# Patient Record
Sex: Female | Born: 1960
Health system: Southern US, Community
[De-identification: ages and names within clinical notes are randomized; demographics above are authoritative.]

## PROBLEM LIST (undated history)

## (undated) DIAGNOSIS — R0600 Dyspnea, unspecified: Secondary | ICD-10-CM

## (undated) DIAGNOSIS — E7800 Pure hypercholesterolemia, unspecified: Secondary | ICD-10-CM

## (undated) DIAGNOSIS — H269 Unspecified cataract: Secondary | ICD-10-CM

## (undated) DIAGNOSIS — I10 Essential (primary) hypertension: Secondary | ICD-10-CM

## (undated) DIAGNOSIS — E039 Hypothyroidism, unspecified: Secondary | ICD-10-CM

## (undated) DIAGNOSIS — N289 Disorder of kidney and ureter, unspecified: Secondary | ICD-10-CM

## (undated) DIAGNOSIS — E079 Disorder of thyroid, unspecified: Secondary | ICD-10-CM

## (undated) DIAGNOSIS — M1990 Unspecified osteoarthritis, unspecified site: Secondary | ICD-10-CM

## (undated) DIAGNOSIS — L4050 Arthropathic psoriasis, unspecified: Secondary | ICD-10-CM

## (undated) DIAGNOSIS — C801 Malignant (primary) neoplasm, unspecified: Secondary | ICD-10-CM

## (undated) DIAGNOSIS — E119 Type 2 diabetes mellitus without complications: Secondary | ICD-10-CM

## (undated) DIAGNOSIS — D649 Anemia, unspecified: Secondary | ICD-10-CM

## (undated) DIAGNOSIS — F319 Bipolar disorder, unspecified: Secondary | ICD-10-CM

## (undated) DIAGNOSIS — L409 Psoriasis, unspecified: Secondary | ICD-10-CM

## (undated) HISTORY — PX: OTHER SURGICAL HISTORY: SHX169

## (undated) HISTORY — PX: TONSILLECTOMY: SUR1361

---

## 2010-05-05 ENCOUNTER — Inpatient Hospital Stay: Payer: Self-pay | Admitting: Podiatry

## 2010-05-08 LAB — PATHOLOGY REPORT

## 2011-03-04 ENCOUNTER — Encounter: Payer: Self-pay | Admitting: Nurse Practitioner

## 2011-03-19 ENCOUNTER — Encounter: Payer: Self-pay | Admitting: Nurse Practitioner

## 2015-04-27 ENCOUNTER — Emergency Department
Admission: EM | Admit: 2015-04-27 | Discharge: 2015-04-27 | Disposition: A | Discharge status: Home / Self Care | Payer: Medicare Other | Attending: Emergency Medicine | Admitting: Emergency Medicine

## 2015-04-27 ENCOUNTER — Emergency Department: Payer: Medicare Other

## 2015-04-27 ENCOUNTER — Encounter: Payer: Self-pay | Admitting: *Deleted

## 2015-04-27 DIAGNOSIS — Z880 Allergy status to penicillin: Secondary | ICD-10-CM | POA: Diagnosis not present

## 2015-04-27 DIAGNOSIS — Z79899 Other long term (current) drug therapy: Secondary | ICD-10-CM | POA: Diagnosis not present

## 2015-04-27 DIAGNOSIS — M25562 Pain in left knee: Secondary | ICD-10-CM | POA: Diagnosis present

## 2015-04-27 DIAGNOSIS — M25462 Effusion, left knee: Secondary | ICD-10-CM | POA: Insufficient documentation

## 2015-04-27 DIAGNOSIS — I10 Essential (primary) hypertension: Secondary | ICD-10-CM | POA: Diagnosis not present

## 2015-04-27 DIAGNOSIS — E119 Type 2 diabetes mellitus without complications: Secondary | ICD-10-CM | POA: Diagnosis not present

## 2015-04-27 DIAGNOSIS — Z791 Long term (current) use of non-steroidal anti-inflammatories (NSAID): Secondary | ICD-10-CM | POA: Diagnosis not present

## 2015-04-27 HISTORY — DX: Psoriasis, unspecified: L40.9

## 2015-04-27 HISTORY — DX: Disorder of thyroid, unspecified: E07.9

## 2015-04-27 HISTORY — DX: Unspecified osteoarthritis, unspecified site: M19.90

## 2015-04-27 HISTORY — DX: Essential (primary) hypertension: I10

## 2015-04-27 HISTORY — DX: Type 2 diabetes mellitus without complications: E11.9

## 2015-04-27 HISTORY — DX: Bipolar disorder, unspecified: F31.9

## 2015-04-27 HISTORY — DX: Pure hypercholesterolemia, unspecified: E78.00

## 2015-04-27 MED ORDER — KETOROLAC TROMETHAMINE 60 MG/2ML IM SOLN
60.0000 mg | Freq: Once | INTRAMUSCULAR | Status: AC
  Administered 2015-04-27: 60 mg via INTRAMUSCULAR

## 2015-04-27 MED ORDER — KETOROLAC TROMETHAMINE 60 MG/2ML IM SOLN
INTRAMUSCULAR | Status: AC
  Administered 2015-04-27: 60 mg via INTRAMUSCULAR
  Filled 2015-04-27: qty 2

## 2015-04-27 MED ORDER — OXYCODONE-ACETAMINOPHEN 5-325 MG PO TABS
1.0000 | ORAL_TABLET | Freq: Once | ORAL | Status: AC
  Administered 2015-04-27: 1 via ORAL

## 2015-04-27 MED ORDER — OXYCODONE-ACETAMINOPHEN 5-325 MG PO TABS
ORAL_TABLET | ORAL | Status: AC
  Filled 2015-04-27: qty 1

## 2015-04-27 MED ORDER — ETODOLAC 200 MG PO CAPS: 200.0000 mg | ORAL_CAPSULE | Freq: Three times a day (TID) | ORAL | Status: DC

## 2015-04-27 NOTE — ED Notes (Signed)
Pt c/o exacerbation of chronic knee pain.

## 2015-04-27 NOTE — Discharge Instructions (Signed)
Knee Effusion The medical term for having fluid in your knee is effusion. This is often due to an internal derangement of the knee. This means something is wrong inside the knee. Some of the causes of fluid in the knee may be torn cartilage, a torn ligament, or bleeding into the joint from an injury. Your knee is likely more difficult to bend and move. This is often because there is increased pain and pressure in the joint. The time it takes for recovery from a knee effusion depends on different factors, including:   Type of injury.  Your age.  Physical and medical conditions.  Rehabilitation Strategies. How long you will be away from your normal activities will depend on what kind of knee problem you have and how much damage is present. Your knee has two types of cartilage. Articular cartilage covers the bone ends and lets your knee bend and move smoothly. Two menisci, thick pads of cartilage that form a rim inside the joint, help absorb shock and stabilize your knee. Ligaments bind the bones together and support your knee joint. Muscles move the joint, help support your knee, and take stress off the joint itself. CAUSES  Often an effusion in the knee is caused by an injury to one of the menisci. This is often a tear in the cartilage. Recovery after a meniscus injury depends on how much meniscus is damaged and whether you have damaged other knee tissue. Small tears may heal on their own with conservative treatment. Conservative means rest, limited weight bearing activity and muscle strengthening exercises. Your recovery may take up to 6 weeks.  TREATMENT  Larger tears may require surgery. Meniscus injuries may be treated during arthroscopy. Arthroscopy is a procedure in which your surgeon uses a small telescope like instrument to look in your knee. Your caregiver can make a more accurate diagnosis (learning what is wrong) by performing an arthroscopic procedure. If your injury is on the inner margin  of the meniscus, your surgeon may trim the meniscus back to a smooth rim. In other cases your surgeon will try to repair a damaged meniscus with stitches (sutures). This may make rehabilitation take longer, but may provide better long term result by helping your knee keep its shock absorption capabilities. Ligaments which are completely torn usually require surgery for repair. HOME CARE INSTRUCTIONS  Use crutches as instructed.  If a brace is applied, use as directed.  Once you are home, an ice pack applied to your swollen knee may help with discomfort and help decrease swelling.  Keep your knee raised (elevated) when you are not up and around or on crutches.  Only take over-the-counter or prescription medicines for pain, discomfort, or fever as directed by your caregiver.  Your caregivers will help with instructions for rehabilitation of your knee. This often includes strengthening exercises.  You may resume a normal diet and activities as directed. SEEK MEDICAL CARE IF:   There is increased swelling in your knee.  You notice redness, swelling, or increasing pain in your knee.  An unexplained oral temperature above 102 F (38.9 C) develops. SEEK IMMEDIATE MEDICAL CARE IF:   You develop a rash.  You have difficulty breathing.  You have any allergic reactions from medications you may have been given.  There is severe pain with any motion of the knee. MAKE SURE YOU:   Understand these instructions.  Will watch your condition.  Will get help right away if you are not doing well or get worse.  Document Released: 12/25/2003 Document Revised: 12/27/2011 Document Reviewed: 02/28/2008 [facility] Patient Information 2015 [place], [place]. This information is not intended to replace advice given to you by your health care provider. Make sure you discuss any questions you have with your health care provider.  Knee Pain The knee is the complex joint between your thigh and your lower leg.  It is made up of bones, tendons, ligaments, and cartilage. The bones that make up the knee are:  The femur in the thigh.  The tibia and fibula in the lower leg.  The patella or kneecap riding in the groove on the lower femur. CAUSES  Knee pain is a common complaint with many causes. A few of these causes are:  Injury, such as:  A ruptured ligament or tendon injury.  Torn cartilage.  Medical conditions, such as:  Gout  Arthritis  Infections  Overuse, over training, or overdoing a physical activity. Knee pain can be minor or severe. Knee pain can accompany debilitating injury. Minor knee problems often respond well to self-care measures or get well on their own. More serious injuries may need medical intervention or even surgery. SYMPTOMS The knee is complex. Symptoms of knee problems can vary widely. Some of the problems are:  Pain with movement and weight bearing.  Swelling and tenderness.  Buckling of the knee.  Inability to straighten or extend your knee.  Your knee locks and you cannot straighten it.  Warmth and redness with pain and fever.  Deformity or dislocation of the kneecap. DIAGNOSIS  Determining what is wrong may be very straight forward such as when there is an injury. It can also be challenging because of the complexity of the knee. Tests to make a diagnosis may include:  Your caregiver taking a history and doing a physical exam.  Routine X-rays can be used to rule out other problems. X-rays will not reveal a cartilage tear. Some injuries of the knee can be diagnosed by:  Arthroscopy a surgical technique by which a small video camera is inserted through tiny incisions on the sides of the knee. This procedure is used to examine and repair internal knee joint problems. Tiny instruments can be used during arthroscopy to repair the torn knee cartilage (meniscus).  Arthrography is a radiology technique. A contrast liquid is directly injected into the knee  joint. Internal structures of the knee joint then become visible on X-ray film.  An MRI scan is a non X-ray radiology procedure in which magnetic fields and a computer produce two- or three-dimensional images of the inside of the knee. Cartilage tears are often visible using an MRI scanner. MRI scans have largely replaced arthrography in diagnosing cartilage tears of the knee.  Blood work.  Examination of the fluid that helps to lubricate the knee joint (synovial fluid). This is done by taking a sample out using a needle and a syringe. TREATMENT The treatment of knee problems depends on the cause. Some of these treatments are:  Depending on the injury, proper casting, splinting, surgery, or physical therapy care will be needed.  Give yourself adequate recovery time. Do not overuse your joints. If you begin to get sore during workout routines, back off. Slow down or do fewer repetitions.  For repetitive activities such as cycling or running, maintain your strength and nutrition.  Alternate muscle groups. For example, if you are a weight lifter, work the upper body on one day and the lower body the next.  Either tight or weak muscles do not  give the proper support for your knee. Tight or weak muscles do not absorb the stress placed on the knee joint. Keep the muscles surrounding the knee strong.  Take care of mechanical problems.  If you have flat feet, orthotics or special shoes may help. See your caregiver if you need help.  Arch supports, sometimes with wedges on the inner or outer aspect of the heel, can help. These can shift pressure away from the side of the knee most bothered by osteoarthritis.  A brace called an "unloader" brace also may be used to help ease the pressure on the most arthritic side of the knee.  If your caregiver has prescribed crutches, braces, wraps or ice, use as directed. The acronym for this is PRICE. This means protection, rest, ice, compression, and  elevation.  Nonsteroidal anti-inflammatory drugs (NSAIDs), can help relieve pain. But if taken immediately after an injury, they may actually increase swelling. Take NSAIDs with food in your stomach. Stop them if you develop stomach problems. Do not take these if you have a history of ulcers, stomach pain, or bleeding from the bowel. Do not take without your caregiver's approval if you have problems with fluid retention, heart failure, or kidney problems.  For ongoing knee problems, physical therapy may be helpful.  Glucosamine and chondroitin are over-the-counter dietary supplements. Both may help relieve the pain of osteoarthritis in the knee. These medicines are different from the usual anti-inflammatory drugs. Glucosamine may decrease the rate of cartilage destruction.  Injections of a corticosteroid drug into your knee joint may help reduce the symptoms of an arthritis flare-up. They may provide pain relief that lasts a few months. You may have to wait a few months between injections. The injections do have a small increased risk of infection, water retention, and elevated blood sugar levels.  Hyaluronic acid injected into damaged joints may ease pain and provide lubrication. These injections may work by reducing inflammation. A series of shots may give relief for as long as 6 months.  Topical painkillers. Applying certain ointments to your skin may help relieve the pain and stiffness of osteoarthritis. Ask your pharmacist for suggestions. Many over the-counter products are approved for temporary relief of arthritis pain.  In some countries, doctors often prescribe topical NSAIDs for relief of chronic conditions such as arthritis and tendinitis. A review of treatment with NSAID creams found that they worked as well as oral medications but without the serious side effects. PREVENTION  Maintain a healthy weight. Extra pounds put more strain on your joints.  Get strong, stay limber. Weak muscles  are a common cause of knee injuries. Stretching is important. Include flexibility exercises in your workouts.  Be smart about exercise. If you have osteoarthritis, chronic knee pain or recurring injuries, you may need to change the way you exercise. This does not mean you have to stop being active. If your knees ache after jogging or playing basketball, consider switching to swimming, water aerobics, or other low-impact activities, at least for a few days a week. Sometimes limiting high-impact activities will provide relief.  Make sure your shoes fit well. Choose footwear that is right for your sport.  Protect your knees. Use the proper gear for knee-sensitive activities. Use kneepads when playing volleyball or laying carpet. Buckle your seat belt every time you drive. Most shattered kneecaps occur in car accidents.  Rest when you are tired. SEEK MEDICAL CARE IF:  You have knee pain that is continual and does not seem to be  getting better.  SEEK IMMEDIATE MEDICAL CARE IF:  Your knee joint feels hot to the touch and you have a high fever. MAKE SURE YOU:   Understand these instructions.  Will watch your condition.  Will get help right away if you are not doing well or get worse. Document Released: 08/01/2007 Document Revised: 12/27/2011 Document Reviewed: 08/01/2007 [facility] Patient Information 2015 [place], [place]. This information is not intended to replace advice given to you by your health care provider. Make sure you discuss any questions you have with your health care provider.

## 2015-04-27 NOTE — ED Notes (Signed)
No acute distress at discharge.  Pt advised about pain management and prescription for pain medication.  PT verbalized understanding.

## 2015-04-27 NOTE — ED Provider Notes (Signed)
[facility] Emergency Department Provider Note  ____________________________________________  Time seen: Approximately 6:07 AM  I have reviewed the triage vital signs and the nursing notes.   HISTORY  Chief Complaint Knee Pain    HPI Stacey Ball is a 54 y.o. female who comes in with left knee pain. The patient reports that her knee Dislocates often. She reports it goes out of place often for several months. She reports that yesterday when out of place and she was unable to put it back into place. The patient reports that the pain in her knee is approximately a 5 out of 10 in intensity. She reports that she has psoriatic arthritis and some problems with her cartilage. She reports that she went to the orthopedic doctor approximately one month ago and was seen by the PA who told her that her knee was okay. She reports that she is unable to bear weight on that left knee and is having increasing pain. The patient came in for further evaluation of her knee pain.   Past Medical History  Diagnosis Date  . Arthritis   . Bipolar 1 disorder   . Thyroid disease   . Psoriasis   . Hypertension   . Diabetes mellitus without complication   . High cholesterol     There are no active problems to display for this patient.   History reviewed. No pertinent past surgical history.  Current Outpatient Rx  Name  Route  Sig  Dispense  Refill  . divalproex (DEPAKOTE) 500 MG DR tablet   Oral   Take 500 mg by mouth 2 (two) times daily.         Marland Kitchen levothyroxine (SYNTHROID, LEVOTHROID) 25 MCG tablet   Oral   Take 25 mcg by mouth daily before breakfast.         . metFORMIN (GLUCOPHAGE) 500 MG tablet   Oral   Take by mouth 3 (three) times daily.         . QUEtiapine (SEROQUEL) 300 MG tablet   Oral   Take 300 mg by mouth at bedtime.         Marland Kitchen etodolac (LODINE) 200 MG capsule   Oral   Take 1 capsule (200 mg total) by mouth every 8 (eight) hours.   30 capsule  0     Allergies Penicillins and Shellfish allergy  History reviewed. No pertinent family history.  Social History History  Substance Use Topics  . Smoking status: Passive Smoke Exposure - Never Smoker  . Smokeless tobacco: Never Used  . Alcohol Use: No    Review of Systems Constitutional: No fever/chills Eyes: No visual changes. ENT: No sore throat. Cardiovascular: Denies chest pain. Respiratory: Denies shortness of breath. Gastrointestinal: No abdominal pain.  No nausea, no vomiting.  No diarrhea.  No constipation. Genitourinary: Negative for dysuria. Musculoskeletal: Left knee pain and swelling Skin: Negative for rash. Neurological: Negative for headaches, focal weakness or numbness.  10-point ROS otherwise negative.  ____________________________________________   PHYSICAL EXAM:  VITAL SIGNS: ED Triage Vitals  Enc Vitals Group     BP 04/27/15 0130 144/82 mmHg     Pulse Rate 04/27/15 0130 85     Resp 04/27/15 0130 18     Temp 04/27/15 0130 98 F (36.7 C)     Temp Source 04/27/15 0130 Oral     SpO2 04/27/15 0130 100 %     Weight 04/27/15 0130 180 lb (81.647 kg)     Height 04/27/15 0130 5'  7" (1.702 m)     Head Cir --      Peak Flow --      Pain Score 04/27/15 0134 5     Pain Loc --      Pain Edu? --      Excl. in [place]? --     Constitutional: Alert and oriented. Well appearing and in moderate distress. Eyes: Conjunctivae are normal. PERRL. EOMI. Head: Atraumatic. Nose: No congestion/rhinnorhea. Mouth/Throat: Mucous membranes are moist.  Oropharynx non-erythematous. Cardiovascular: Normal rate, regular rhythm. Grossly normal heart sounds.  Good peripheral circulation. Respiratory: Normal respiratory effort.  No retractions. Lungs CTAB. Gastrointestinal: Soft and nontender. No distention. Positive bowel sounds Genitourinary: Deferred Musculoskeletal: Left knee pain with a moderate joint effusion. The patient has pain with valgus and varus stressing with no  instability. Neurologic:  Normal speech and language. No gross focal neurologic deficits are appreciated.  Skin:  Skin is warm, dry and intact. No rash noted. Psychiatric: Mood and affect are normal.   ____________________________________________   LABS (all labs ordered are listed, but only abnormal results are displayed)  Labs Reviewed - No data to display ____________________________________________  EKG  None ____________________________________________  RADIOLOGY  Left knee x-ray: Moderate suprapatellar joint effusion without fracture deformity or dislocation, mild medial compartment osteoarthritis ____________________________________________   PROCEDURES  Procedure(s) performed: None  Critical Care performed: No  ____________________________________________   INITIAL IMPRESSION / ASSESSMENT AND PLAN / ED COURSE  Pertinent labs & imaging results that were available during my care of the patient were reviewed by me and considered in my medical decision making (see chart for details).  This is a 54 year old female who has a history of chronic knee pain and psoriatic arthritis who comes in with some knee pain and swelling today. I did do an x-ray looking for a possible patellar dislocation which was not found on x-ray. The patient does have some effusion and osteoarthrosis which may be causing her pain. The patient received a dose of Percocet and a shot of Toradol. I will give her some etodolac for home and have her follow back up with orthopedic surgery. The patient agrees with this plan as stated. ____________________________________________   FINAL CLINICAL IMPRESSION(S) / ED DIAGNOSES  Final diagnoses:  Knee pain, acute, left  Knee effusion, left      Loney Hering, MD 04/27/15 [id number]

## 2015-04-27 NOTE — ED Notes (Signed)
Pt reports pain to left knee, r/t psoriatic arthritis.  Pt reports hx subluxation of the patella to the left knee.  Pt reports yesterday patella came out of place about 6 hours ago and won't go back in place like before.  Pt states she is unable to walk and not able to bear much weight.  Pt NAD at this time.

## 2016-03-03 ENCOUNTER — Encounter: Payer: Self-pay | Admitting: *Deleted

## 2016-03-04 ENCOUNTER — Encounter
Admission: RE | Disposition: A | Discharge status: Home / Self Care | Payer: Self-pay | Source: Ambulatory Visit | Attending: Unknown Physician Specialty

## 2016-03-04 ENCOUNTER — Ambulatory Visit: Payer: Medicare Other | Admitting: Anesthesiology

## 2016-03-04 ENCOUNTER — Ambulatory Visit
Admission: RE | Admit: 2016-03-04 | Discharge: 2016-03-04 | Disposition: A | Discharge status: Home / Self Care | Payer: Medicare Other | Source: Ambulatory Visit | Attending: Unknown Physician Specialty | Admitting: Unknown Physician Specialty

## 2016-03-04 DIAGNOSIS — I10 Essential (primary) hypertension: Secondary | ICD-10-CM | POA: Diagnosis not present

## 2016-03-04 DIAGNOSIS — M1990 Unspecified osteoarthritis, unspecified site: Secondary | ICD-10-CM | POA: Diagnosis not present

## 2016-03-04 DIAGNOSIS — L409 Psoriasis, unspecified: Secondary | ICD-10-CM | POA: Diagnosis not present

## 2016-03-04 DIAGNOSIS — E7800 Pure hypercholesterolemia, unspecified: Secondary | ICD-10-CM | POA: Diagnosis not present

## 2016-03-04 DIAGNOSIS — K640 First degree hemorrhoids: Secondary | ICD-10-CM | POA: Insufficient documentation

## 2016-03-04 DIAGNOSIS — F319 Bipolar disorder, unspecified: Secondary | ICD-10-CM | POA: Diagnosis not present

## 2016-03-04 DIAGNOSIS — Z79899 Other long term (current) drug therapy: Secondary | ICD-10-CM | POA: Diagnosis not present

## 2016-03-04 DIAGNOSIS — K635 Polyp of colon: Secondary | ICD-10-CM | POA: Insufficient documentation

## 2016-03-04 DIAGNOSIS — Z1211 Encounter for screening for malignant neoplasm of colon: Secondary | ICD-10-CM | POA: Diagnosis not present

## 2016-03-04 DIAGNOSIS — Z7984 Long term (current) use of oral hypoglycemic drugs: Secondary | ICD-10-CM | POA: Diagnosis not present

## 2016-03-04 DIAGNOSIS — E119 Type 2 diabetes mellitus without complications: Secondary | ICD-10-CM | POA: Insufficient documentation

## 2016-03-04 DIAGNOSIS — E039 Hypothyroidism, unspecified: Secondary | ICD-10-CM | POA: Diagnosis not present

## 2016-03-04 HISTORY — DX: Unspecified cataract: H26.9

## 2016-03-04 HISTORY — DX: Hypothyroidism, unspecified: E03.9

## 2016-03-04 HISTORY — PX: COLONOSCOPY WITH PROPOFOL: SHX5780

## 2016-03-04 LAB — GLUCOSE, CAPILLARY: GLUCOSE-CAPILLARY: 120 mg/dL — AB (ref 65–99)

## 2016-03-04 SURGERY — COLONOSCOPY WITH PROPOFOL
Anesthesia: General

## 2016-03-04 MED ORDER — SODIUM CHLORIDE 0.9 % IV SOLN: INTRAVENOUS | Status: DC

## 2016-03-04 MED ORDER — PROPOFOL 10 MG/ML IV BOLUS
INTRAVENOUS | Status: DC | PRN
  Administered 2016-03-04: 50 mg via INTRAVENOUS
  Administered 2016-03-04: 10 mg via INTRAVENOUS

## 2016-03-04 MED ORDER — MIDAZOLAM HCL 5 MG/5ML IJ SOLN
INTRAMUSCULAR | Status: DC | PRN
  Administered 2016-03-04: 1 mg via INTRAVENOUS

## 2016-03-04 MED ORDER — SODIUM CHLORIDE 0.9 % IV SOLN
INTRAVENOUS | Status: DC
  Administered 2016-03-04: 1000 mL via INTRAVENOUS

## 2016-03-04 MED ORDER — LIDOCAINE HCL (PF) 2 % IJ SOLN
INTRAMUSCULAR | Status: DC | PRN
  Administered 2016-03-04: 60 mg

## 2016-03-04 MED ORDER — PROPOFOL 500 MG/50ML IV EMUL
INTRAVENOUS | Status: DC | PRN
  Administered 2016-03-04: 10 ug/kg/min via INTRAVENOUS

## 2016-03-04 MED ORDER — FENTANYL CITRATE (PF) 100 MCG/2ML IJ SOLN
INTRAMUSCULAR | Status: DC | PRN
  Administered 2016-03-04: 50 ug via INTRAVENOUS

## 2016-03-04 NOTE — Op Note (Signed)
[facility] Gastroenterology Patient Name: Stacey Ball Procedure Date: 03/04/2016 2:43 PM MRN: [id number] Account #: [id number] Date of Birth: [birth year] Admit Type: Outpatient Age: 55 Room: [facility] ENDO ROOM 1 Gender: Female Note Status: Finalized Procedure:            Colonoscopy Indications:          Screening for colorectal malignant neoplasm Providers:            Manya Silvas, MD Referring MD:         Dion Body (Referring MD) Medicines:            Propofol per Anesthesia Complications:        No immediate complications. Procedure:            Pre-Anesthesia Assessment:                       - After reviewing the risks and benefits, the patient                        was deemed in satisfactory condition to undergo the                        procedure.                       After obtaining informed consent, the colonoscope was                        passed under direct vision. Throughout the procedure,                        the patient's blood pressure, pulse, and oxygen                        saturations were monitored continuously. The                        Colonoscope was introduced through the anus and                        advanced to the the cecum, identified by appendiceal                        orifice and ileocecal valve. The colonoscopy was                        performed without difficulty. The patient tolerated the                        procedure well. The quality of the bowel preparation                        was good. Findings:      A diminutive polyp was found in the sigmoid colon. The polyp was       sessile. The polyp was removed with a cold snare. Resection and       retrieval were complete.      Internal hemorrhoids were found during endoscopy. The hemorrhoids were       small and Grade I (internal hemorrhoids that do not prolapse).      The exam was otherwise without  abnormality. Impression:           - One diminutive  polyp in the sigmoid colon, removed                        with a cold snare. Resected and retrieved.                       - Internal hemorrhoids.                       - The examination was otherwise normal. Recommendation:       - Await pathology results. Manya Silvas, MD 03/04/2016 3:29:26 PM This report has been signed electronically. Number of Addenda: 0 Note Initiated On: 03/04/2016 2:43 PM Scope Withdrawal Time: 0 hours 11 minutes 12 seconds  Total Procedure Duration: 0 hours 26 minutes 37 seconds       [facility]

## 2016-03-04 NOTE — H&P (Signed)
Primary Care Physician:  Dion Body, MD Primary Gastroenterologist:  Dr. Vira Agar  Pre-Procedure History & Physical: HPI:  Stacey Ball is a 55 y.o. female is here for an colonoscopy.   Past Medical History  Diagnosis Date  . Arthritis   . Bipolar 1 disorder ([place])   . Thyroid disease   . Psoriasis   . Hypertension   . Diabetes mellitus without complication ([place])   . High cholesterol   . Hypothyroidism   . Cataracts, bilateral     Past Surgical History  Procedure Laterality Date  . Tonsillectomy    . Matrixectomy    . Great toe amputation      Prior to Admission medications   Medication Sig Start Date End Date Taking? Authorizing Provider  clonazePAM (KLONOPIN) 2 MG tablet Take 2 mg by mouth 2 (two) times daily.   Yes Historical Provider, MD  diclofenac sodium (VOLTAREN) 1 % GEL Apply topically 4 (four) times daily.   Yes Historical Provider, MD  divalproex (DEPAKOTE) 500 MG DR tablet Take 500 mg by mouth 2 (two) times daily.   Yes Historical Provider, MD  etodolac (LODINE) 200 MG capsule Take 1 capsule (200 mg total) by mouth every 8 (eight) hours. 04/27/15  Yes Loney Hering, MD  fluocinonide ointment (LIDEX) [id number] % Apply 1 application topically 2 (two) times daily.   Yes Historical Provider, MD  folic acid (FOLVITE) 1 MG tablet Take 1 mg by mouth daily.   Yes Historical Provider, MD  levothyroxine (SYNTHROID, LEVOTHROID) 25 MCG tablet Take 25 mcg by mouth daily before breakfast.   Yes Historical Provider, MD  losartan (COZAAR) 25 MG tablet Take 25 mg by mouth daily.   Yes Historical Provider, MD  traMADol (ULTRAM) 50 MG tablet Take by mouth every 6 (six) hours as needed.   Yes Historical Provider, MD  metFORMIN (GLUCOPHAGE) 500 MG tablet Take by mouth 3 (three) times daily.    Historical Provider, MD  QUEtiapine (SEROQUEL) 300 MG tablet Take 300 mg by mouth at bedtime.    Historical Provider, MD    Allergies as of 02/26/2016 - Review Complete 04/27/2015   Allergen Reaction Noted  . Penicillins  04/27/2015  . Shellfish allergy  04/27/2015    No family history on file.  Social History   Social History  . Marital Status: Married    Spouse Name: N/A  . Number of Children: N/A  . Years of Education: N/A   Occupational History  . Not on file.   Social History Main Topics  . Smoking status: Passive Smoke Exposure - Never Smoker  . Smokeless tobacco: Never Used  . Alcohol Use: No  . Drug Use: No  . Sexual Activity: No   Other Topics Concern  . Not on file   Social History Narrative    Review of Systems: See HPI, otherwise negative ROS  Physical Exam: BP 141/86 mmHg  Pulse 91  Temp(Src) 98.5 F (36.9 C) (Tympanic)  Ht 5\' 7"  (1.702 m)  Wt 81.647 kg (180 lb)  BMI 28.19 kg/m2  SpO2 100% General:   Alert,  pleasant and cooperative in NAD Head:  Normocephalic and atraumatic. Neck:  Supple; no masses or thyromegaly. Lungs:  Clear throughout to auscultation.    Heart:  Regular rate and rhythm. Abdomen:  Soft, nontender and nondistended. Normal bowel sounds, without guarding, and without rebound.   Neurologic:  Alert and  oriented x4;  grossly normal neurologically.  Impression/Plan: Stacey Ball is here for  an colonoscopy to be performed for screening colonoscopy  Risks, benefits, limitations, and alternatives regarding  colonoscopy have been reviewed with the patient.  Questions have been answered.  All parties agreeable.   Gaylyn Cheers, MD  03/04/2016, 2:53 PM

## 2016-03-04 NOTE — Transfer of Care (Signed)
Immediate Anesthesia Transfer of Care Note  Patient: Stacey Ball  Procedure(s) Performed: Procedure(s): COLONOSCOPY WITH PROPOFOL (N/A)  Patient Location: PACU  Anesthesia Type:General  Level of Consciousness: awake and patient cooperative  Airway & Oxygen Therapy: Patient Spontanous Breathing and Patient connected to nasal cannula oxygen  Post-op Assessment: Report given to RN  Post vital signs: Reviewed and stable  Last Vitals:  Filed Vitals:   03/04/16 1410 03/04/16 1533  BP: 141/86 143/87  Pulse: 91 86  Temp: 36.9 C 97.22F  Resp:  22    Last Pain: There were no vitals filed for this visit.       Complications: No apparent anesthesia complications

## 2016-03-04 NOTE — Anesthesia Preprocedure Evaluation (Addendum)
Anesthesia Evaluation  Patient identified by MRN, date of birth, ID band Patient awake    Reviewed: Allergy & Precautions, H&P , NPO status , Patient's Chart, lab work & pertinent test results, reviewed documented beta blocker date and time   Airway Mallampati: II   Neck ROM: full    Dental  (+) Poor Dentition   Pulmonary neg pulmonary ROS,    Pulmonary exam normal        Cardiovascular hypertension, negative cardio ROS Normal cardiovascular exam     Neuro/Psych negative neurological ROS  negative psych ROS   GI/Hepatic negative GI ROS, Neg liver ROS,   Endo/Other  negative endocrine ROSdiabetes  Renal/GU negative Renal ROS  negative genitourinary   Musculoskeletal   Abdominal   Peds  Hematology negative hematology ROS (+)   Anesthesia Other Findings Past Medical History:   Arthritis                                                    Bipolar 1 disorder ([place])                                     Thyroid disease                                              Psoriasis                                                    Hypertension                                                 Diabetes mellitus without complication (HCC)                 High cholesterol                                             Hypothyroidism                                               Cataracts, bilateral                                       Past Surgical History:   TONSILLECTOMY                                                 matrixectomy  great toe amputation                                          COLONOSCOPY WITH PROPOFOL                       N/A 03/04/2016      Comment:Procedure: COLONOSCOPY WITH PROPOFOL;  Surgeon:              Manya Silvas, MD;  Location: [facility]               ENDOSCOPY;  Service: Endoscopy;  Laterality:               N/A; BMI    Body Mass Index   28.18 kg/m 2     Reproductive/Obstetrics                             Anesthesia Physical Anesthesia Plan  ASA: III  Anesthesia Plan: General   Post-op Pain Management:    Induction:   Airway Management Planned:   Additional Equipment:   Intra-op Plan:   Post-operative Plan:   Informed Consent: I have reviewed the patients History and Physical, chart, labs and discussed the procedure including the risks, benefits and alternatives for the proposed anesthesia with the patient or authorized representative who has indicated his/her understanding and acceptance.   Dental Advisory Given  Plan Discussed with: CRNA  Anesthesia Plan Comments:         Anesthesia Quick Evaluation

## 2016-03-08 LAB — SURGICAL PATHOLOGY

## 2016-03-09 ENCOUNTER — Encounter: Payer: Self-pay | Admitting: Unknown Physician Specialty

## 2016-03-10 NOTE — Anesthesia Postprocedure Evaluation (Signed)
Anesthesia Post Note  Patient: Stacey Ball  Procedure(s) Performed: Procedure(s) (LRB): COLONOSCOPY WITH PROPOFOL (N/A)  Patient location during evaluation: PACU Anesthesia Type: General Level of consciousness: awake Pain management: pain level controlled Vital Signs Assessment: post-procedure vital signs reviewed and stable Respiratory status: spontaneous breathing Cardiovascular status: blood pressure returned to baseline Anesthetic complications: no    Last Vitals:  Filed Vitals:   03/04/16 1550 03/04/16 1600  BP: 174/99 156/90  Pulse: 95 94  Temp:    Resp: 16 19    Last Pain:  Filed Vitals:   03/05/16 0756  PainSc: 0-No pain                 VAN STAVEREN,Amily Depp

## 2016-03-17 DIAGNOSIS — I10 Essential (primary) hypertension: Secondary | ICD-10-CM | POA: Diagnosis present

## 2016-03-22 DIAGNOSIS — E781 Pure hyperglyceridemia: Secondary | ICD-10-CM | POA: Insufficient documentation

## 2016-10-04 DIAGNOSIS — N183 Chronic kidney disease, stage 3 unspecified: Secondary | ICD-10-CM | POA: Insufficient documentation

## 2016-10-20 DIAGNOSIS — N183 Chronic kidney disease, stage 3 (moderate): Secondary | ICD-10-CM | POA: Diagnosis not present

## 2016-11-15 DIAGNOSIS — E1122 Type 2 diabetes mellitus with diabetic chronic kidney disease: Secondary | ICD-10-CM | POA: Diagnosis not present

## 2016-11-15 DIAGNOSIS — E785 Hyperlipidemia, unspecified: Secondary | ICD-10-CM | POA: Diagnosis not present

## 2016-11-15 DIAGNOSIS — N183 Chronic kidney disease, stage 3 (moderate): Secondary | ICD-10-CM | POA: Diagnosis not present

## 2016-11-15 DIAGNOSIS — I129 Hypertensive chronic kidney disease with stage 1 through stage 4 chronic kidney disease, or unspecified chronic kidney disease: Secondary | ICD-10-CM | POA: Diagnosis not present

## 2017-01-04 ENCOUNTER — Other Ambulatory Visit: Payer: Self-pay | Admitting: Nephrology

## 2017-01-04 DIAGNOSIS — N183 Chronic kidney disease, stage 3 unspecified: Secondary | ICD-10-CM

## 2017-01-05 DIAGNOSIS — E1142 Type 2 diabetes mellitus with diabetic polyneuropathy: Secondary | ICD-10-CM | POA: Diagnosis not present

## 2017-01-10 ENCOUNTER — Ambulatory Visit
Admission: RE | Admit: 2017-01-10 | Discharge: 2017-01-10 | Disposition: A | Discharge status: Home / Self Care | Payer: PPO | Source: Ambulatory Visit | Attending: Nephrology | Admitting: Nephrology

## 2017-01-10 DIAGNOSIS — N281 Cyst of kidney, acquired: Secondary | ICD-10-CM | POA: Insufficient documentation

## 2017-01-10 DIAGNOSIS — N183 Chronic kidney disease, stage 3 unspecified: Secondary | ICD-10-CM

## 2017-01-11 DIAGNOSIS — E1122 Type 2 diabetes mellitus with diabetic chronic kidney disease: Secondary | ICD-10-CM | POA: Diagnosis not present

## 2017-01-11 DIAGNOSIS — N183 Chronic kidney disease, stage 3 (moderate): Secondary | ICD-10-CM | POA: Diagnosis not present

## 2017-01-11 DIAGNOSIS — I10 Essential (primary) hypertension: Secondary | ICD-10-CM | POA: Diagnosis not present

## 2017-01-11 DIAGNOSIS — E039 Hypothyroidism, unspecified: Secondary | ICD-10-CM | POA: Diagnosis not present

## 2017-01-11 DIAGNOSIS — E1142 Type 2 diabetes mellitus with diabetic polyneuropathy: Secondary | ICD-10-CM | POA: Diagnosis not present

## 2017-01-12 DIAGNOSIS — N179 Acute kidney failure, unspecified: Secondary | ICD-10-CM | POA: Diagnosis not present

## 2017-01-12 DIAGNOSIS — I10 Essential (primary) hypertension: Secondary | ICD-10-CM | POA: Diagnosis not present

## 2017-01-12 DIAGNOSIS — E1129 Type 2 diabetes mellitus with other diabetic kidney complication: Secondary | ICD-10-CM | POA: Diagnosis not present

## 2017-01-12 DIAGNOSIS — E559 Vitamin D deficiency, unspecified: Secondary | ICD-10-CM | POA: Diagnosis not present

## 2017-02-09 DIAGNOSIS — N183 Chronic kidney disease, stage 3 (moderate): Secondary | ICD-10-CM | POA: Diagnosis not present

## 2017-02-09 DIAGNOSIS — I10 Essential (primary) hypertension: Secondary | ICD-10-CM | POA: Diagnosis not present

## 2017-02-09 DIAGNOSIS — E781 Pure hyperglyceridemia: Secondary | ICD-10-CM | POA: Diagnosis not present

## 2017-02-09 DIAGNOSIS — Z79899 Other long term (current) drug therapy: Secondary | ICD-10-CM | POA: Diagnosis not present

## 2017-02-10 DIAGNOSIS — Z79899 Other long term (current) drug therapy: Secondary | ICD-10-CM | POA: Diagnosis not present

## 2017-02-10 DIAGNOSIS — E781 Pure hyperglyceridemia: Secondary | ICD-10-CM | POA: Diagnosis not present

## 2017-02-10 DIAGNOSIS — I10 Essential (primary) hypertension: Secondary | ICD-10-CM | POA: Diagnosis not present

## 2017-02-10 DIAGNOSIS — N183 Chronic kidney disease, stage 3 (moderate): Secondary | ICD-10-CM | POA: Diagnosis not present

## 2017-04-14 DIAGNOSIS — H2513 Age-related nuclear cataract, bilateral: Secondary | ICD-10-CM | POA: Diagnosis not present

## 2017-05-09 DIAGNOSIS — L97511 Non-pressure chronic ulcer of other part of right foot limited to breakdown of skin: Secondary | ICD-10-CM | POA: Diagnosis not present

## 2017-05-11 DIAGNOSIS — E1142 Type 2 diabetes mellitus with diabetic polyneuropathy: Secondary | ICD-10-CM | POA: Diagnosis not present

## 2017-05-11 DIAGNOSIS — E039 Hypothyroidism, unspecified: Secondary | ICD-10-CM | POA: Diagnosis not present

## 2017-05-16 DIAGNOSIS — N183 Chronic kidney disease, stage 3 (moderate): Secondary | ICD-10-CM | POA: Diagnosis not present

## 2017-05-16 DIAGNOSIS — E1142 Type 2 diabetes mellitus with diabetic polyneuropathy: Secondary | ICD-10-CM | POA: Diagnosis not present

## 2017-05-16 DIAGNOSIS — I10 Essential (primary) hypertension: Secondary | ICD-10-CM | POA: Diagnosis not present

## 2017-05-16 DIAGNOSIS — E1122 Type 2 diabetes mellitus with diabetic chronic kidney disease: Secondary | ICD-10-CM | POA: Diagnosis not present

## 2017-05-16 DIAGNOSIS — E039 Hypothyroidism, unspecified: Secondary | ICD-10-CM | POA: Diagnosis not present

## 2017-06-08 DIAGNOSIS — I10 Essential (primary) hypertension: Secondary | ICD-10-CM | POA: Diagnosis not present

## 2017-06-08 DIAGNOSIS — E781 Pure hyperglyceridemia: Secondary | ICD-10-CM | POA: Diagnosis not present

## 2017-06-08 DIAGNOSIS — F319 Bipolar disorder, unspecified: Secondary | ICD-10-CM | POA: Diagnosis not present

## 2017-06-15 DIAGNOSIS — F250 Schizoaffective disorder, bipolar type: Secondary | ICD-10-CM | POA: Diagnosis not present

## 2017-07-20 DIAGNOSIS — N183 Chronic kidney disease, stage 3 (moderate): Secondary | ICD-10-CM | POA: Diagnosis not present

## 2017-07-20 DIAGNOSIS — I129 Hypertensive chronic kidney disease with stage 1 through stage 4 chronic kidney disease, or unspecified chronic kidney disease: Secondary | ICD-10-CM | POA: Diagnosis not present

## 2017-07-20 DIAGNOSIS — N2581 Secondary hyperparathyroidism of renal origin: Secondary | ICD-10-CM | POA: Diagnosis not present

## 2017-07-20 DIAGNOSIS — E559 Vitamin D deficiency, unspecified: Secondary | ICD-10-CM | POA: Diagnosis not present

## 2017-07-20 DIAGNOSIS — E1122 Type 2 diabetes mellitus with diabetic chronic kidney disease: Secondary | ICD-10-CM | POA: Diagnosis not present

## 2017-07-25 DIAGNOSIS — E1122 Type 2 diabetes mellitus with diabetic chronic kidney disease: Secondary | ICD-10-CM | POA: Diagnosis not present

## 2017-07-25 DIAGNOSIS — N2581 Secondary hyperparathyroidism of renal origin: Secondary | ICD-10-CM | POA: Diagnosis not present

## 2017-07-25 DIAGNOSIS — E559 Vitamin D deficiency, unspecified: Secondary | ICD-10-CM | POA: Diagnosis not present

## 2017-07-25 DIAGNOSIS — I129 Hypertensive chronic kidney disease with stage 1 through stage 4 chronic kidney disease, or unspecified chronic kidney disease: Secondary | ICD-10-CM | POA: Diagnosis not present

## 2017-07-25 DIAGNOSIS — N183 Chronic kidney disease, stage 3 (moderate): Secondary | ICD-10-CM | POA: Diagnosis not present

## 2017-09-07 DIAGNOSIS — E1142 Type 2 diabetes mellitus with diabetic polyneuropathy: Secondary | ICD-10-CM | POA: Diagnosis not present

## 2017-09-16 DIAGNOSIS — N183 Chronic kidney disease, stage 3 unspecified: Secondary | ICD-10-CM | POA: Diagnosis present

## 2017-09-16 DIAGNOSIS — Z89421 Acquired absence of other right toe(s): Secondary | ICD-10-CM | POA: Diagnosis not present

## 2017-09-16 DIAGNOSIS — E039 Hypothyroidism, unspecified: Secondary | ICD-10-CM | POA: Diagnosis not present

## 2017-09-16 DIAGNOSIS — E1142 Type 2 diabetes mellitus with diabetic polyneuropathy: Secondary | ICD-10-CM | POA: Diagnosis not present

## 2017-09-16 DIAGNOSIS — E1122 Type 2 diabetes mellitus with diabetic chronic kidney disease: Secondary | ICD-10-CM | POA: Diagnosis not present

## 2017-09-16 DIAGNOSIS — E1165 Type 2 diabetes mellitus with hyperglycemia: Secondary | ICD-10-CM | POA: Diagnosis not present

## 2017-12-14 DIAGNOSIS — F250 Schizoaffective disorder, bipolar type: Secondary | ICD-10-CM | POA: Diagnosis not present

## 2018-01-11 DIAGNOSIS — E039 Hypothyroidism, unspecified: Secondary | ICD-10-CM | POA: Diagnosis not present

## 2018-01-11 DIAGNOSIS — E1142 Type 2 diabetes mellitus with diabetic polyneuropathy: Secondary | ICD-10-CM | POA: Diagnosis not present

## 2018-01-18 DIAGNOSIS — I10 Essential (primary) hypertension: Secondary | ICD-10-CM | POA: Diagnosis not present

## 2018-01-18 DIAGNOSIS — N183 Chronic kidney disease, stage 3 (moderate): Secondary | ICD-10-CM | POA: Diagnosis not present

## 2018-01-18 DIAGNOSIS — E1122 Type 2 diabetes mellitus with diabetic chronic kidney disease: Secondary | ICD-10-CM | POA: Diagnosis not present

## 2018-01-18 DIAGNOSIS — E1142 Type 2 diabetes mellitus with diabetic polyneuropathy: Secondary | ICD-10-CM | POA: Diagnosis not present

## 2018-01-18 DIAGNOSIS — E039 Hypothyroidism, unspecified: Secondary | ICD-10-CM | POA: Diagnosis not present

## 2018-02-15 DIAGNOSIS — E781 Pure hyperglyceridemia: Secondary | ICD-10-CM | POA: Diagnosis not present

## 2018-02-15 DIAGNOSIS — F319 Bipolar disorder, unspecified: Secondary | ICD-10-CM | POA: Diagnosis not present

## 2018-02-15 DIAGNOSIS — I10 Essential (primary) hypertension: Secondary | ICD-10-CM | POA: Diagnosis not present

## 2018-02-20 DIAGNOSIS — E1122 Type 2 diabetes mellitus with diabetic chronic kidney disease: Secondary | ICD-10-CM | POA: Diagnosis not present

## 2018-02-20 DIAGNOSIS — I129 Hypertensive chronic kidney disease with stage 1 through stage 4 chronic kidney disease, or unspecified chronic kidney disease: Secondary | ICD-10-CM | POA: Diagnosis not present

## 2018-02-20 DIAGNOSIS — E871 Hypo-osmolality and hyponatremia: Secondary | ICD-10-CM | POA: Diagnosis not present

## 2018-02-20 DIAGNOSIS — N183 Chronic kidney disease, stage 3 (moderate): Secondary | ICD-10-CM | POA: Diagnosis not present

## 2018-02-22 DIAGNOSIS — Z0000 Encounter for general adult medical examination without abnormal findings: Secondary | ICD-10-CM | POA: Diagnosis not present

## 2018-02-22 DIAGNOSIS — E781 Pure hyperglyceridemia: Secondary | ICD-10-CM | POA: Diagnosis not present

## 2018-02-22 DIAGNOSIS — N183 Chronic kidney disease, stage 3 (moderate): Secondary | ICD-10-CM | POA: Diagnosis not present

## 2018-02-22 DIAGNOSIS — I10 Essential (primary) hypertension: Secondary | ICD-10-CM | POA: Diagnosis not present

## 2018-02-23 DIAGNOSIS — E871 Hypo-osmolality and hyponatremia: Secondary | ICD-10-CM | POA: Diagnosis not present

## 2018-02-23 DIAGNOSIS — N183 Chronic kidney disease, stage 3 (moderate): Secondary | ICD-10-CM | POA: Diagnosis not present

## 2018-02-23 DIAGNOSIS — E1122 Type 2 diabetes mellitus with diabetic chronic kidney disease: Secondary | ICD-10-CM | POA: Diagnosis not present

## 2018-02-23 DIAGNOSIS — I129 Hypertensive chronic kidney disease with stage 1 through stage 4 chronic kidney disease, or unspecified chronic kidney disease: Secondary | ICD-10-CM | POA: Diagnosis not present

## 2018-06-07 DIAGNOSIS — E1142 Type 2 diabetes mellitus with diabetic polyneuropathy: Secondary | ICD-10-CM | POA: Diagnosis not present

## 2018-06-07 DIAGNOSIS — E781 Pure hyperglyceridemia: Secondary | ICD-10-CM | POA: Diagnosis not present

## 2018-06-07 DIAGNOSIS — N183 Chronic kidney disease, stage 3 (moderate): Secondary | ICD-10-CM | POA: Diagnosis not present

## 2018-06-07 DIAGNOSIS — I10 Essential (primary) hypertension: Secondary | ICD-10-CM | POA: Diagnosis not present

## 2018-06-07 DIAGNOSIS — Z5181 Encounter for therapeutic drug level monitoring: Secondary | ICD-10-CM | POA: Diagnosis not present

## 2018-06-14 DIAGNOSIS — E781 Pure hyperglyceridemia: Secondary | ICD-10-CM | POA: Diagnosis not present

## 2018-06-14 DIAGNOSIS — Z0000 Encounter for general adult medical examination without abnormal findings: Secondary | ICD-10-CM | POA: Diagnosis not present

## 2018-06-14 DIAGNOSIS — I10 Essential (primary) hypertension: Secondary | ICD-10-CM | POA: Diagnosis not present

## 2018-06-14 DIAGNOSIS — N183 Chronic kidney disease, stage 3 (moderate): Secondary | ICD-10-CM | POA: Diagnosis not present

## 2018-06-28 DIAGNOSIS — I129 Hypertensive chronic kidney disease with stage 1 through stage 4 chronic kidney disease, or unspecified chronic kidney disease: Secondary | ICD-10-CM | POA: Diagnosis not present

## 2018-06-28 DIAGNOSIS — E1122 Type 2 diabetes mellitus with diabetic chronic kidney disease: Secondary | ICD-10-CM | POA: Diagnosis not present

## 2018-06-28 DIAGNOSIS — N183 Chronic kidney disease, stage 3 (moderate): Secondary | ICD-10-CM | POA: Diagnosis not present

## 2018-07-20 DIAGNOSIS — F250 Schizoaffective disorder, bipolar type: Secondary | ICD-10-CM | POA: Diagnosis not present

## 2018-10-17 DIAGNOSIS — E1142 Type 2 diabetes mellitus with diabetic polyneuropathy: Secondary | ICD-10-CM | POA: Diagnosis not present

## 2018-10-17 DIAGNOSIS — E1122 Type 2 diabetes mellitus with diabetic chronic kidney disease: Secondary | ICD-10-CM | POA: Diagnosis not present

## 2018-10-17 DIAGNOSIS — E669 Obesity, unspecified: Secondary | ICD-10-CM | POA: Diagnosis not present

## 2018-10-17 DIAGNOSIS — N183 Chronic kidney disease, stage 3 (moderate): Secondary | ICD-10-CM | POA: Diagnosis not present

## 2018-10-17 DIAGNOSIS — E039 Hypothyroidism, unspecified: Secondary | ICD-10-CM | POA: Diagnosis not present

## 2018-12-06 DIAGNOSIS — E781 Pure hyperglyceridemia: Secondary | ICD-10-CM | POA: Diagnosis not present

## 2018-12-06 DIAGNOSIS — N183 Chronic kidney disease, stage 3 (moderate): Secondary | ICD-10-CM | POA: Diagnosis not present

## 2018-12-06 DIAGNOSIS — I10 Essential (primary) hypertension: Secondary | ICD-10-CM | POA: Diagnosis not present

## 2018-12-13 DIAGNOSIS — D638 Anemia in other chronic diseases classified elsewhere: Secondary | ICD-10-CM | POA: Diagnosis not present

## 2018-12-13 DIAGNOSIS — E781 Pure hyperglyceridemia: Secondary | ICD-10-CM | POA: Diagnosis not present

## 2018-12-13 DIAGNOSIS — I10 Essential (primary) hypertension: Secondary | ICD-10-CM | POA: Diagnosis not present

## 2018-12-13 DIAGNOSIS — N183 Chronic kidney disease, stage 3 (moderate): Secondary | ICD-10-CM | POA: Diagnosis not present

## 2018-12-27 DIAGNOSIS — R809 Proteinuria, unspecified: Secondary | ICD-10-CM | POA: Diagnosis not present

## 2018-12-27 DIAGNOSIS — D631 Anemia in chronic kidney disease: Secondary | ICD-10-CM | POA: Diagnosis not present

## 2018-12-27 DIAGNOSIS — N2581 Secondary hyperparathyroidism of renal origin: Secondary | ICD-10-CM | POA: Diagnosis not present

## 2018-12-27 DIAGNOSIS — N183 Chronic kidney disease, stage 3 (moderate): Secondary | ICD-10-CM | POA: Diagnosis not present

## 2018-12-27 DIAGNOSIS — E1122 Type 2 diabetes mellitus with diabetic chronic kidney disease: Secondary | ICD-10-CM | POA: Diagnosis not present

## 2019-02-07 DIAGNOSIS — F250 Schizoaffective disorder, bipolar type: Secondary | ICD-10-CM | POA: Diagnosis not present

## 2019-03-09 DIAGNOSIS — E1122 Type 2 diabetes mellitus with diabetic chronic kidney disease: Secondary | ICD-10-CM | POA: Diagnosis not present

## 2019-03-09 DIAGNOSIS — E785 Hyperlipidemia, unspecified: Secondary | ICD-10-CM | POA: Diagnosis not present

## 2019-03-09 DIAGNOSIS — I129 Hypertensive chronic kidney disease with stage 1 through stage 4 chronic kidney disease, or unspecified chronic kidney disease: Secondary | ICD-10-CM | POA: Diagnosis not present

## 2019-03-09 DIAGNOSIS — N183 Chronic kidney disease, stage 3 (moderate): Secondary | ICD-10-CM | POA: Diagnosis not present

## 2019-05-02 DIAGNOSIS — E039 Hypothyroidism, unspecified: Secondary | ICD-10-CM | POA: Diagnosis not present

## 2019-05-02 DIAGNOSIS — E1122 Type 2 diabetes mellitus with diabetic chronic kidney disease: Secondary | ICD-10-CM | POA: Diagnosis not present

## 2019-05-02 DIAGNOSIS — N183 Chronic kidney disease, stage 3 (moderate): Secondary | ICD-10-CM | POA: Diagnosis not present

## 2019-05-03 DIAGNOSIS — E1122 Type 2 diabetes mellitus with diabetic chronic kidney disease: Secondary | ICD-10-CM | POA: Diagnosis not present

## 2019-05-03 DIAGNOSIS — E039 Hypothyroidism, unspecified: Secondary | ICD-10-CM | POA: Diagnosis not present

## 2019-05-03 DIAGNOSIS — N183 Chronic kidney disease, stage 3 (moderate): Secondary | ICD-10-CM | POA: Diagnosis not present

## 2019-05-09 DIAGNOSIS — E039 Hypothyroidism, unspecified: Secondary | ICD-10-CM | POA: Diagnosis not present

## 2019-05-09 DIAGNOSIS — N183 Chronic kidney disease, stage 3 (moderate): Secondary | ICD-10-CM | POA: Diagnosis not present

## 2019-05-09 DIAGNOSIS — E6609 Other obesity due to excess calories: Secondary | ICD-10-CM

## 2019-05-09 DIAGNOSIS — E1142 Type 2 diabetes mellitus with diabetic polyneuropathy: Secondary | ICD-10-CM | POA: Diagnosis not present

## 2019-05-09 DIAGNOSIS — E669 Obesity, unspecified: Secondary | ICD-10-CM | POA: Diagnosis not present

## 2019-05-09 DIAGNOSIS — E1122 Type 2 diabetes mellitus with diabetic chronic kidney disease: Secondary | ICD-10-CM | POA: Diagnosis not present

## 2019-06-06 DIAGNOSIS — E781 Pure hyperglyceridemia: Secondary | ICD-10-CM | POA: Diagnosis not present

## 2019-06-06 DIAGNOSIS — N183 Chronic kidney disease, stage 3 (moderate): Secondary | ICD-10-CM | POA: Diagnosis not present

## 2019-06-06 DIAGNOSIS — D638 Anemia in other chronic diseases classified elsewhere: Secondary | ICD-10-CM | POA: Diagnosis not present

## 2019-06-06 DIAGNOSIS — I10 Essential (primary) hypertension: Secondary | ICD-10-CM | POA: Diagnosis not present

## 2019-06-13 DIAGNOSIS — E781 Pure hyperglyceridemia: Secondary | ICD-10-CM | POA: Diagnosis not present

## 2019-06-13 DIAGNOSIS — Z0000 Encounter for general adult medical examination without abnormal findings: Secondary | ICD-10-CM | POA: Diagnosis not present

## 2019-06-13 DIAGNOSIS — I10 Essential (primary) hypertension: Secondary | ICD-10-CM | POA: Diagnosis not present

## 2019-06-13 DIAGNOSIS — D638 Anemia in other chronic diseases classified elsewhere: Secondary | ICD-10-CM | POA: Diagnosis not present

## 2019-07-11 DIAGNOSIS — I129 Hypertensive chronic kidney disease with stage 1 through stage 4 chronic kidney disease, or unspecified chronic kidney disease: Secondary | ICD-10-CM | POA: Diagnosis not present

## 2019-07-11 DIAGNOSIS — E1122 Type 2 diabetes mellitus with diabetic chronic kidney disease: Secondary | ICD-10-CM | POA: Diagnosis not present

## 2019-07-11 DIAGNOSIS — N183 Chronic kidney disease, stage 3 (moderate): Secondary | ICD-10-CM | POA: Diagnosis not present

## 2019-07-11 DIAGNOSIS — N2581 Secondary hyperparathyroidism of renal origin: Secondary | ICD-10-CM | POA: Diagnosis not present

## 2019-08-15 DIAGNOSIS — F250 Schizoaffective disorder, bipolar type: Secondary | ICD-10-CM | POA: Diagnosis not present

## 2019-08-21 DIAGNOSIS — H25013 Cortical age-related cataract, bilateral: Secondary | ICD-10-CM | POA: Diagnosis not present

## 2019-10-23 DIAGNOSIS — I10 Essential (primary) hypertension: Secondary | ICD-10-CM | POA: Diagnosis not present

## 2019-10-23 DIAGNOSIS — E1122 Type 2 diabetes mellitus with diabetic chronic kidney disease: Secondary | ICD-10-CM | POA: Diagnosis not present

## 2019-10-23 DIAGNOSIS — E039 Hypothyroidism, unspecified: Secondary | ICD-10-CM | POA: Diagnosis not present

## 2019-10-23 DIAGNOSIS — E1142 Type 2 diabetes mellitus with diabetic polyneuropathy: Secondary | ICD-10-CM | POA: Diagnosis not present

## 2019-10-23 DIAGNOSIS — N183 Chronic kidney disease, stage 3 unspecified: Secondary | ICD-10-CM | POA: Diagnosis not present

## 2019-10-23 DIAGNOSIS — D638 Anemia in other chronic diseases classified elsewhere: Secondary | ICD-10-CM | POA: Diagnosis not present

## 2019-10-23 DIAGNOSIS — E781 Pure hyperglyceridemia: Secondary | ICD-10-CM | POA: Diagnosis not present

## 2019-10-24 DIAGNOSIS — Z6835 Body mass index (BMI) 35.0-35.9, adult: Secondary | ICD-10-CM | POA: Diagnosis not present

## 2019-10-24 DIAGNOSIS — Z1159 Encounter for screening for other viral diseases: Secondary | ICD-10-CM | POA: Diagnosis not present

## 2019-10-24 DIAGNOSIS — N1832 Chronic kidney disease, stage 3b: Secondary | ICD-10-CM | POA: Diagnosis not present

## 2019-10-24 DIAGNOSIS — E781 Pure hyperglyceridemia: Secondary | ICD-10-CM | POA: Diagnosis not present

## 2019-10-24 DIAGNOSIS — D638 Anemia in other chronic diseases classified elsewhere: Secondary | ICD-10-CM | POA: Diagnosis not present

## 2019-10-24 DIAGNOSIS — I10 Essential (primary) hypertension: Secondary | ICD-10-CM | POA: Diagnosis not present

## 2019-10-24 DIAGNOSIS — E119 Type 2 diabetes mellitus without complications: Secondary | ICD-10-CM | POA: Diagnosis not present

## 2019-11-07 DIAGNOSIS — H2513 Age-related nuclear cataract, bilateral: Secondary | ICD-10-CM | POA: Diagnosis not present

## 2019-11-14 DIAGNOSIS — N183 Chronic kidney disease, stage 3 unspecified: Secondary | ICD-10-CM | POA: Diagnosis not present

## 2019-11-14 DIAGNOSIS — E1142 Type 2 diabetes mellitus with diabetic polyneuropathy: Secondary | ICD-10-CM | POA: Diagnosis not present

## 2019-11-14 DIAGNOSIS — E039 Hypothyroidism, unspecified: Secondary | ICD-10-CM | POA: Diagnosis not present

## 2019-11-14 DIAGNOSIS — E1122 Type 2 diabetes mellitus with diabetic chronic kidney disease: Secondary | ICD-10-CM | POA: Diagnosis not present

## 2019-11-15 DIAGNOSIS — E1140 Type 2 diabetes mellitus with diabetic neuropathy, unspecified: Secondary | ICD-10-CM | POA: Diagnosis not present

## 2019-11-15 DIAGNOSIS — Z89412 Acquired absence of left great toe: Secondary | ICD-10-CM | POA: Diagnosis not present

## 2019-11-15 DIAGNOSIS — L03032 Cellulitis of left toe: Secondary | ICD-10-CM | POA: Diagnosis not present

## 2019-11-15 DIAGNOSIS — B351 Tinea unguium: Secondary | ICD-10-CM | POA: Diagnosis not present

## 2019-11-15 DIAGNOSIS — L97521 Non-pressure chronic ulcer of other part of left foot limited to breakdown of skin: Secondary | ICD-10-CM | POA: Diagnosis not present

## 2019-11-29 DIAGNOSIS — L97521 Non-pressure chronic ulcer of other part of left foot limited to breakdown of skin: Secondary | ICD-10-CM | POA: Diagnosis not present

## 2019-11-29 DIAGNOSIS — E1140 Type 2 diabetes mellitus with diabetic neuropathy, unspecified: Secondary | ICD-10-CM | POA: Diagnosis not present

## 2019-12-19 DIAGNOSIS — H25012 Cortical age-related cataract, left eye: Secondary | ICD-10-CM | POA: Diagnosis not present

## 2019-12-19 DIAGNOSIS — I10 Essential (primary) hypertension: Secondary | ICD-10-CM | POA: Diagnosis not present

## 2020-01-03 ENCOUNTER — Encounter: Payer: Self-pay | Admitting: Ophthalmology

## 2020-01-07 ENCOUNTER — Encounter: Payer: Self-pay | Admitting: Ophthalmology

## 2020-01-07 ENCOUNTER — Other Ambulatory Visit: Payer: Self-pay

## 2020-01-08 ENCOUNTER — Other Ambulatory Visit
Admission: RE | Admit: 2020-01-08 | Discharge: 2020-01-08 | Disposition: A | Discharge status: Home / Self Care | Payer: PPO | Source: Ambulatory Visit | Attending: Ophthalmology | Admitting: Ophthalmology

## 2020-01-08 ENCOUNTER — Other Ambulatory Visit: Payer: Self-pay

## 2020-01-08 DIAGNOSIS — Z79899 Other long term (current) drug therapy: Secondary | ICD-10-CM | POA: Diagnosis not present

## 2020-01-08 DIAGNOSIS — Z79890 Hormone replacement therapy (postmenopausal): Secondary | ICD-10-CM | POA: Diagnosis not present

## 2020-01-08 DIAGNOSIS — Z7984 Long term (current) use of oral hypoglycemic drugs: Secondary | ICD-10-CM | POA: Diagnosis not present

## 2020-01-08 DIAGNOSIS — E039 Hypothyroidism, unspecified: Secondary | ICD-10-CM | POA: Diagnosis not present

## 2020-01-08 DIAGNOSIS — E1136 Type 2 diabetes mellitus with diabetic cataract: Secondary | ICD-10-CM | POA: Diagnosis not present

## 2020-01-08 DIAGNOSIS — Z85828 Personal history of other malignant neoplasm of skin: Secondary | ICD-10-CM | POA: Diagnosis not present

## 2020-01-08 DIAGNOSIS — Z89411 Acquired absence of right great toe: Secondary | ICD-10-CM | POA: Diagnosis not present

## 2020-01-08 DIAGNOSIS — F319 Bipolar disorder, unspecified: Secondary | ICD-10-CM | POA: Diagnosis not present

## 2020-01-08 DIAGNOSIS — H2512 Age-related nuclear cataract, left eye: Secondary | ICD-10-CM | POA: Diagnosis not present

## 2020-01-08 DIAGNOSIS — Z20822 Contact with and (suspected) exposure to covid-19: Secondary | ICD-10-CM | POA: Diagnosis not present

## 2020-01-08 DIAGNOSIS — I10 Essential (primary) hypertension: Secondary | ICD-10-CM | POA: Diagnosis not present

## 2020-01-08 LAB — SARS CORONAVIRUS 2 (TAT 6-24 HRS): SARS Coronavirus 2: NEGATIVE

## 2020-01-08 NOTE — Discharge Instructions (Signed)

## 2020-01-10 ENCOUNTER — Ambulatory Visit: Payer: PPO | Admitting: Anesthesiology

## 2020-01-10 ENCOUNTER — Ambulatory Visit
Admission: RE | Admit: 2020-01-10 | Discharge: 2020-01-10 | Disposition: A | Discharge status: Home / Self Care | Payer: PPO | Source: Ambulatory Visit | Attending: Ophthalmology | Admitting: Ophthalmology

## 2020-01-10 ENCOUNTER — Encounter
Admission: RE | Disposition: A | Discharge status: Home / Self Care | Payer: Self-pay | Source: Ambulatory Visit | Attending: Ophthalmology

## 2020-01-10 ENCOUNTER — Other Ambulatory Visit: Payer: Self-pay

## 2020-01-10 ENCOUNTER — Encounter: Payer: Self-pay | Admitting: Ophthalmology

## 2020-01-10 DIAGNOSIS — H25812 Combined forms of age-related cataract, left eye: Secondary | ICD-10-CM | POA: Diagnosis not present

## 2020-01-10 DIAGNOSIS — I10 Essential (primary) hypertension: Secondary | ICD-10-CM | POA: Insufficient documentation

## 2020-01-10 DIAGNOSIS — F319 Bipolar disorder, unspecified: Secondary | ICD-10-CM | POA: Insufficient documentation

## 2020-01-10 DIAGNOSIS — Z79890 Hormone replacement therapy (postmenopausal): Secondary | ICD-10-CM | POA: Insufficient documentation

## 2020-01-10 DIAGNOSIS — H2512 Age-related nuclear cataract, left eye: Secondary | ICD-10-CM | POA: Diagnosis not present

## 2020-01-10 DIAGNOSIS — Z85828 Personal history of other malignant neoplasm of skin: Secondary | ICD-10-CM | POA: Insufficient documentation

## 2020-01-10 DIAGNOSIS — Z79899 Other long term (current) drug therapy: Secondary | ICD-10-CM | POA: Insufficient documentation

## 2020-01-10 DIAGNOSIS — Z89411 Acquired absence of right great toe: Secondary | ICD-10-CM | POA: Insufficient documentation

## 2020-01-10 DIAGNOSIS — Z20822 Contact with and (suspected) exposure to covid-19: Secondary | ICD-10-CM | POA: Insufficient documentation

## 2020-01-10 DIAGNOSIS — E1136 Type 2 diabetes mellitus with diabetic cataract: Secondary | ICD-10-CM | POA: Insufficient documentation

## 2020-01-10 DIAGNOSIS — E039 Hypothyroidism, unspecified: Secondary | ICD-10-CM | POA: Insufficient documentation

## 2020-01-10 DIAGNOSIS — Z7984 Long term (current) use of oral hypoglycemic drugs: Secondary | ICD-10-CM | POA: Insufficient documentation

## 2020-01-10 DIAGNOSIS — H25012 Cortical age-related cataract, left eye: Secondary | ICD-10-CM | POA: Diagnosis not present

## 2020-01-10 HISTORY — DX: Disorder of kidney and ureter, unspecified: N28.9

## 2020-01-10 HISTORY — PX: CATARACT EXTRACTION W/PHACO: SHX586

## 2020-01-10 HISTORY — DX: Anemia, unspecified: D64.9

## 2020-01-10 HISTORY — DX: Arthropathic psoriasis, unspecified: L40.50

## 2020-01-10 HISTORY — DX: Malignant (primary) neoplasm, unspecified: C80.1

## 2020-01-10 HISTORY — DX: Dyspnea, unspecified: R06.00

## 2020-01-10 LAB — GLUCOSE, CAPILLARY
Glucose-Capillary: 68 mg/dL — ABNORMAL LOW (ref 70–99)
Glucose-Capillary: 103 mg/dL — ABNORMAL HIGH (ref 70–99)
Glucose-Capillary: 93 mg/dL (ref 70–99)

## 2020-01-10 SURGERY — PHACOEMULSIFICATION, CATARACT, WITH IOL INSERTION
Anesthesia: Monitor Anesthesia Care | Site: Eye | Laterality: Left

## 2020-01-10 MED ORDER — FENTANYL CITRATE (PF) 100 MCG/2ML IJ SOLN
INTRAMUSCULAR | Status: DC | PRN
  Administered 2020-01-10 (×2): 50 ug via INTRAVENOUS

## 2020-01-10 MED ORDER — ARMC OPHTHALMIC DILATING DROPS
1.0000 "application " | OPHTHALMIC | Status: DC | PRN
  Administered 2020-01-10 (×3): 1 via OPHTHALMIC

## 2020-01-10 MED ORDER — PROVISC 10 MG/ML IO SOLN
INTRAOCULAR | Status: DC | PRN
  Administered 2020-01-10: 0.55 mL via INTRAOCULAR

## 2020-01-10 MED ORDER — TRYPAN BLUE 0.06 % OP SOLN
OPHTHALMIC | Status: DC | PRN
  Administered 2020-01-10: 0.5 mL via INTRAOCULAR

## 2020-01-10 MED ORDER — TETRACAINE 0.5 % OP SOLN OPTIME - NO CHARGE
OPHTHALMIC | Status: DC | PRN
  Administered 2020-01-10: 2 [drp] via OPHTHALMIC

## 2020-01-10 MED ORDER — ACETAMINOPHEN 325 MG PO TABS: 325.0000 mg | ORAL_TABLET | Freq: Once | ORAL | Status: DC

## 2020-01-10 MED ORDER — MOXIFLOXACIN HCL 0.5 % OP SOLN
OPHTHALMIC | Status: DC | PRN
  Administered 2020-01-10: 0.2 mL via OPHTHALMIC

## 2020-01-10 MED ORDER — NA CHONDROIT SULF-NA HYALURON 40-17 MG/ML IO SOLN
INTRAOCULAR | Status: DC | PRN
  Administered 2020-01-10: 1 mL via INTRAOCULAR

## 2020-01-10 MED ORDER — MIDAZOLAM HCL 2 MG/2ML IJ SOLN
INTRAMUSCULAR | Status: DC | PRN
  Administered 2020-01-10 (×2): 1 mg via INTRAVENOUS

## 2020-01-10 MED ORDER — BRIMONIDINE TARTRATE-TIMOLOL 0.2-0.5 % OP SOLN
OPHTHALMIC | Status: DC | PRN
  Administered 2020-01-10: 1 [drp] via OPHTHALMIC

## 2020-01-10 MED ORDER — TETRACAINE HCL 0.5 % OP SOLN
1.0000 [drp] | OPHTHALMIC | Status: DC | PRN
Start: 2020-01-10 — End: 2020-01-10
  Administered 2020-01-10 (×3): 1 [drp] via OPHTHALMIC

## 2020-01-10 MED ORDER — LIDOCAINE HCL (PF) 2 % IJ SOLN
INTRAOCULAR | Status: DC | PRN
  Administered 2020-01-10: 2 mL via INTRAOCULAR

## 2020-01-10 MED ORDER — LACTATED RINGERS IV SOLN: 10.0000 mL/h | INTRAVENOUS | Status: DC

## 2020-01-10 MED ORDER — DEXTROSE 50 % IV SOLN
25.0000 mL | Freq: Once | INTRAVENOUS | Status: AC
  Administered 2020-01-10: 25 mL via INTRAVENOUS

## 2020-01-10 MED ORDER — EPINEPHRINE PF 1 MG/ML IJ SOLN
INTRAOCULAR | Status: DC | PRN
  Administered 2020-01-10: 83 mL via OPHTHALMIC

## 2020-01-10 MED ORDER — ACETAMINOPHEN 160 MG/5ML PO SOLN: 325.0000 mg | Freq: Once | ORAL | Status: DC

## 2020-01-10 SURGICAL SUPPLY — 17 items
DISSECTOR HYDRO NUCLEUS 50X22 (MISCELLANEOUS) ×12 IMPLANT
DRSG TEGADERM 2-3/8X2-3/4 SM (GAUZE/BANDAGES/DRESSINGS) ×3 IMPLANT
GLOVE BIOGEL PI IND STRL 8 (GLOVE) ×1 IMPLANT
GLOVE BIOGEL PI INDICATOR 8 (GLOVE) ×4
GOWN STRL REUS W/ TWL LRG LVL3 (GOWN DISPOSABLE) ×1 IMPLANT
GOWN STRL REUS W/ TWL XL LVL3 (GOWN DISPOSABLE) ×1 IMPLANT
GOWN STRL REUS W/TWL LRG LVL3 (GOWN DISPOSABLE) ×2
GOWN STRL REUS W/TWL XL LVL3 (GOWN DISPOSABLE) ×2
KNIFE 45D UP 2.3 (MISCELLANEOUS) ×3 IMPLANT
LENS IOL DIOP 19.5 (Intraocular Lens) ×3 IMPLANT
LENS IOL TECNIS MONO 19.5 (Intraocular Lens) IMPLANT
PACK CATARACT (MISCELLANEOUS) ×3 IMPLANT
PACK DR. KING ARMS (PACKS) ×3 IMPLANT
PACK EYE AFTER SURG (MISCELLANEOUS) ×3 IMPLANT
SOLUTION OPHTHALMIC SALT (MISCELLANEOUS) ×3 IMPLANT
WATER STERILE IRR 250ML POUR (IV SOLUTION) ×3 IMPLANT
WIPE NON LINTING 3.25X3.25 (MISCELLANEOUS) ×3 IMPLANT

## 2020-01-10 NOTE — Anesthesia Procedure Notes (Signed)
Procedure Name: MAC Date/Time: 01/10/2020 10:03 AM Performed by: Vanetta Shawl, CRNA Pre-anesthesia Checklist: Patient identified, Emergency Drugs available, Suction available, Timeout performed and Patient being monitored Patient Re-evaluated:Patient Re-evaluated prior to induction Oxygen Delivery Method: Nasal cannula Placement Confirmation: positive ETCO2

## 2020-01-10 NOTE — H&P (Signed)
   I have reviewed the patient's H&P and agree with its findings. There have been no interval changes.  Skyanne Welle MD Ophthalmology 

## 2020-01-10 NOTE — Op Note (Signed)
PREOPERATIVE DIAGNOSIS:  Nuclear sclerotic cataract of the LEFT eye.   POSTOPERATIVE DIAGNOSIS:  Nuclear sclerotic cataract of the LEFT eye.   OPERATIVE PROCEDURE: Cataract surgery OS   SURGEON:  Marchia Meiers, MD.   ANESTHESIA:  Anesthesiologist: Ronelle Nigh, MD CRNA: Vanetta Shawl, CRNA  1.      Managed anesthesia care. 2.     0.87ml of Shugarcaine was instilled following the paracentesis   COMPLICATIONS:  None.   TECHNIQUE:   Divide and conquer   DESCRIPTION OF PROCEDURE:  The patient was examined and consented in the preoperative holding area where the aforementioned topical anesthesia was applied to the LEFT eye and then brought back to the Operating Room where the left eye was prepped and draped in the usual sterile ophthalmic fashion and a lid speculum was placed. A paracentesis was created with the side port blade, the anterior chamber was washed out with trypan blue to stain the anterior capsule, and the anterior chamber was filled with viscoelastic. A near clear corneal incision was performed with the steel keratome. A continuous curvilinear capsulorrhexis was performed with a cystotome followed by the capsulorrhexis forceps. Hydrodissection and hydrodelineation were carried out with BSS on a blunt cannula. The lens was removed in a divide and conquer  technique and the remaining cortical material was removed with the irrigation-aspiration handpiece. The capsular bag was inflated with viscoelastic and the lens was placed in the capsular bag without complication. The remaining viscoelastic was removed from the eye with the irrigation-aspiration handpiece. The wounds were hydrated. The anterior chamber was flushed and the eye was inflated to physiologic pressure. 0.10ml Vigamox was placed in the anterior chamber. The wounds were found to be water tight. The eye was dressed with Vigamox. The patient was given protective glasses to wear throughout the day and a shield with which to  sleep tonight. The patient was also given drops with which to begin a drop regimen today and will follow-up with me in one day. Implant Name Type Inv. Item Serial No. Manufacturer Lot No. LRB No. Used Action  LENS IOL DIOP 19.5 - [id number] Intraocular Lens LENS IOL DIOP 19.5 [id number]  Left 1 Implanted    Procedure(s) with comments: CATARACT EXTRACTION PHACO AND INTRAOCULAR LENS PLACEMENT (IOC) LEFT DIABETIC (Left) - 5.32 0:41.5  Electronically signed: Marchia Meiers 01/10/2020 10:34 AM

## 2020-01-10 NOTE — Anesthesia Postprocedure Evaluation (Signed)
Anesthesia Post Note  Patient: Stacey Ball  Procedure(s) Performed: CATARACT EXTRACTION PHACO AND INTRAOCULAR LENS PLACEMENT (IOC) LEFT DIABETIC (Left Eye)     Patient location during evaluation: PACU Anesthesia Type: MAC Level of consciousness: awake and alert and oriented Pain management: satisfactory to patient Vital Signs Assessment: post-procedure vital signs reviewed and stable Respiratory status: spontaneous breathing, nonlabored ventilation and respiratory function stable Cardiovascular status: blood pressure returned to baseline and stable Postop Assessment: Adequate PO intake and No signs of nausea or vomiting Anesthetic complications: no    Raliegh Ip

## 2020-01-10 NOTE — Anesthesia Preprocedure Evaluation (Signed)
Anesthesia Evaluation  Patient identified by MRN, date of birth, ID band Patient awake    Reviewed: Allergy & Precautions, H&P , NPO status , Patient's Chart, lab work & pertinent test results  Airway Mallampati: IV  TM Distance: >3 FB Neck ROM: full    Dental no notable dental hx.    Pulmonary    Pulmonary exam normal breath sounds clear to auscultation       Cardiovascular hypertension, Normal cardiovascular exam Rhythm:regular Rate:Normal     Neuro/Psych    GI/Hepatic   Endo/Other  diabetesHypothyroidism   Renal/GU      Musculoskeletal   Abdominal   Peds  Hematology   Anesthesia Other Findings   Reproductive/Obstetrics                             Anesthesia Physical Anesthesia Plan  ASA: III  Anesthesia Plan: MAC   Post-op Pain Management:    Induction:   PONV Risk Score and Plan: 2 and Treatment may vary due to age or medical condition, TIVA and Midazolam  Airway Management Planned:   Additional Equipment:   Intra-op Plan:   Post-operative Plan:   Informed Consent: I have reviewed the patients History and Physical, chart, labs and discussed the procedure including the risks, benefits and alternatives for the proposed anesthesia with the patient or authorized representative who has indicated his/her understanding and acceptance.     Dental Advisory Given  Plan Discussed with: CRNA  Anesthesia Plan Comments:         Anesthesia Quick Evaluation

## 2020-01-10 NOTE — Transfer of Care (Signed)
Immediate Anesthesia Transfer of Care Note  Patient: Stacey Ball  Procedure(s) Performed: CATARACT EXTRACTION PHACO AND INTRAOCULAR LENS PLACEMENT (IOC) LEFT DIABETIC (Left Eye)  Patient Location: PACU  Anesthesia Type: MAC  Level of Consciousness: awake, alert  and patient cooperative  Airway and Oxygen Therapy: Patient Spontanous Breathing   Post-op Assessment: Post-op Vital signs reviewed, Patient's Cardiovascular Status Stable, Respiratory Function Stable, Patent Airway and No signs of Nausea or vomiting  Post-op Vital Signs: Reviewed and stable  Complications: No apparent anesthesia complications

## 2020-01-28 DIAGNOSIS — H2511 Age-related nuclear cataract, right eye: Secondary | ICD-10-CM | POA: Diagnosis not present

## 2020-01-28 DIAGNOSIS — E1159 Type 2 diabetes mellitus with other circulatory complications: Secondary | ICD-10-CM | POA: Diagnosis not present

## 2020-01-30 ENCOUNTER — Other Ambulatory Visit: Payer: Self-pay

## 2020-01-30 ENCOUNTER — Encounter: Payer: Self-pay | Admitting: Ophthalmology

## 2020-02-05 ENCOUNTER — Other Ambulatory Visit
Admission: RE | Admit: 2020-02-05 | Discharge: 2020-02-05 | Disposition: A | Discharge status: Home / Self Care | Payer: PPO | Source: Ambulatory Visit | Attending: Ophthalmology | Admitting: Ophthalmology

## 2020-02-05 ENCOUNTER — Other Ambulatory Visit: Payer: Self-pay

## 2020-02-05 DIAGNOSIS — Z20822 Contact with and (suspected) exposure to covid-19: Secondary | ICD-10-CM | POA: Insufficient documentation

## 2020-02-05 DIAGNOSIS — Z01812 Encounter for preprocedural laboratory examination: Secondary | ICD-10-CM | POA: Insufficient documentation

## 2020-02-05 LAB — SARS CORONAVIRUS 2 (TAT 6-24 HRS): SARS Coronavirus 2: NEGATIVE

## 2020-02-05 NOTE — Discharge Instructions (Signed)

## 2020-02-07 ENCOUNTER — Encounter
Admission: RE | Disposition: A | Discharge status: Home / Self Care | Payer: Self-pay | Source: Home / Self Care | Attending: Ophthalmology

## 2020-02-07 ENCOUNTER — Other Ambulatory Visit: Payer: Self-pay

## 2020-02-07 ENCOUNTER — Encounter: Payer: Self-pay | Admitting: Ophthalmology

## 2020-02-07 ENCOUNTER — Ambulatory Visit: Payer: PPO | Admitting: Anesthesiology

## 2020-02-07 ENCOUNTER — Ambulatory Visit
Admission: RE | Admit: 2020-02-07 | Discharge: 2020-02-07 | Disposition: A | Discharge status: Home / Self Care | Payer: PPO | Attending: Ophthalmology | Admitting: Ophthalmology

## 2020-02-07 DIAGNOSIS — M1990 Unspecified osteoarthritis, unspecified site: Secondary | ICD-10-CM | POA: Diagnosis not present

## 2020-02-07 DIAGNOSIS — Z85828 Personal history of other malignant neoplasm of skin: Secondary | ICD-10-CM | POA: Diagnosis not present

## 2020-02-07 DIAGNOSIS — I129 Hypertensive chronic kidney disease with stage 1 through stage 4 chronic kidney disease, or unspecified chronic kidney disease: Secondary | ICD-10-CM | POA: Insufficient documentation

## 2020-02-07 DIAGNOSIS — H25811 Combined forms of age-related cataract, right eye: Secondary | ICD-10-CM | POA: Diagnosis not present

## 2020-02-07 DIAGNOSIS — Z7984 Long term (current) use of oral hypoglycemic drugs: Secondary | ICD-10-CM | POA: Diagnosis not present

## 2020-02-07 DIAGNOSIS — F319 Bipolar disorder, unspecified: Secondary | ICD-10-CM | POA: Insufficient documentation

## 2020-02-07 DIAGNOSIS — N189 Chronic kidney disease, unspecified: Secondary | ICD-10-CM | POA: Diagnosis not present

## 2020-02-07 DIAGNOSIS — Z79890 Hormone replacement therapy (postmenopausal): Secondary | ICD-10-CM | POA: Insufficient documentation

## 2020-02-07 DIAGNOSIS — E1142 Type 2 diabetes mellitus with diabetic polyneuropathy: Secondary | ICD-10-CM | POA: Diagnosis not present

## 2020-02-07 DIAGNOSIS — E1122 Type 2 diabetes mellitus with diabetic chronic kidney disease: Secondary | ICD-10-CM | POA: Insufficient documentation

## 2020-02-07 DIAGNOSIS — E039 Hypothyroidism, unspecified: Secondary | ICD-10-CM | POA: Diagnosis not present

## 2020-02-07 DIAGNOSIS — Z888 Allergy status to other drugs, medicaments and biological substances status: Secondary | ICD-10-CM | POA: Diagnosis not present

## 2020-02-07 DIAGNOSIS — H2511 Age-related nuclear cataract, right eye: Secondary | ICD-10-CM | POA: Diagnosis not present

## 2020-02-07 DIAGNOSIS — Z79899 Other long term (current) drug therapy: Secondary | ICD-10-CM | POA: Diagnosis not present

## 2020-02-07 HISTORY — PX: CATARACT EXTRACTION W/PHACO: SHX586

## 2020-02-07 LAB — GLUCOSE, CAPILLARY
Glucose-Capillary: 82 mg/dL (ref 70–99)
Glucose-Capillary: 60 mg/dL — ABNORMAL LOW (ref 70–99)

## 2020-02-07 SURGERY — PHACOEMULSIFICATION, CATARACT, WITH IOL INSERTION
Anesthesia: Monitor Anesthesia Care | Site: Eye | Laterality: Right

## 2020-02-07 MED ORDER — NEOMYCIN-POLYMYXIN-DEXAMETH 3.5-10000-0.1 OP OINT
TOPICAL_OINTMENT | OPHTHALMIC | Status: DC | PRN
  Administered 2020-02-07: 1 via OPHTHALMIC

## 2020-02-07 MED ORDER — TETRACAINE HCL 0.5 % OP SOLN
1.0000 [drp] | OPHTHALMIC | Status: DC | PRN
  Administered 2020-02-07 (×3): 1 [drp] via OPHTHALMIC

## 2020-02-07 MED ORDER — TETRACAINE 0.5 % OP SOLN OPTIME - NO CHARGE
OPHTHALMIC | Status: DC | PRN
  Administered 2020-02-07: 2 [drp] via OPHTHALMIC

## 2020-02-07 MED ORDER — NA CHONDROIT SULF-NA HYALURON 40-17 MG/ML IO SOLN
INTRAOCULAR | Status: DC | PRN
  Administered 2020-02-07: 1 mL via INTRAOCULAR

## 2020-02-07 MED ORDER — MIDAZOLAM HCL 2 MG/2ML IJ SOLN
INTRAMUSCULAR | Status: DC | PRN
  Administered 2020-02-07: 2 mg via INTRAVENOUS

## 2020-02-07 MED ORDER — ACETAMINOPHEN 325 MG PO TABS: 325.0000 mg | ORAL_TABLET | ORAL | Status: DC | PRN

## 2020-02-07 MED ORDER — BRIMONIDINE TARTRATE-TIMOLOL 0.2-0.5 % OP SOLN
OPHTHALMIC | Status: DC | PRN
  Administered 2020-02-07: 1 [drp] via OPHTHALMIC

## 2020-02-07 MED ORDER — LIDOCAINE HCL (PF) 2 % IJ SOLN
INTRAOCULAR | Status: DC | PRN
  Administered 2020-02-07: 2 mL via INTRAOCULAR

## 2020-02-07 MED ORDER — EPINEPHRINE PF 1 MG/ML IJ SOLN
INTRAOCULAR | Status: DC | PRN
  Administered 2020-02-07: 08:00:00 57 mL via OPHTHALMIC

## 2020-02-07 MED ORDER — FENTANYL CITRATE (PF) 100 MCG/2ML IJ SOLN
INTRAMUSCULAR | Status: DC | PRN
  Administered 2020-02-07 (×2): 50 ug via INTRAVENOUS

## 2020-02-07 MED ORDER — TRYPAN BLUE 0.06 % OP SOLN
OPHTHALMIC | Status: DC | PRN
  Administered 2020-02-07: 0.5 mL via INTRAOCULAR

## 2020-02-07 MED ORDER — ARMC OPHTHALMIC DILATING DROPS
1.0000 "application " | OPHTHALMIC | Status: DC | PRN
  Administered 2020-02-07 (×3): 1 via OPHTHALMIC

## 2020-02-07 MED ORDER — MOXIFLOXACIN HCL 0.5 % OP SOLN
OPHTHALMIC | Status: DC | PRN
  Administered 2020-02-07: 0.2 mL via OPHTHALMIC

## 2020-02-07 MED ORDER — ONDANSETRON HCL 4 MG/2ML IJ SOLN: 4.0000 mg | Freq: Once | INTRAMUSCULAR | Status: DC | PRN

## 2020-02-07 MED ORDER — ACETAMINOPHEN 160 MG/5ML PO SOLN: 325.0000 mg | ORAL | Status: DC | PRN

## 2020-02-07 MED ORDER — PROVISC 10 MG/ML IO SOLN
INTRAOCULAR | Status: DC | PRN
  Administered 2020-02-07: 0.55 mL via INTRAOCULAR

## 2020-02-07 SURGICAL SUPPLY — 17 items
DISSECTOR HYDRO NUCLEUS 50X22 (MISCELLANEOUS) ×12 IMPLANT
DRSG TEGADERM 2-3/8X2-3/4 SM (GAUZE/BANDAGES/DRESSINGS) ×3 IMPLANT
GLOVE BIOGEL PI IND STRL 8 (GLOVE) ×1 IMPLANT
GLOVE BIOGEL PI INDICATOR 8 (GLOVE) ×2
GOWN STRL REUS W/ TWL LRG LVL3 (GOWN DISPOSABLE) ×1 IMPLANT
GOWN STRL REUS W/ TWL XL LVL3 (GOWN DISPOSABLE) ×1 IMPLANT
GOWN STRL REUS W/TWL LRG LVL3 (GOWN DISPOSABLE) ×2
GOWN STRL REUS W/TWL XL LVL3 (GOWN DISPOSABLE) ×2
KNIFE 45D UP 2.3 (MISCELLANEOUS) ×3 IMPLANT
LENS IOL DIOP 19.0 (Intraocular Lens) ×3 IMPLANT
LENS IOL TECNIS MONO 19.0 (Intraocular Lens) IMPLANT
PACK CATARACT (MISCELLANEOUS) ×3 IMPLANT
PACK DR. KING ARMS (PACKS) ×3 IMPLANT
PACK EYE AFTER SURG (MISCELLANEOUS) ×3 IMPLANT
SOLUTION OPHTHALMIC SALT (MISCELLANEOUS) ×3 IMPLANT
WATER STERILE IRR 250ML POUR (IV SOLUTION) ×3 IMPLANT
WIPE NON LINTING 3.25X3.25 (MISCELLANEOUS) ×3 IMPLANT

## 2020-02-07 NOTE — Anesthesia Procedure Notes (Signed)
Procedure Name: MAC Date/Time: 02/07/2020 7:35 AM Performed by: Jeannene Patella, CRNA Pre-anesthesia Checklist: Patient identified, Emergency Drugs available, Suction available, Patient being monitored and Timeout performed Patient Re-evaluated:Patient Re-evaluated prior to induction Oxygen Delivery Method: Nasal cannula

## 2020-02-07 NOTE — Anesthesia Postprocedure Evaluation (Signed)
Anesthesia Post Note  Patient: Stacey Ball  Procedure(s) Performed: CATARACT EXTRACTION PHACO AND INTRAOCULAR LENS PLACEMENT (IOC) RIGHT DIABETIC VISION BLUE 4.01 00:27.4 (Right Eye)     Anesthesia Post Evaluation  Eliezer Khawaja   Patient blood glucose 82 upon arrival to facility this morning. BG 60 upon PACU arrival. Patient denies symptoms of hypoglycemia. Patient given soda in PACU.

## 2020-02-07 NOTE — H&P (Signed)
   I have reviewed the patient's H&P and agree with its findings. There have been no interval changes.  Tomara Youngberg MD Ophthalmology 

## 2020-02-07 NOTE — Anesthesia Preprocedure Evaluation (Addendum)
Anesthesia Evaluation  Patient identified by MRN, date of birth, ID band Patient awake    Reviewed: Allergy & Precautions, H&P , NPO status , Patient's Chart, lab work & pertinent test results  Airway Mallampati: IV  TM Distance: >3 FB Neck ROM: full    Dental no notable dental hx.    Pulmonary neg pulmonary ROS,    Pulmonary exam normal        Cardiovascular hypertension, Normal cardiovascular exam     Neuro/Psych PSYCHIATRIC DISORDERS Bipolar Disorder negative neurological ROS     GI/Hepatic negative GI ROS, Neg liver ROS,   Endo/Other  diabetes, Type 2Hypothyroidism   Renal/GU negative Renal ROS     Musculoskeletal  (+) Arthritis ,   Abdominal (+) + obese,   Peds  Hematology negative hematology ROS (+)   Anesthesia Other Findings   Reproductive/Obstetrics                             Anesthesia Physical  Anesthesia Plan  ASA: III  Anesthesia Plan: MAC   Post-op Pain Management:    Induction: Intravenous  PONV Risk Score and Plan: 2 and Treatment may vary due to age or medical condition, TIVA and Midazolam  Airway Management Planned: Natural Airway  Additional Equipment:   Intra-op Plan:   Post-operative Plan:   Informed Consent: I have reviewed the patients History and Physical, chart, labs and discussed the procedure including the risks, benefits and alternatives for the proposed anesthesia with the patient or authorized representative who has indicated his/her understanding and acceptance.     Dental Advisory Given  Plan Discussed with: CRNA  Anesthesia Plan Comments:         Anesthesia Quick Evaluation

## 2020-02-07 NOTE — Transfer of Care (Signed)
Immediate Anesthesia Transfer of Care Note  Patient: Stacey Ball  Procedure(s) Performed: CATARACT EXTRACTION PHACO AND INTRAOCULAR LENS PLACEMENT (IOC) RIGHT DIABETIC VISION BLUE 4.01 00:27.4 (Right Eye)  Patient Location: PACU  Anesthesia Type: MAC  Level of Consciousness: awake, alert  and patient cooperative  Airway and Oxygen Therapy: Patient Spontanous Breathing and Patient connected to supplemental oxygen  Post-op Assessment: Post-op Vital signs reviewed, Patient's Cardiovascular Status Stable, Respiratory Function Stable, Patent Airway and No signs of Nausea or vomiting  Post-op Vital Signs: Reviewed and stable  Complications: No apparent anesthesia complications

## 2020-02-07 NOTE — Op Note (Signed)
  PREOPERATIVE DIAGNOSIS:  Nuclear sclerotic cataract of the RIGHT eye.   POSTOPERATIVE DIAGNOSIS:  Nuclear sclerotic cataract of the RIGHT eye.   OPERATIVE PROCEDURE: Cataract surgery OD   SURGEON:  Marchia Meiers, MD.   ANESTHESIA:  Anesthesiologist: Ardeth Sportsman, MD CRNA: Jeannene Patella, CRNA  1.      Managed anesthesia care. 2.     0.74ml of Shugarcaine was instilled following the paracentesis   COMPLICATIONS:  None.   TECHNIQUE:   Divide and conquer   DESCRIPTION OF PROCEDURE:  The patient was examined and consented in the preoperative holding area where the aforementioned topical anesthesia was applied to the RIGHT eye and then brought back to the Operating Room where the RIGHT eye was prepped and draped in the usual sterile ophthalmic fashion and a lid speculum was placed. A paracentesis was created with the side port blade, the anterior chamber was washed out with trypan blue to stain the anterior capsule, and the anterior chamber was filled with viscoelastic. A near clear corneal incision was performed with the steel keratome. A continuous curvilinear capsulorrhexis was performed with a cystotome followed by the capsulorrhexis forceps. Hydrodissection and hydrodelineation were carried out with BSS on a blunt cannula. The lens was removed in a divide and conquer  technique and the remaining cortical material was removed with the irrigation-aspiration handpiece. The capsular bag was inflated with viscoelastic and the lens was placed in the capsular bag without complication. The remaining viscoelastic was removed from the eye with the irrigation-aspiration handpiece. The wounds were hydrated. The anterior chamber was flushed and the eye was inflated to physiologic pressure. 0.79ml Vigamox was placed in the anterior chamber. The wounds were found to be water tight. The eye was dressed with Vigamox. The patient was given protective glasses to wear throughout the day and a shield with which  to sleep tonight. The patient was also given drops with which to begin a drop regimen today and will follow-up with me in one day. Implant Name Type Inv. Item Serial No. Manufacturer Lot No. LRB No. Used Action  LENS IOL DIOP 19.0 - [id number] Intraocular Lens LENS IOL DIOP 19.0 [id number]  Right 1 Explanted    Procedure(s) with comments: CATARACT EXTRACTION PHACO AND INTRAOCULAR LENS PLACEMENT (IOC) RIGHT DIABETIC VISION BLUE 4.01 00:27.4 (Right) - Diabetic - oral meds  Electronically signed: Demitria Hay 02/07/2020 8:06 AM

## 2020-02-15 DIAGNOSIS — Z89412 Acquired absence of left great toe: Secondary | ICD-10-CM | POA: Diagnosis not present

## 2020-02-15 DIAGNOSIS — B351 Tinea unguium: Secondary | ICD-10-CM | POA: Diagnosis not present

## 2020-02-15 DIAGNOSIS — E1140 Type 2 diabetes mellitus with diabetic neuropathy, unspecified: Secondary | ICD-10-CM | POA: Diagnosis not present

## 2020-02-15 DIAGNOSIS — S9031XA Contusion of right foot, initial encounter: Secondary | ICD-10-CM | POA: Diagnosis not present

## 2020-02-15 DIAGNOSIS — L851 Acquired keratosis [keratoderma] palmaris et plantaris: Secondary | ICD-10-CM | POA: Diagnosis not present

## 2020-02-15 DIAGNOSIS — M2041 Other hammer toe(s) (acquired), right foot: Secondary | ICD-10-CM | POA: Diagnosis not present

## 2020-02-15 DIAGNOSIS — S90121A Contusion of right lesser toe(s) without damage to nail, initial encounter: Secondary | ICD-10-CM | POA: Diagnosis not present

## 2020-03-12 DIAGNOSIS — F250 Schizoaffective disorder, bipolar type: Secondary | ICD-10-CM | POA: Diagnosis not present

## 2020-04-17 DIAGNOSIS — I10 Essential (primary) hypertension: Secondary | ICD-10-CM | POA: Diagnosis not present

## 2020-04-17 DIAGNOSIS — Z114 Encounter for screening for human immunodeficiency virus [HIV]: Secondary | ICD-10-CM | POA: Diagnosis not present

## 2020-04-17 DIAGNOSIS — D638 Anemia in other chronic diseases classified elsewhere: Secondary | ICD-10-CM | POA: Diagnosis not present

## 2020-04-17 DIAGNOSIS — E781 Pure hyperglyceridemia: Secondary | ICD-10-CM | POA: Diagnosis not present

## 2020-04-17 DIAGNOSIS — Z1159 Encounter for screening for other viral diseases: Secondary | ICD-10-CM | POA: Diagnosis not present

## 2020-04-28 DIAGNOSIS — E781 Pure hyperglyceridemia: Secondary | ICD-10-CM | POA: Diagnosis not present

## 2020-04-28 DIAGNOSIS — Z01419 Encounter for gynecological examination (general) (routine) without abnormal findings: Secondary | ICD-10-CM | POA: Diagnosis not present

## 2020-04-28 DIAGNOSIS — I10 Essential (primary) hypertension: Secondary | ICD-10-CM | POA: Diagnosis not present

## 2020-04-28 DIAGNOSIS — D638 Anemia in other chronic diseases classified elsewhere: Secondary | ICD-10-CM | POA: Diagnosis not present

## 2020-04-28 DIAGNOSIS — Z6835 Body mass index (BMI) 35.0-35.9, adult: Secondary | ICD-10-CM | POA: Diagnosis not present

## 2020-04-28 DIAGNOSIS — N183 Chronic kidney disease, stage 3 unspecified: Secondary | ICD-10-CM | POA: Diagnosis not present

## 2020-05-07 DIAGNOSIS — N183 Chronic kidney disease, stage 3 unspecified: Secondary | ICD-10-CM | POA: Diagnosis not present

## 2020-05-07 DIAGNOSIS — E1122 Type 2 diabetes mellitus with diabetic chronic kidney disease: Secondary | ICD-10-CM | POA: Diagnosis not present

## 2020-05-14 DIAGNOSIS — N183 Chronic kidney disease, stage 3 unspecified: Secondary | ICD-10-CM | POA: Diagnosis not present

## 2020-05-14 DIAGNOSIS — E1122 Type 2 diabetes mellitus with diabetic chronic kidney disease: Secondary | ICD-10-CM | POA: Diagnosis not present

## 2020-05-14 DIAGNOSIS — E1142 Type 2 diabetes mellitus with diabetic polyneuropathy: Secondary | ICD-10-CM | POA: Diagnosis not present

## 2020-05-14 DIAGNOSIS — E039 Hypothyroidism, unspecified: Secondary | ICD-10-CM | POA: Diagnosis not present

## 2020-07-30 DIAGNOSIS — F250 Schizoaffective disorder, bipolar type: Secondary | ICD-10-CM | POA: Diagnosis not present

## 2020-09-17 DIAGNOSIS — F250 Schizoaffective disorder, bipolar type: Secondary | ICD-10-CM | POA: Diagnosis not present

## 2020-09-17 DIAGNOSIS — Z79891 Long term (current) use of opiate analgesic: Secondary | ICD-10-CM | POA: Diagnosis not present

## 2020-10-21 ENCOUNTER — Emergency Department: Payer: PPO

## 2020-10-21 ENCOUNTER — Encounter (INDEPENDENT_AMBULATORY_CARE_PROVIDER_SITE_OTHER): Payer: Self-pay

## 2020-10-21 ENCOUNTER — Other Ambulatory Visit: Payer: Self-pay

## 2020-10-21 ENCOUNTER — Encounter: Payer: Self-pay | Admitting: Emergency Medicine

## 2020-10-21 DIAGNOSIS — R9431 Abnormal electrocardiogram [ECG] [EKG]: Secondary | ICD-10-CM | POA: Diagnosis not present

## 2020-10-21 DIAGNOSIS — R32 Unspecified urinary incontinence: Secondary | ICD-10-CM | POA: Diagnosis present

## 2020-10-21 DIAGNOSIS — Z91013 Allergy to seafood: Secondary | ICD-10-CM

## 2020-10-21 DIAGNOSIS — J9811 Atelectasis: Secondary | ICD-10-CM | POA: Diagnosis not present

## 2020-10-21 DIAGNOSIS — J1282 Pneumonia due to coronavirus disease 2019: Secondary | ICD-10-CM | POA: Diagnosis present

## 2020-10-21 DIAGNOSIS — I10 Essential (primary) hypertension: Secondary | ICD-10-CM | POA: Diagnosis not present

## 2020-10-21 DIAGNOSIS — N3289 Other specified disorders of bladder: Secondary | ICD-10-CM | POA: Diagnosis not present

## 2020-10-21 DIAGNOSIS — Z79890 Hormone replacement therapy (postmenopausal): Secondary | ICD-10-CM | POA: Diagnosis not present

## 2020-10-21 DIAGNOSIS — F319 Bipolar disorder, unspecified: Secondary | ICD-10-CM | POA: Diagnosis present

## 2020-10-21 DIAGNOSIS — I700 Atherosclerosis of aorta: Secondary | ICD-10-CM | POA: Diagnosis not present

## 2020-10-21 DIAGNOSIS — G9340 Encephalopathy, unspecified: Secondary | ICD-10-CM | POA: Diagnosis present

## 2020-10-21 DIAGNOSIS — Z888 Allergy status to other drugs, medicaments and biological substances status: Secondary | ICD-10-CM

## 2020-10-21 DIAGNOSIS — K8020 Calculus of gallbladder without cholecystitis without obstruction: Secondary | ICD-10-CM | POA: Diagnosis not present

## 2020-10-21 DIAGNOSIS — E875 Hyperkalemia: Secondary | ICD-10-CM | POA: Diagnosis present

## 2020-10-21 DIAGNOSIS — N183 Chronic kidney disease, stage 3 unspecified: Secondary | ICD-10-CM | POA: Diagnosis present

## 2020-10-21 DIAGNOSIS — Z85828 Personal history of other malignant neoplasm of skin: Secondary | ICD-10-CM

## 2020-10-21 DIAGNOSIS — Z7722 Contact with and (suspected) exposure to environmental tobacco smoke (acute) (chronic): Secondary | ICD-10-CM | POA: Diagnosis present

## 2020-10-21 DIAGNOSIS — I129 Hypertensive chronic kidney disease with stage 1 through stage 4 chronic kidney disease, or unspecified chronic kidney disease: Secondary | ICD-10-CM | POA: Diagnosis present

## 2020-10-21 DIAGNOSIS — Z7984 Long term (current) use of oral hypoglycemic drugs: Secondary | ICD-10-CM

## 2020-10-21 DIAGNOSIS — J9601 Acute respiratory failure with hypoxia: Secondary | ICD-10-CM | POA: Diagnosis present

## 2020-10-21 DIAGNOSIS — Z79899 Other long term (current) drug therapy: Secondary | ICD-10-CM | POA: Diagnosis not present

## 2020-10-21 DIAGNOSIS — U071 COVID-19: Principal | ICD-10-CM | POA: Diagnosis present

## 2020-10-21 DIAGNOSIS — R296 Repeated falls: Secondary | ICD-10-CM | POA: Diagnosis present

## 2020-10-21 DIAGNOSIS — A419 Sepsis, unspecified organism: Secondary | ICD-10-CM | POA: Diagnosis not present

## 2020-10-21 DIAGNOSIS — Z880 Allergy status to penicillin: Secondary | ICD-10-CM | POA: Diagnosis not present

## 2020-10-21 DIAGNOSIS — L4050 Arthropathic psoriasis, unspecified: Secondary | ICD-10-CM | POA: Diagnosis present

## 2020-10-21 DIAGNOSIS — E785 Hyperlipidemia, unspecified: Secondary | ICD-10-CM | POA: Diagnosis present

## 2020-10-21 DIAGNOSIS — R410 Disorientation, unspecified: Secondary | ICD-10-CM | POA: Diagnosis present

## 2020-10-21 DIAGNOSIS — E039 Hypothyroidism, unspecified: Secondary | ICD-10-CM | POA: Diagnosis present

## 2020-10-21 DIAGNOSIS — E1122 Type 2 diabetes mellitus with diabetic chronic kidney disease: Secondary | ICD-10-CM | POA: Diagnosis present

## 2020-10-21 DIAGNOSIS — K828 Other specified diseases of gallbladder: Secondary | ICD-10-CM | POA: Diagnosis not present

## 2020-10-21 DIAGNOSIS — R531 Weakness: Secondary | ICD-10-CM | POA: Diagnosis not present

## 2020-10-21 DIAGNOSIS — R21 Rash and other nonspecific skin eruption: Secondary | ICD-10-CM | POA: Diagnosis not present

## 2020-10-21 DIAGNOSIS — S0990XA Unspecified injury of head, initial encounter: Secondary | ICD-10-CM | POA: Diagnosis not present

## 2020-10-21 DIAGNOSIS — R509 Fever, unspecified: Secondary | ICD-10-CM | POA: Diagnosis not present

## 2020-10-21 DIAGNOSIS — R404 Transient alteration of awareness: Secondary | ICD-10-CM | POA: Diagnosis not present

## 2020-10-21 LAB — COMPREHENSIVE METABOLIC PANEL
ALT: 18 U/L (ref 0–44)
AST: 52 U/L — ABNORMAL HIGH (ref 15–41)
Albumin: 4 g/dL (ref 3.5–5.0)
Alkaline Phosphatase: 56 U/L (ref 38–126)
Anion gap: 11 (ref 5–15)
BUN: 20 mg/dL (ref 6–20)
CO2: 26 mmol/L (ref 22–32)
Calcium: 9.5 mg/dL (ref 8.9–10.3)
Chloride: 93 mmol/L — ABNORMAL LOW (ref 98–111)
Creatinine, Ser: 1.5 mg/dL — ABNORMAL HIGH (ref 0.44–1.00)
GFR, Estimated: 40 mL/min — ABNORMAL LOW (ref >60)
Glucose, Bld: 139 mg/dL — ABNORMAL HIGH (ref 70–99)
Potassium: 5.2 mmol/L — ABNORMAL HIGH (ref 3.5–5.1)
Sodium: 130 mmol/L — ABNORMAL LOW (ref 135–145)
Total Bilirubin: 0.8 mg/dL (ref 0.3–1.2)
Total Protein: 8.4 g/dL — ABNORMAL HIGH (ref 6.5–8.1)

## 2020-10-21 LAB — CBC WITH DIFFERENTIAL/PLATELET
Abs Immature Granulocytes: 0.19 10*3/uL — ABNORMAL HIGH (ref 0.00–0.07)
Basophils Absolute: 0 10*3/uL (ref 0.0–0.1)
Basophils Relative: 0 %
Eosinophils Absolute: 0 10*3/uL (ref 0.0–0.5)
Eosinophils Relative: 0 %
HCT: 38.6 % (ref 36.0–46.0)
Hemoglobin: 13.1 g/dL (ref 12.0–15.0)
Immature Granulocytes: 4 %
Lymphocytes Relative: 13 %
Lymphs Abs: 0.7 10*3/uL (ref 0.7–4.0)
MCH: 31.2 pg (ref 26.0–34.0)
MCHC: 33.9 g/dL (ref 30.0–36.0)
MCV: 91.9 fL (ref 80.0–100.0)
Monocytes Absolute: 0.5 10*3/uL (ref 0.1–1.0)
Monocytes Relative: 9 %
Neutro Abs: 4 10*3/uL (ref 1.7–7.7)
Neutrophils Relative %: 74 %
Platelets: 149 10*3/uL — ABNORMAL LOW (ref 150–400)
RBC: 4.2 MIL/uL (ref 3.87–5.11)
RDW: 13 % (ref 11.5–15.5)
WBC: 5.4 10*3/uL (ref 4.0–10.5)
nRBC: 0 % (ref 0.0–0.2)

## 2020-10-21 LAB — PROTIME-INR
INR: 0.9 (ref 0.8–1.2)
Prothrombin Time: 12 seconds (ref 11.4–15.2)

## 2020-10-21 LAB — CBG MONITORING, ED: Glucose-Capillary: 118 mg/dL — ABNORMAL HIGH (ref 70–99)

## 2020-10-21 LAB — LACTIC ACID, PLASMA: Lactic Acid, Venous: 1.3 mmol/L (ref 0.5–1.9)

## 2020-10-21 LAB — APTT: aPTT: 32 seconds (ref 24–36)

## 2020-10-21 NOTE — ED Triage Notes (Addendum)
Pt to ED by EMS from home c/o tremors for months, lying on the floor for hours, foul smelling urine per EMS.  Patient is altered in triage, keeps stating husbands name when asked hers, wrong month stated for her birthday, and not answering questions appropriately.  Pt states fell today by tripping on her dog.  Spoke to husband on phone who states she's acting "half drugged out".  He states disoriented for several days and worsening, states she fell off of the bed the other day, yesterday symptoms worsened and multiple falls, states was unable to get her off the floor tonight.  Pt has bruise to chest, redness to back.

## 2020-10-21 NOTE — ED Triage Notes (Addendum)
EMS brings pt in from home; c/o "shaking x 3 months, worse last day, has been lying on floor today since 2pm and strong odor of urine"

## 2020-10-22 ENCOUNTER — Emergency Department: Payer: PPO

## 2020-10-22 ENCOUNTER — Inpatient Hospital Stay
Admission: EM | Admit: 2020-10-22 | Discharge: 2020-10-24 | DRG: 177 | Disposition: A | Discharge status: Home / Self Care | Payer: PPO | Attending: Internal Medicine | Admitting: Internal Medicine

## 2020-10-22 DIAGNOSIS — E785 Hyperlipidemia, unspecified: Secondary | ICD-10-CM | POA: Diagnosis present

## 2020-10-22 DIAGNOSIS — R410 Disorientation, unspecified: Secondary | ICD-10-CM | POA: Diagnosis present

## 2020-10-22 DIAGNOSIS — U071 COVID-19: Secondary | ICD-10-CM

## 2020-10-22 DIAGNOSIS — Z79899 Other long term (current) drug therapy: Secondary | ICD-10-CM | POA: Diagnosis not present

## 2020-10-22 DIAGNOSIS — Z888 Allergy status to other drugs, medicaments and biological substances status: Secondary | ICD-10-CM | POA: Diagnosis not present

## 2020-10-22 DIAGNOSIS — F319 Bipolar disorder, unspecified: Secondary | ICD-10-CM

## 2020-10-22 DIAGNOSIS — J1282 Pneumonia due to coronavirus disease 2019: Secondary | ICD-10-CM | POA: Diagnosis not present

## 2020-10-22 DIAGNOSIS — R296 Repeated falls: Secondary | ICD-10-CM | POA: Diagnosis present

## 2020-10-22 DIAGNOSIS — Z7984 Long term (current) use of oral hypoglycemic drugs: Secondary | ICD-10-CM | POA: Diagnosis not present

## 2020-10-22 DIAGNOSIS — Z91013 Allergy to seafood: Secondary | ICD-10-CM | POA: Diagnosis not present

## 2020-10-22 DIAGNOSIS — Z7722 Contact with and (suspected) exposure to environmental tobacco smoke (acute) (chronic): Secondary | ICD-10-CM | POA: Diagnosis present

## 2020-10-22 DIAGNOSIS — N183 Chronic kidney disease, stage 3 unspecified: Secondary | ICD-10-CM | POA: Diagnosis present

## 2020-10-22 DIAGNOSIS — E875 Hyperkalemia: Secondary | ICD-10-CM | POA: Diagnosis present

## 2020-10-22 DIAGNOSIS — J9601 Acute respiratory failure with hypoxia: Secondary | ICD-10-CM | POA: Diagnosis present

## 2020-10-22 DIAGNOSIS — I129 Hypertensive chronic kidney disease with stage 1 through stage 4 chronic kidney disease, or unspecified chronic kidney disease: Secondary | ICD-10-CM | POA: Diagnosis present

## 2020-10-22 DIAGNOSIS — Z880 Allergy status to penicillin: Secondary | ICD-10-CM | POA: Diagnosis not present

## 2020-10-22 DIAGNOSIS — E039 Hypothyroidism, unspecified: Secondary | ICD-10-CM | POA: Diagnosis present

## 2020-10-22 DIAGNOSIS — R32 Unspecified urinary incontinence: Secondary | ICD-10-CM | POA: Diagnosis present

## 2020-10-22 DIAGNOSIS — Z85828 Personal history of other malignant neoplasm of skin: Secondary | ICD-10-CM | POA: Diagnosis not present

## 2020-10-22 DIAGNOSIS — Z79890 Hormone replacement therapy (postmenopausal): Secondary | ICD-10-CM | POA: Diagnosis not present

## 2020-10-22 DIAGNOSIS — G9340 Encephalopathy, unspecified: Secondary | ICD-10-CM | POA: Diagnosis present

## 2020-10-22 DIAGNOSIS — E1122 Type 2 diabetes mellitus with diabetic chronic kidney disease: Secondary | ICD-10-CM | POA: Diagnosis present

## 2020-10-22 DIAGNOSIS — L4050 Arthropathic psoriasis, unspecified: Secondary | ICD-10-CM | POA: Diagnosis present

## 2020-10-22 LAB — BASIC METABOLIC PANEL
Anion gap: 9 (ref 5–15)
BUN: 22 mg/dL — ABNORMAL HIGH (ref 6–20)
CO2: 25 mmol/L (ref 22–32)
Calcium: 8.6 mg/dL — ABNORMAL LOW (ref 8.9–10.3)
Chloride: 96 mmol/L — ABNORMAL LOW (ref 98–111)
Creatinine, Ser: 1.39 mg/dL — ABNORMAL HIGH (ref 0.44–1.00)
GFR, Estimated: 44 mL/min — ABNORMAL LOW (ref >60)
Glucose, Bld: 215 mg/dL — ABNORMAL HIGH (ref 70–99)
Potassium: 4.9 mmol/L (ref 3.5–5.1)
Sodium: 130 mmol/L — ABNORMAL LOW (ref 135–145)

## 2020-10-22 LAB — URINALYSIS, COMPLETE (UACMP) WITH MICROSCOPIC
Bacteria, UA: NONE SEEN
Bilirubin Urine: NEGATIVE
Glucose, UA: NEGATIVE mg/dL
Hgb urine dipstick: NEGATIVE
Ketones, ur: NEGATIVE mg/dL
Leukocytes,Ua: NEGATIVE
Nitrite: NEGATIVE
Protein, ur: NEGATIVE mg/dL
Specific Gravity, Urine: 1.015 (ref 1.005–1.030)
pH: 6 (ref 5.0–8.0)

## 2020-10-22 LAB — BLOOD GAS, VENOUS
Acid-Base Excess: 5.3 mmol/L — ABNORMAL HIGH (ref 0.0–2.0)
Bicarbonate: 31.1 mmol/L — ABNORMAL HIGH (ref 20.0–28.0)
O2 Saturation: 22.2 %
Patient temperature: 37
pCO2, Ven: 49 mmHg (ref 44.0–60.0)
pH, Ven: 7.41 (ref 7.250–7.430)
pO2, Ven: <31 mmHg — CL (ref 32.0–45.0)

## 2020-10-22 LAB — CK
Total CK: 754 U/L — ABNORMAL HIGH (ref 38–234)
Total CK: 415 U/L — ABNORMAL HIGH (ref 38–234)

## 2020-10-22 LAB — URINE DRUG SCREEN, QUALITATIVE (ARMC ONLY)
Amphetamines, Ur Screen: NOT DETECTED
Barbiturates, Ur Screen: NOT DETECTED
Benzodiazepine, Ur Scrn: NOT DETECTED
Cannabinoid 50 Ng, Ur ~~LOC~~: NOT DETECTED
Cocaine Metabolite,Ur ~~LOC~~: NOT DETECTED
MDMA (Ecstasy)Ur Screen: NOT DETECTED
Methadone Scn, Ur: NOT DETECTED
Opiate, Ur Screen: NOT DETECTED
Phencyclidine (PCP) Ur S: NOT DETECTED
Tricyclic, Ur Screen: POSITIVE — AB

## 2020-10-22 LAB — CBG MONITORING, ED
Glucose-Capillary: 126 mg/dL — ABNORMAL HIGH (ref 70–99)
Glucose-Capillary: 200 mg/dL — ABNORMAL HIGH (ref 70–99)

## 2020-10-22 LAB — VALPROIC ACID LEVEL
Valproic Acid Lvl: 95 ug/mL (ref 50.0–100.0)
Valproic Acid Lvl: 73 ug/mL (ref 50.0–100.0)

## 2020-10-22 LAB — HEMOGLOBIN A1C
Hgb A1c MFr Bld: 6.5 % — ABNORMAL HIGH (ref 4.8–5.6)
Mean Plasma Glucose: 139.85 mg/dL

## 2020-10-22 LAB — POC SARS CORONAVIRUS 2 AG -  ED: SARS Coronavirus 2 Ag: POSITIVE — AB

## 2020-10-22 LAB — AMMONIA: Ammonia: 10 umol/L (ref 9–35)

## 2020-10-22 LAB — ETHANOL: Alcohol, Ethyl (B): <10 mg/dL (ref <10)

## 2020-10-22 LAB — LACTIC ACID, PLASMA: Lactic Acid, Venous: 1.3 mmol/L (ref 0.5–1.9)

## 2020-10-22 MED ORDER — SODIUM CHLORIDE 0.9 % IV SOLN
500.0000 mg | INTRAVENOUS | Status: DC
  Administered 2020-10-22: 500 mg via INTRAVENOUS
  Filled 2020-10-22: qty 500

## 2020-10-22 MED ORDER — SODIUM CHLORIDE 0.9 % IV BOLUS
1000.0000 mL | Freq: Once | INTRAVENOUS | Status: AC
  Administered 2020-10-22: 1000 mL via INTRAVENOUS

## 2020-10-22 MED ORDER — METHYLPREDNISOLONE SODIUM SUCC 125 MG IJ SOLR
125.0000 mg | Freq: Once | INTRAMUSCULAR | Status: AC
  Administered 2020-10-22: 125 mg via INTRAVENOUS
  Filled 2020-10-22: qty 2

## 2020-10-22 MED ORDER — ACETAMINOPHEN 500 MG PO TABS
1000.0000 mg | ORAL_TABLET | Freq: Once | ORAL | Status: AC
  Administered 2020-10-22: 1000 mg via ORAL
  Filled 2020-10-22: qty 2

## 2020-10-22 MED ORDER — ACETAMINOPHEN 325 MG PO TABS
650.0000 mg | ORAL_TABLET | Freq: Four times a day (QID) | ORAL | Status: DC | PRN
  Administered 2020-10-22: 650 mg via ORAL
  Filled 2020-10-22: qty 2

## 2020-10-22 MED ORDER — DEXAMETHASONE 6 MG PO TABS
6.0000 mg | ORAL_TABLET | Freq: Every day | ORAL | Status: DC
  Administered 2020-10-23 – 2020-10-24 (×2): 6 mg via ORAL
  Filled 2020-10-22 (×3): qty 1

## 2020-10-22 MED ORDER — LACTATED RINGERS IV SOLN: INTRAVENOUS | Status: AC

## 2020-10-22 MED ORDER — SODIUM CHLORIDE 0.9 % IV SOLN
100.0000 mg | Freq: Every day | INTRAVENOUS | Status: DC
  Administered 2020-10-23 – 2020-10-24 (×2): 100 mg via INTRAVENOUS
  Filled 2020-10-22: qty 20
  Filled 2020-10-22: qty 100
  Filled 2020-10-22: qty 20

## 2020-10-22 MED ORDER — ENOXAPARIN SODIUM 40 MG/0.4ML ~~LOC~~ SOLN
40.0000 mg | SUBCUTANEOUS | Status: DC
  Administered 2020-10-22 – 2020-10-23 (×2): 40 mg via SUBCUTANEOUS
  Filled 2020-10-22 (×2): qty 0.4

## 2020-10-22 MED ORDER — DIVALPROEX SODIUM 500 MG PO DR TAB: 500.0000 mg | DELAYED_RELEASE_TABLET | Freq: Two times a day (BID) | ORAL | Status: DC

## 2020-10-22 MED ORDER — QUETIAPINE FUMARATE 300 MG PO TABS: 300.0000 mg | ORAL_TABLET | Freq: Every day | ORAL | Status: DC

## 2020-10-22 MED ORDER — INSULIN ASPART 100 UNIT/ML ~~LOC~~ SOLN
0.0000 [IU] | Freq: Three times a day (TID) | SUBCUTANEOUS | Status: DC
  Administered 2020-10-22 – 2020-10-24 (×3): 3 [IU] via SUBCUTANEOUS
  Administered 2020-10-24: 5 [IU] via SUBCUTANEOUS
  Filled 2020-10-22 (×4): qty 1

## 2020-10-22 MED ORDER — SODIUM CHLORIDE 0.9 % IV SOLN
200.0000 mg | Freq: Once | INTRAVENOUS | Status: AC
  Administered 2020-10-22: 200 mg via INTRAVENOUS
  Filled 2020-10-22: qty 200

## 2020-10-22 MED ORDER — QUETIAPINE FUMARATE 25 MG PO TABS
150.0000 mg | ORAL_TABLET | Freq: Every day | ORAL | Status: DC
  Administered 2020-10-22 – 2020-10-23 (×2): 150 mg via ORAL
  Filled 2020-10-22: qty 1
  Filled 2020-10-22: qty 6

## 2020-10-22 MED ORDER — DIVALPROEX SODIUM 250 MG PO DR TAB
250.0000 mg | DELAYED_RELEASE_TABLET | Freq: Two times a day (BID) | ORAL | Status: DC
Start: 2020-10-22 — End: 2020-10-24
  Administered 2020-10-22 – 2020-10-24 (×5): 250 mg via ORAL
  Filled 2020-10-22 (×6): qty 1

## 2020-10-22 MED ORDER — SODIUM CHLORIDE 0.9 % IV SOLN
2.0000 g | INTRAVENOUS | Status: DC
  Administered 2020-10-22: 2 g via INTRAVENOUS
  Filled 2020-10-22: qty 20

## 2020-10-22 MED ORDER — LEVOTHYROXINE SODIUM 50 MCG PO TABS
25.0000 ug | ORAL_TABLET | Freq: Every day | ORAL | Status: DC
  Administered 2020-10-23 – 2020-10-24 (×2): 25 ug via ORAL
  Filled 2020-10-22 (×2): qty 1

## 2020-10-22 NOTE — Progress Notes (Signed)
CODE SEPSIS - PHARMACY COMMUNICATION  **Broad Spectrum Antibiotics should be administered within 1 hour of Sepsis diagnosis**  Time Code Sepsis Called/Page Received: 6147  Antibiotics Ordered: 0930 (azith / CTX)  Time of 1st antibiotic administration: 1027  Additional action taken by pharmacy: Messaged RN to ensure medications were not stocked out and triage med delivery. --[phone] No issues identified. Received 1st doses.  If necessary, Name of Provider/Nurse Contacted:  Nicholes Mango, RN  Martyn Malay ,PharmD Clinical Pharmacist  10/22/2020  9:42 AM

## 2020-10-22 NOTE — Sepsis Progress Note (Signed)
Sepsis protocol being followed by [facility] 

## 2020-10-22 NOTE — H&P (Signed)
History and Physical    Stacey Ball [id number] DOB: Feb 08, 1961 DOA: 10/22/2020  PCP: Dion Body, MD  Patient coming from: home   Chief Complaint: dyspnea, confusion  HPI: Stacey Ball is a 60 y.o. female with medical history significant for bipolar disorder, htn, dm, ckd 3, hypothyroid, who presents with the above.  Unvaccinated. Has had a cough for several months that is stable. Husband does think maybe a little more "winded" the past few days. Most notable is increased confusion for a few days. No fevers, no n/v. Patient has bipolar disorder and husband says she seems very depressed for the last many months, spends most of her days in her bed, doesn't interact much. She currently denies dyspnea, denies chest pain or abdominal pain.  ED Course:   Initial concern for bacterial pneumonia so ceftriaxone and azithromycin ordered. covid resulted positive. Hypoxic to 89% on room air, improved to normal on 2 L. Labs and chest x ray ordered. Patient complained of abd pain to EDP and so ct of abd/pelvis obtained, no acute findings.   Review of Systems: As per HPI otherwise 10 point review of systems negative.    Past Medical History:  Diagnosis Date  . Anemia    HX of  . Arthritis   . Bipolar 1 disorder ([place])   . Cancer ([place])    skin cancer  . Cataracts, bilateral   . Diabetes mellitus without complication ([place])   . Dyspnea    on exertion (Pt states, "out of shape")  . Function kidney decreased   . High cholesterol   . Hypertension   . Hypothyroidism   . Psoriasis   . Psoriatic arthritis ([place])   . Thyroid disease     Past Surgical History:  Procedure Laterality Date  . CATARACT EXTRACTION W/PHACO Left 01/10/2020   Procedure: CATARACT EXTRACTION PHACO AND INTRAOCULAR LENS PLACEMENT ([place]) LEFT DIABETIC;  Surgeon: Marchia Meiers, MD;  Location: [facility];  Service: Ophthalmology;  Laterality: Left;  5.32 0:41.5  . CATARACT EXTRACTION W/PHACO Right  02/07/2020   Procedure: CATARACT EXTRACTION PHACO AND INTRAOCULAR LENS PLACEMENT ([place]) RIGHT DIABETIC VISION BLUE 4.01 00:27.4;  Surgeon: Marchia Meiers, MD;  Location: [facility];  Service: Ophthalmology;  Laterality: Right;  Diabetic - oral meds  . COLONOSCOPY WITH PROPOFOL N/A 03/04/2016   Procedure: COLONOSCOPY WITH PROPOFOL;  Surgeon: Manya Silvas, MD;  Location: [facility] ENDOSCOPY;  Service: Endoscopy;  Laterality: N/A;  . great toe amputation    . matrixectomy    . TONSILLECTOMY       reports that she is a non-smoker but has been exposed to tobacco smoke. She has never used smokeless tobacco. She reports that she does not drink alcohol and does not use drugs.  Allergies  Allergen Reactions  . Iodine Solution [Povidone Iodine] Other (See Comments)    States it was used on toes and face broke out  . Penicillins Rash  . Shellfish Allergy Rash    History reviewed. No pertinent family history.  Prior to Admission medications   Medication Sig Start Date End Date Taking? Authorizing Provider  Cholecalciferol (VITAMIN D3) 125 MCG (5000 UT) CHEW Chew by mouth. dinner    [provider]  clonazePAM (KLONOPIN) 2 MG tablet Take 1 mg by mouth 2 (two) times daily as needed.     [provider]  Cranberry 500 MG CAPS Take by mouth.    [provider]  diclofenac sodium (VOLTAREN) 1 % GEL Apply topically 4 (  four) times daily.     [provider]  divalproex (DEPAKOTE) 500 MG DR tablet Take 500 mg by mouth 2 (two) times daily. breakfast and bedtime    [provider]  Ferrous Sulfate (IRON PO) Take by mouth daily.    [provider]  fluocinonide ointment (LIDEX) [id number] % Apply 1 application topically 2 (two) times daily.    [provider]  glipiZIDE (GLUCOTROL XL) 5 MG 24 hr tablet Take 5 mg by mouth daily with breakfast.    [provider]  Javier Docker Oil 350 MG CAPS Take by mouth. dinner    [provider]   levothyroxine (SYNTHROID, LEVOTHROID) 25 MCG tablet Take 25 mcg by mouth daily before breakfast.    [provider]  losartan (COZAAR) 25 MG tablet Take 50 mg by mouth daily. bedtime    [provider]  Melatonin 10 MG SUBL Place under the tongue at bedtime.    [provider]  pioglitazone (ACTOS) 15 MG tablet Take 15 mg by mouth daily.    [provider]  Probiotic Product (PROBIOTIC ADVANCED PO) Take by mouth.    [provider]  QUEtiapine (SEROQUEL) 300 MG tablet Take 300 mg by mouth at bedtime.    [provider]    Physical Exam: Vitals:   10/22/20 1104 10/22/20 1115 10/22/20 1119 10/22/20 1145  BP:   133/69   Pulse:  95 94 92  Resp:  (!) 37 (!) 32 (!) 26  Temp: 99.6 F (37.6 C)     TempSrc: Oral     SpO2:  100% 100% 100%  Height:        Constitutional: No acute distress Head: Atraumatic Eyes: Conjunctiva clear ENM: Moist mucous membranes. Normal dentition.  Neck: Supple Respiratory: rales at bases Cardiovascular: Regular rate and rhythm. No murmurs/rubs/gallops. Abdomen: Non-tender, non-distended. No masses. No rebound or guarding. Positive bowel sounds. Musculoskeletal: No joint deformity upper and lower extremities. Normal ROM, no contractures. Normal muscle tone.  Skin: No rashes, lesions, or ulcers.  Extremities: No peripheral edema. Palpable peripheral pulses. Neurologic: confused, monosyllable answers, awake, moving all 4 extremities, no neck stiffness Psychiatric: appears confused, doesn't answer many questions   Labs on Admission: I have personally reviewed following labs and imaging studies  CBC: Recent Labs  Lab 10/21/20 2137  WBC 5.4  NEUTROABS 4.0  HGB 13.1  HCT 38.6  MCV 91.9  PLT 123456*   Basic Metabolic Panel: Recent Labs  Lab 10/21/20 2137  NA 130*  K 5.2*  CL 93*  CO2 26  GLUCOSE 139*  BUN 20  CREATININE 1.50*  CALCIUM 9.5   GFR: CrCl cannot be calculated (Unknown ideal  weight.). Liver Function Tests: Recent Labs  Lab 10/21/20 2137  AST 52*  ALT 18  ALKPHOS 56  BILITOT 0.8  PROT 8.4*  ALBUMIN 4.0   No results for input(s): LIPASE, AMYLASE in the last 168 hours. Recent Labs  Lab 10/22/20 1011  AMMONIA 10   Coagulation Profile: Recent Labs  Lab 10/21/20 2137  INR 0.9   Cardiac Enzymes: Recent Labs  Lab 10/21/20 2137  CKTOTAL 754*   BNP (last 3 results) No results for input(s): PROBNP in the last 8760 hours. HbA1C: No results for input(s): HGBA1C in the last 72 hours. CBG: Recent Labs  Lab 10/21/20 2119 10/22/20 0832  GLUCAP 118* 126*   Lipid Profile: No results for input(s): CHOL, HDL, LDLCALC, TRIG, CHOLHDL, LDLDIRECT in the last 72 hours. Thyroid Function Tests: No  results for input(s): TSH, T4TOTAL, FREET4, T3FREE, THYROIDAB in the last 72 hours. Anemia Panel: No results for input(s): VITAMINB12, FOLATE, FERRITIN, TIBC, IRON, RETICCTPCT in the last 72 hours. Urine analysis:    Component Value Date/Time   COLORURINE YELLOW (A) 10/22/2020 0924   APPEARANCEUR CLEAR (A) 10/22/2020 0924   LABSPEC 1.015 10/22/2020 0924   PHURINE 6.0 10/22/2020 0924   GLUCOSEU NEGATIVE 10/22/2020 0924   HGBUR NEGATIVE 10/22/2020 0924   BILIRUBINUR NEGATIVE 10/22/2020 0924   KETONESUR NEGATIVE 10/22/2020 0924   PROTEINUR NEGATIVE 10/22/2020 0924   NITRITE NEGATIVE 10/22/2020 0924   LEUKOCYTESUR NEGATIVE 10/22/2020 0924    Radiological Exams on Admission: CT ABDOMEN PELVIS WO CONTRAST  Result Date: 10/22/2020 CLINICAL DATA:  Abdominal pain and fevers EXAM: CT ABDOMEN AND PELVIS WITHOUT CONTRAST TECHNIQUE: Multidetector CT imaging of the abdomen and pelvis was performed following the standard protocol without IV contrast. COMPARISON:  None. FINDINGS: Lower chest: Mild bibasilar atelectasis is noted. Additionally some focal ground-glass densities are seen which are incompletely evaluated on this examination. Correlate with pending COVID-19  testing. Hepatobiliary: Liver is within normal limits. Gallbladder is well distended with multiple gallstones. No inflammatory changes of the gallbladder are seen. Pancreas: Unremarkable. No pancreatic ductal dilatation or surrounding inflammatory changes. Spleen: Normal in size without focal abnormality. Adrenals/Urinary Tract: Adrenal glands are within normal limits. No renal calculi or obstructive changes are seen. The ureters are within normal limits bilaterally. The bladder is well distended. Stomach/Bowel: The appendix is within normal limits. No obstructive or inflammatory changes of the large or small bowel are seen. Duodenal diverticulum is noted adjacent to the head of the pancreas. Stomach is unremarkable. Vascular/Lymphatic: Aortic atherosclerosis. No enlarged abdominal or pelvic lymph nodes. Reproductive: Uterus and bilateral adnexa are unremarkable. Other: No abdominal wall hernia or abnormality. No abdominopelvic ascites. Musculoskeletal: No acute or significant osseous findings. IMPRESSION: Cholelithiasis without complicating factors. Bibasilar atelectasis. Ground-glass opacities are also noted in the lower lobes. Correlate with pending COVID-19 testing. Electronically Signed   By: Inez Catalina M.D.   On: 10/22/2020 11:02   DG Chest 2 View  Result Date: 10/21/2020 CLINICAL DATA:  60 year old female with concern for sepsis. EXAM: CHEST - 2 VIEW COMPARISON:  None FINDINGS: No focal consolidation, pleural effusion, or pneumothorax. The cardiac silhouette is within limits. Atherosclerotic calcification of the aortic arch. No acute osseous pathology. Osteopenia with degenerative changes of the spine. IMPRESSION: No active cardiopulmonary disease. Electronically Signed   By: Anner Crete M.D.   On: 10/21/2020 22:07   CT HEAD WO CONTRAST  Result Date: 10/21/2020 CLINICAL DATA:  60 year old female with head trauma. EXAM: CT HEAD WITHOUT CONTRAST TECHNIQUE: Contiguous axial images were obtained from  the base of the skull through the vertex without intravenous contrast. COMPARISON:  None. FINDINGS: Evaluation of this exam is limited due to motion artifact. Brain: The ventricles and sulci appropriate size for patient's age. The gray-white matter discrimination is preserved. There is no acute intracranial hemorrhage. No mass effect or midline shift. No extra-axial fluid collection. Vascular: No hyperdense vessel or unexpected calcification. Skull: Normal. Negative for fracture or focal lesion. Sinuses/Orbits: No acute finding. Other: None IMPRESSION: No acute intracranial pathology. Electronically Signed   By: Anner Crete M.D.   On: 10/21/2020 22:22   DG Chest Portable 1 View  Result Date: 10/22/2020 CLINICAL DATA:  Weakness, mental status, tremors for months, foul smelling urine per EMS, hypertension, diabetes mellitus EXAM: PORTABLE CHEST 1 VIEW COMPARISON:  Portable exam 0910 hours compared to  10/21/2020 FINDINGS: Upper normal size of cardiac silhouette. Mediastinal contours and pulmonary vascularity normal. Bibasilar atelectasis. Upper lungs clear. No definite infiltrate, pleural effusion or pneumothorax. Osseous structures unremarkable. IMPRESSION: Bibasilar atelectasis. Electronically Signed   By: Ulyses Southward M.D.   On: 10/22/2020 09:22    EKG: Independently reviewed. Sinus tach  Assessment/Plan Active Problems:   Pneumonia due to COVID-19 virus   Bipolar disorder (HCC)   Acute encephalopathy   # Covid pneumonia # Acute hypoxic respiratory failure Unvaccinated, symptoms appear to have started 2-3 days ago (so on 1/2 or 1/3). Hypoxic here to 89, febrile to 102. CXR with bibasilar opacities. Patient is very depressed and low level of function at baseline (rarely gets out of bed) but acute illness appears to have worsened this. - f/u inflammatory markers - cont remdesivir, decadron, and  O2 - pulmonary toilet - holding on abx  # Elevated CK Likely 2/2 immobility. CK 754. S/p 1 L  and now on LR @ 150 - repeat CK this PM - continue fluids @ 150  # Hyperkalemia Mild, 5.2 - will repeat bmp  # Acute encephalopathy Likely 2/2 acute infection and hypoxia, with baseline severe depression contributory - hold home klonipin - monitor  # Bipolar disorder Severe, appears lately not coping well, spends most of every day in bed - cont home seroquel, depakote but will reduce home dose by half given somnolence - hold home klonipen  # T2DM Glucose mildly elevated - hold home actos, glipizide - start SSI  # HTN Here bp wnl - hold home losartan  # hypothyroid - cont home synthroid   DVT prophylaxis: lovenox Code Status: full  Family Communication: husband updated telephonically 1/5  Consults called: none    Status is: Inpatient  Remains inpatient appropriate because:Inpatient level of care appropriate due to severity of illness   Dispo: The patient is from: Home              Anticipated d/c is to: Home              Anticipated d/c date is: 2-4 days              Patient currently is not medically stable to d/c.        Silvano Bilis MD [facility] Pager [phone]  If 7PM-7AM, please contact night-coverage [email or web address] Password [facility]  10/22/2020, 12:59 PM

## 2020-10-22 NOTE — ED Notes (Signed)
Patient called out and sitting on front of stretcher asking for help.  Patient had climbed out of bed and urinated in floor and trash can.  Patient was assisted back up in the bed and placed on cardiac monitor.  Patient cleaned up and pure wick was applied.  Informed patient that it was important to not climb out of bed and instructed her on how the pure wick worked.  Call light was given and patient instructed to call me if she needed anything.  Patient A&O to person, date, time, but not situation.  At times patient is confused and starts crying.  Patient remorseful about not getting COVID vaccine.

## 2020-10-22 NOTE — ED Notes (Signed)
Pt oriented to person only, per husband this is not baseline. Pt in NAD

## 2020-10-22 NOTE — ED Provider Notes (Signed)
[facility] Emergency Department Provider Note  ____________________________________________   Event Date/Time   First MD Initiated Contact with Patient 10/22/20 0818     (approximate)  I have reviewed the triage vital signs and the nursing notes.   HISTORY  Chief Complaint Altered Mental Status    HPI Stacey Ball is a 60 y.o. female with past medical history of bipolar disorder, hypertension, hyperlipidemia, here with altered mental status.  The patient reportedly has been increasingly confused and weak for the last 3 to 4 days.  History is provided primarily with the patient's husband, who accompanies her.  He states that over the last several days, the patient has had increasing confusion.  She seems like she has been "out of it" like she is just coming out of anesthesia.  She said intermittent slurred speech.  She has been falling due to weakness.  Yesterday, she was so weak that she was actually not able to get out of bed.  She had some incontinence of urine which is new as well.  She is also had some increased cough, and she reportedly always "breathes shallow" per her husband.  No known sick contacts at home.  The patient has no known Covid exposures.  She had some diarrhea 3 days ago as well, denies any abdominal pain.  No other acute complaints.  On my assessment, patient is able to tell me who she is but denies any other complaints.  Remainder of history limited due to confusion.        Past Medical History:  Diagnosis Date  . Anemia    HX of  . Arthritis   . Bipolar 1 disorder ([place])   . Cancer ([place])    skin cancer  . Cataracts, bilateral   . Diabetes mellitus without complication ([place])   . Dyspnea    on exertion (Pt states, "out of shape")  . Function kidney decreased   . High cholesterol   . Hypertension   . Hypothyroidism   . Psoriasis   . Psoriatic arthritis ([place])   . Thyroid disease     There are no problems to display for this  patient.   Past Surgical History:  Procedure Laterality Date  . CATARACT EXTRACTION W/PHACO Left 01/10/2020   Procedure: CATARACT EXTRACTION PHACO AND INTRAOCULAR LENS PLACEMENT ([place]) LEFT DIABETIC;  Surgeon: Marchia Meiers, MD;  Location: [place];  Service: Ophthalmology;  Laterality: Left;  5.32 0:41.5  . CATARACT EXTRACTION W/PHACO Right 02/07/2020   Procedure: CATARACT EXTRACTION PHACO AND INTRAOCULAR LENS PLACEMENT ([place]) RIGHT DIABETIC VISION BLUE 4.01 00:27.4;  Surgeon: Marchia Meiers, MD;  Location: [place];  Service: Ophthalmology;  Laterality: Right;  Diabetic - oral meds  . COLONOSCOPY WITH PROPOFOL N/A 03/04/2016   Procedure: COLONOSCOPY WITH PROPOFOL;  Surgeon: Manya Silvas, MD;  Location[facility] ENDOSCOPY;  Service: Endoscopy;  Laterality: N/A;  . great toe amputation    . matrixectomy    . TONSILLECTOMY      Prior to Admission medications   Medication Sig Start Date End Date Taking? Authorizing Provider  Cholecalciferol (VITAMIN D3) 125 MCG (5000 UT) CHEW Chew by mouth. dinner    [provider]  clonazePAM (KLONOPIN) 2 MG tablet Take 1 mg by mouth 2 (two) times daily as needed.     [provider]  Cranberry 500 MG CAPS Take by mouth.    [provider]  diclofenac sodium (VOLTAREN) 1 % GEL Apply topically 4 (four) times daily.  [provider]  divalproex (DEPAKOTE) 500 MG DR tablet Take 500 mg by mouth 2 (two) times daily. breakfast and bedtime    [provider]  Ferrous Sulfate (IRON PO) Take by mouth daily.    [provider]  fluocinonide ointment (LIDEX) [id number] % Apply 1 application topically 2 (two) times daily.    [provider]  glipiZIDE (GLUCOTROL XL) 5 MG 24 hr tablet Take 5 mg by mouth daily with breakfast.    [provider]  Javier Docker Oil 350 MG CAPS Take by mouth. dinner    [provider]  levothyroxine (SYNTHROID, LEVOTHROID) 25 MCG tablet Take 25 mcg by  mouth daily before breakfast.    [provider]  losartan (COZAAR) 25 MG tablet Take 50 mg by mouth daily. bedtime    [provider]  Melatonin 10 MG SUBL Place under the tongue at bedtime.    [provider]  pioglitazone (ACTOS) 15 MG tablet Take 15 mg by mouth daily.    [provider]  Probiotic Product (PROBIOTIC ADVANCED PO) Take by mouth.    [provider]  QUEtiapine (SEROQUEL) 300 MG tablet Take 300 mg by mouth at bedtime.    [provider]    Allergies Iodine solution [povidone iodine], Penicillins, and Shellfish allergy  History reviewed. No pertinent family history.  Social History Social History   Tobacco Use  . Smoking status: Passive Smoke Exposure - Never Smoker  . Smokeless tobacco: Never Used  Vaping Use  . Vaping Use: Never used  Substance Use Topics  . Alcohol use: No  . Drug use: No    Review of Systems  Review of Systems  Unable to perform ROS: Mental status change     ____________________________________________  PHYSICAL EXAM:      VITAL SIGNS: ED Triage Vitals  Enc Vitals Group     BP 10/21/20 2114 (!) 146/66     Pulse Rate 10/21/20 2114 (!) 104     Resp 10/21/20 2114 18     Temp 10/21/20 2114 100.1 F (37.8 C)     Temp Source 10/21/20 2114 Oral     SpO2 10/21/20 2114 96 %     Weight --      Height 10/21/20 2113 5\' 7"  (1.702 m)     Head Circumference --      Peak Flow --      Pain Score 10/22/20 0808 0     Pain Loc --      Pain Edu? --      Excl. in [place]? --      Physical Exam Vitals and nursing note reviewed.  Constitutional:      General: She is not in acute distress.    Appearance: She is well-developed and well-nourished.  HENT:     Head: Normocephalic and atraumatic.     Mouth/Throat:     Mouth: Mucous membranes are dry.  Eyes:     Conjunctiva/sclera: Conjunctivae normal.  Cardiovascular:     Rate and Rhythm: Regular rhythm. Tachycardia present.     Heart sounds:  Normal heart sounds.  Pulmonary:     Effort: Pulmonary effort is normal. No respiratory distress.     Breath sounds: Rales (bibasilar) present. No wheezing.  Abdominal:     General: Abdomen is flat. There is no distension.     Comments: No focal tenderness  Musculoskeletal:        General: No edema.     Cervical back: Neck supple.  Skin:    General: Skin is warm.     Capillary Refill: Capillary refill takes less than 2 seconds.     Findings: No rash.  Neurological:     Mental Status: She is alert. She is disoriented.     Motor: No abnormal muscle tone.     Comments: Oriented to person only, not to time or place.  Cranial nerves intact.  Speech slightly slurred.  Strength out of 5 bilateral upper and lower extremities, though globally weak.  Moderate coarse tremor noted bilateral upper extremities.       ____________________________________________   LABS (all labs ordered are listed, but only abnormal results are displayed)  Labs Reviewed  COMPREHENSIVE METABOLIC PANEL - Abnormal; Notable for the following components:      Result Value   Sodium 130 (*)    Potassium 5.2 (*)    Chloride 93 (*)    Glucose, Bld 139 (*)    Creatinine, Ser 1.50 (*)    Total Protein 8.4 (*)    AST 52 (*)    GFR, Estimated 40 (*)    All other components within normal limits  CBC WITH DIFFERENTIAL/PLATELET - Abnormal; Notable for the following components:   Platelets 149 (*)    Abs Immature Granulocytes 0.19 (*)    All other components within normal limits  URINALYSIS, COMPLETE (UACMP) WITH MICROSCOPIC - Abnormal; Notable for the following components:   Color, Urine YELLOW (*)    APPearance CLEAR (*)    All other components within normal limits  CK - Abnormal; Notable for the following components:   Total CK 754 (*)    All other components within normal limits  URINE DRUG SCREEN, QUALITATIVE ([facility] ONLY) - Abnormal; Notable for the following components:   Tricyclic, Ur Screen POSITIVE (*)     All other components within normal limits  BLOOD GAS, VENOUS - Abnormal; Notable for the following components:   pO2, Ven <31.0 (*)    Bicarbonate 31.1 (*)    Acid-Base Excess 5.3 (*)    All other components within normal limits  CBG MONITORING, ED - Abnormal; Notable for the following components:   Glucose-Capillary 118 (*)    All other components within normal limits  CBG MONITORING, ED - Abnormal; Notable for the following components:   Glucose-Capillary 126 (*)    All other components within normal limits  CULTURE, BLOOD (SINGLE)  URINE CULTURE  CULTURE, BLOOD (SINGLE)  LACTIC ACID, PLASMA  APTT  PROTIME-INR  ETHANOL  VALPROIC ACID LEVEL  LACTIC ACID, PLASMA  AMMONIA  VALPROIC ACID LEVEL  POC SARS CORONAVIRUS 2 AG -  ED    ____________________________________________  EKG: Sinus tachycardia, ventricular rate 105.  PR 158, QRS 92, QTc 430.  No acute ST elevations or depressions.  Left axis deviation. ________________________________________  RADIOLOGY All imaging, including plain films, CT scans, and ultrasounds, independently reviewed by me, and interpretations confirmed via formal radiology reads.  ED MD interpretation:   Chest x-ray: Bibasilar atelectasis noted CT head: No acute intracranial pathology   Official radiology report(s): CT ABDOMEN PELVIS WO CONTRAST  Result Date: 10/22/2020 CLINICAL DATA:  Abdominal pain and fevers EXAM: CT ABDOMEN AND PELVIS WITHOUT CONTRAST TECHNIQUE: Multidetector CT imaging of the abdomen and pelvis was performed following the standard protocol without IV contrast. COMPARISON:  None. FINDINGS: Lower chest: Mild bibasilar atelectasis is noted. Additionally some focal ground-glass densities are seen which are incompletely evaluated on this examination. Correlate with pending COVID-19 testing. Hepatobiliary: Liver is within  normal limits. Gallbladder is well distended with multiple gallstones. No inflammatory changes of the gallbladder are  seen. Pancreas: Unremarkable. No pancreatic ductal dilatation or surrounding inflammatory changes. Spleen: Normal in size without focal abnormality. Adrenals/Urinary Tract: Adrenal glands are within normal limits. No renal calculi or obstructive changes are seen. The ureters are within normal limits bilaterally. The bladder is well distended. Stomach/Bowel: The appendix is within normal limits. No obstructive or inflammatory changes of the large or small bowel are seen. Duodenal diverticulum is noted adjacent to the head of the pancreas. Stomach is unremarkable. Vascular/Lymphatic: Aortic atherosclerosis. No enlarged abdominal or pelvic lymph nodes. Reproductive: Uterus and bilateral adnexa are unremarkable. Other: No abdominal wall hernia or abnormality. No abdominopelvic ascites. Musculoskeletal: No acute or significant osseous findings. IMPRESSION: Cholelithiasis without complicating factors. Bibasilar atelectasis. Ground-glass opacities are also noted in the lower lobes. Correlate with pending COVID-19 testing. Electronically Signed   By: Inez Catalina M.D.   On: 10/22/2020 11:02   DG Chest 2 View  Result Date: 10/21/2020 CLINICAL DATA:  60 year old female with concern for sepsis. EXAM: CHEST - 2 VIEW COMPARISON:  None FINDINGS: No focal consolidation, pleural effusion, or pneumothorax. The cardiac silhouette is within limits. Atherosclerotic calcification of the aortic arch. No acute osseous pathology. Osteopenia with degenerative changes of the spine. IMPRESSION: No active cardiopulmonary disease. Electronically Signed   By: Anner Crete M.D.   On: 10/21/2020 22:07   CT HEAD WO CONTRAST  Result Date: 10/21/2020 CLINICAL DATA:  60 year old female with head trauma. EXAM: CT HEAD WITHOUT CONTRAST TECHNIQUE: Contiguous axial images were obtained from the base of the skull through the vertex without intravenous contrast. COMPARISON:  None. FINDINGS: Evaluation of this exam is limited due to motion  artifact. Brain: The ventricles and sulci appropriate size for patient's age. The gray-white matter discrimination is preserved. There is no acute intracranial hemorrhage. No mass effect or midline shift. No extra-axial fluid collection. Vascular: No hyperdense vessel or unexpected calcification. Skull: Normal. Negative for fracture or focal lesion. Sinuses/Orbits: No acute finding. Other: None IMPRESSION: No acute intracranial pathology. Electronically Signed   By: Anner Crete M.D.   On: 10/21/2020 22:22   DG Chest Portable 1 View  Result Date: 10/22/2020 CLINICAL DATA:  Weakness, mental status, tremors for months, foul smelling urine per EMS, hypertension, diabetes mellitus EXAM: PORTABLE CHEST 1 VIEW COMPARISON:  Portable exam 0910 hours compared to 10/21/2020 FINDINGS: Upper normal size of cardiac silhouette. Mediastinal contours and pulmonary vascularity normal. Bibasilar atelectasis. Upper lungs clear. No definite infiltrate, pleural effusion or pneumothorax. Osseous structures unremarkable. IMPRESSION: Bibasilar atelectasis. Electronically Signed   By: Lavonia Dana M.D.   On: 10/22/2020 09:22    ____________________________________________  PROCEDURES   Procedure(s) performed (including Critical Care):  .1-3 Lead EKG Interpretation Performed by: Duffy Bruce, MD Authorized by: Duffy Bruce, MD     Interpretation: non-specific     ECG rate:  90-110   ECG rate assessment: tachycardic     Rhythm: sinus rhythm     Ectopy: none     Conduction: normal   Comments:     Indication: Shortness of breath, hypoxia .Critical Care Performed by: Duffy Bruce, MD Authorized by: Duffy Bruce, MD   Critical care provider statement:    Critical care time (minutes):  35   Critical care time was exclusive of:  Separately billable procedures and treating other patients and teaching time   Critical care was necessary to treat or prevent imminent or life-threatening deterioration of the  following conditions:  Circulatory failure, cardiac failure, sepsis and respiratory failure   Critical care was time spent personally by me on the following activities:  Development of treatment plan with patient or surrogate, discussions with consultants, evaluation of patient's response to treatment, examination of patient, obtaining history from patient or surrogate, ordering and performing treatments and interventions, ordering and review of laboratory studies, ordering and review of radiographic studies, pulse oximetry, re-evaluation of patient's condition and review of old charts   I assumed direction of critical care for this patient from another provider in my specialty: no      ____________________________________________  INITIAL IMPRESSION / MDM / [place] / ED COURSE  As part of my medical decision making, I reviewed the following data within the [place] notes reviewed and incorporated, Old chart reviewed, Notes from prior ED visits, and [place] Controlled Substance Database       *[place] was evaluated in Emergency Department on 10/22/2020 for the symptoms described in the history of present illness. She was evaluated in the context of the global COVID-19 pandemic, which necessitated consideration that the patient might be at risk for infection with the SARS-CoV-2 virus that causes COVID-19. Institutional protocols and algorithms that pertain to the evaluation of patients at risk for COVID-19 are in a state of rapid change based on information released by regulatory bodies including the CDC and federal and state organizations. These policies and algorithms were followed during the patient's care in the ED.  Some ED evaluations and interventions may be delayed as a result of limited staffing during the pandemic.*     Medical Decision Making: 60 year old female here with generalized weakness, cough, and tremors.  Patient unable to ambulate.  She  has increasing confusion as well.  Regarding her confusion, I suspect this is related to acute encephalopathy from infectious process.  CT head shows no acute abnormality.  She has no focal neurological deficits.  Lab work reviewed, is overall fairly reassuring.  CMP shows likely AKI with mild potassium elevation in creatinine of 1.5.  She has not been eating and drinking much.  White count normal.  CK mildly elevated at 754, likely related to her recent falls and she has been given IV fluids.  Urinalysis is without signs of UTI.  Venous blood gas without signs of retention.  Chest x-ray does show bibasilar atelectasis, and CT of the abdomen and pelvis obtained secondary to reported abdominal pain and diarrhea.  This is concerning for possible pneumonia.  However, patient also had a positive Covid so I suspect this is primary reason for her symptoms.  Will treat with remdesivir, steroids, and admit.  ____________________________________________  FINAL CLINICAL IMPRESSION(S) / ED DIAGNOSES  Final diagnoses:  Confusion  Acute hypoxemic respiratory failure due to COVID-19 [facility])  Acute encephalopathy     MEDICATIONS GIVEN DURING THIS VISIT:  Medications  lactated ringers infusion (0 mLs Intravenous Hold 10/22/20 1122)  cefTRIAXone (ROCEPHIN) 2 g in sodium chloride 0.9 % 100 mL IVPB (0 g Intravenous Stopped 10/22/20 1119)  azithromycin (ZITHROMAX) 500 mg in sodium chloride 0.9 % 250 mL IVPB (500 mg Intravenous New Bag/Given 10/22/20 1125)  sodium chloride 0.9 % bolus 1,000 mL (1,000 mLs Intravenous New Bag/Given 10/22/20 1017)  acetaminophen (TYLENOL) tablet 1,000 mg (1,000 mg Oral Given 10/22/20 0950)  methylPREDNISolone sodium succinate (SOLU-MEDROL) 125 mg/2 mL injection 125 mg (125 mg Intravenous Given 10/22/20 1121)     ED Discharge Orders    None  Note:  This document was prepared using Dragon voice recognition software and may include unintentional dictation errors.   Shaune Pollack,  MD 10/22/20 [phone]

## 2020-10-22 NOTE — ED Notes (Signed)
Pt oriented to person only at this time

## 2020-10-22 NOTE — ED Notes (Signed)
Lab called and informed of the valproic acid and ethanol level add-on.

## 2020-10-22 NOTE — Consult Note (Signed)
Remdesivir - Pharmacy Brief Note   O:  ALT: 18 CXR: Bibasilar atelectasis.Covid + SpO2: 97% on RA --> 2L [place] 100% sat   A/P:  Remdesivir 200 mg IVPB once followed by 100 mg IVPB daily x 4 days.   Martyn Malay, [facility] 10/22/2020 11:37 AM

## 2020-10-23 DIAGNOSIS — J1282 Pneumonia due to coronavirus disease 2019: Secondary | ICD-10-CM | POA: Diagnosis not present

## 2020-10-23 DIAGNOSIS — G9340 Encephalopathy, unspecified: Secondary | ICD-10-CM

## 2020-10-23 DIAGNOSIS — U071 COVID-19: Principal | ICD-10-CM

## 2020-10-23 DIAGNOSIS — F319 Bipolar disorder, unspecified: Secondary | ICD-10-CM | POA: Diagnosis not present

## 2020-10-23 LAB — COMPREHENSIVE METABOLIC PANEL
ALT: 16 U/L (ref 0–44)
AST: 34 U/L (ref 15–41)
Albumin: 2.9 g/dL — ABNORMAL LOW (ref 3.5–5.0)
Alkaline Phosphatase: 45 U/L (ref 38–126)
Anion gap: 9 (ref 5–15)
BUN: 25 mg/dL — ABNORMAL HIGH (ref 6–20)
CO2: 28 mmol/L (ref 22–32)
Calcium: 9.1 mg/dL (ref 8.9–10.3)
Chloride: 95 mmol/L — ABNORMAL LOW (ref 98–111)
Creatinine, Ser: 1.39 mg/dL — ABNORMAL HIGH (ref 0.44–1.00)
GFR, Estimated: 44 mL/min — ABNORMAL LOW (ref >60)
Glucose, Bld: 207 mg/dL — ABNORMAL HIGH (ref 70–99)
Potassium: 4.6 mmol/L (ref 3.5–5.1)
Sodium: 132 mmol/L — ABNORMAL LOW (ref 135–145)
Total Bilirubin: 0.5 mg/dL (ref 0.3–1.2)
Total Protein: 6.7 g/dL (ref 6.5–8.1)

## 2020-10-23 LAB — D-DIMER, QUANTITATIVE: D-Dimer, Quant: 1.29 ug/mL-FEU — ABNORMAL HIGH (ref 0.00–0.50)

## 2020-10-23 LAB — CBC WITH DIFFERENTIAL/PLATELET
Abs Immature Granulocytes: 0.18 10*3/uL — ABNORMAL HIGH (ref 0.00–0.07)
Basophils Absolute: 0 10*3/uL (ref 0.0–0.1)
Basophils Relative: 0 %
Eosinophils Absolute: 0 10*3/uL (ref 0.0–0.5)
Eosinophils Relative: 0 %
HCT: 37 % (ref 36.0–46.0)
Hemoglobin: 12.3 g/dL (ref 12.0–15.0)
Immature Granulocytes: 2 %
Lymphocytes Relative: 11 %
Lymphs Abs: 0.8 10*3/uL (ref 0.7–4.0)
MCH: 30.8 pg (ref 26.0–34.0)
MCHC: 33.2 g/dL (ref 30.0–36.0)
MCV: 92.5 fL (ref 80.0–100.0)
Monocytes Absolute: 0.5 10*3/uL (ref 0.1–1.0)
Monocytes Relative: 7 %
Neutro Abs: 5.8 10*3/uL (ref 1.7–7.7)
Neutrophils Relative %: 80 %
Platelets: 152 10*3/uL (ref 150–400)
RBC: 4 MIL/uL (ref 3.87–5.11)
RDW: 12.8 % (ref 11.5–15.5)
WBC: 7.4 10*3/uL (ref 4.0–10.5)
nRBC: 0 % (ref 0.0–0.2)

## 2020-10-23 LAB — MAGNESIUM: Magnesium: 1.5 mg/dL — ABNORMAL LOW (ref 1.7–2.4)

## 2020-10-23 LAB — CBG MONITORING, ED
Glucose-Capillary: 182 mg/dL — ABNORMAL HIGH (ref 70–99)
Glucose-Capillary: 216 mg/dL — ABNORMAL HIGH (ref 70–99)
Glucose-Capillary: 203 mg/dL — ABNORMAL HIGH (ref 70–99)

## 2020-10-23 LAB — PHOSPHORUS: Phosphorus: 3.9 mg/dL (ref 2.5–4.6)

## 2020-10-23 LAB — GLUCOSE, CAPILLARY: Glucose-Capillary: 229 mg/dL — ABNORMAL HIGH (ref 70–99)

## 2020-10-23 LAB — C-REACTIVE PROTEIN: CRP: 1.9 mg/dL — ABNORMAL HIGH (ref <1.0)

## 2020-10-23 LAB — FERRITIN: Ferritin: 366 ng/mL — ABNORMAL HIGH (ref 11–307)

## 2020-10-23 LAB — HIV ANTIBODY (ROUTINE TESTING W REFLEX): HIV Screen 4th Generation wRfx: NONREACTIVE

## 2020-10-23 NOTE — Progress Notes (Signed)
PROGRESS NOTE    Stacey Ball  ZOX:[id number] DOB: January 17, 1961 DOA: 10/22/2020 PCP: Marisue Ivan, MD    Brief Narrative:  Stacey Ball is a 60 y.o. female with medical history significant for bipolar disorder, htn, dm, ckd 3, hypothyroid, who presents with the above.  Unvaccinated. Has had a cough for several months that is stable. Husband does think maybe a little more "winded" the past few days. Most notable is increased confusion for a few days  Initial concern for bacterial pneumonia so ceftriaxone and azithromycin ordered. covid resulted positive. Hypoxic to 89% on room air, improved to normal on 2 L. Labs and chest x ray ordered. Patient complained of abd pain to EDP and so ct of abd/pelvis obtained, no acute findings.    Consultants:    Procedures:   Antimicrobials:      Subjective: Feels sob, but little better  Objective: Vitals:   10/23/20 1400 10/23/20 1600 10/23/20 1645 10/23/20 1730  BP: 137/83 (!) 127/54  114/71  Pulse: 94 94 88 85  Resp: 17 17 15 16   Temp:    98 F (36.7 C)  TempSrc:      SpO2: 97% 96% 98% 99%  Height:       No intake or output data in the 24 hours ending 10/23/20 2018 There were no vitals filed for this visit.  Examination:  General exam: Appears calm and comfortable  Respiratory system: rhonchi, no wheezing Cardiovascular system: S1 & S2 heard, RRR. No JVD, murmurs, rubs, gallops or clicks.. Gastrointestinal system: Abdomen is nondistended, soft and nontender. Normal bowel sounds heard. Central nervous system: grossly intact, alert. And awake Extremities no edema    Data Reviewed: I have personally reviewed following labs and imaging studies  CBC: Recent Labs  Lab 10/21/20 2137 10/23/20 0540  WBC 5.4 7.4  NEUTROABS 4.0 5.8  HGB 13.1 12.3  HCT 38.6 37.0  MCV 91.9 92.5  PLT 149* 152   Basic Metabolic Panel: Recent Labs  Lab 10/21/20 2137 10/22/20 1643 10/23/20 0540  NA 130* 130* 132*  K 5.2* 4.9 4.6   CL 93* 96* 95*  CO2 26 25 28   GLUCOSE 139* 215* 207*  BUN 20 22* 25*  CREATININE 1.50* 1.39* 1.39*  CALCIUM 9.5 8.6* 9.1  MG  --   --  1.5*  PHOS  --   --  3.9   GFR: CrCl cannot be calculated (Unknown ideal weight.). Liver Function Tests: Recent Labs  Lab 10/21/20 2137 10/23/20 0540  AST 52* 34  ALT 18 16  ALKPHOS 56 45  BILITOT 0.8 0.5  PROT 8.4* 6.7  ALBUMIN 4.0 2.9*   No results for input(s): LIPASE, AMYLASE in the last 168 hours. Recent Labs  Lab 10/22/20 1011  AMMONIA 10   Coagulation Profile: Recent Labs  Lab 10/21/20 2137  INR 0.9   Cardiac Enzymes: Recent Labs  Lab 10/21/20 2137 10/22/20 1643  CKTOTAL 754* 415*   BNP (last 3 results) No results for input(s): PROBNP in the last 8760 hours. HbA1C: Recent Labs    10/21/20 2137  HGBA1C 6.5*   CBG: Recent Labs  Lab 10/22/20 0832 10/22/20 1617 10/23/20 0756 10/23/20 1214 10/23/20 1733  GLUCAP 126* 200* 182* 216* 203*   Lipid Profile: No results for input(s): CHOL, HDL, LDLCALC, TRIG, CHOLHDL, LDLDIRECT in the last 72 hours. Thyroid Function Tests: No results for input(s): TSH, T4TOTAL, FREET4, T3FREE, THYROIDAB in the last 72 hours. Anemia Panel: Recent Labs    10/23/20 0540  FERRITIN 366*   Sepsis Labs: Recent Labs  Lab 10/21/20 2137 10/22/20 1011  LATICACIDVEN 1.3 1.3    Recent Results (from the past 240 hour(s))  Blood culture (routine single)     Status: None (Preliminary result)   Collection Time: 10/21/20  9:38 PM   Specimen: BLOOD  Result Value Ref Range Status   Specimen Description BLOOD RIGHT ASSIST CONTROL  Final   Special Requests   Final    BOTTLES DRAWN AEROBIC AND ANAEROBIC Blood Culture results may not be optimal due to an excessive volume of blood received in culture bottles   Culture   Final    NO GROWTH 2 DAYS Performed at [facility], [address] [place], [place] [address]    Report Status PENDING  Incomplete  Urine culture     Status:  Abnormal (Preliminary result)   Collection Time: 10/22/20  9:24 AM   Specimen: In/Out Cath Urine  Result Value Ref Range Status   Specimen Description   Final    IN/OUT CATH URINE Performed at [facility], [address], [place], [place] [address]    Special Requests   Final    NONE Performed at [facility], [address] [place], [place] [address]    Culture (A)  Final    30,000 COLONIES/mL CITROBACTER AMALONATICUS 70,000 COLONIES/mL VIRIDANS STREPTOCOCCUS SUSCEPTIBILITIES TO FOLLOW Performed at [facility], [place] [address] [place], [place] [address]    Report Status PENDING  Incomplete  Blood culture (single)     Status: None (Preliminary result)   Collection Time: 10/22/20 10:11 AM   Specimen: BLOOD  Result Value Ref Range Status   Specimen Description BLOOD RIGHT AC  Final   Special Requests   Final    BOTTLES DRAWN AEROBIC AND ANAEROBIC Blood Culture adequate volume   Culture   Final    NO GROWTH < 24 HOURS Performed at [facility], [address] [place],  [address]    Report Status PENDING  Incomplete         Radiology Studies: CT ABDOMEN PELVIS WO CONTRAST  Result Date: 10/22/2020 CLINICAL DATA:  Abdominal pain and fevers EXAM: CT ABDOMEN AND PELVIS WITHOUT CONTRAST TECHNIQUE: Multidetector CT imaging of the abdomen and pelvis was performed following the standard protocol without IV contrast. COMPARISON:  None. FINDINGS: Lower chest: Mild bibasilar atelectasis is noted. Additionally some focal ground-glass densities are seen which are incompletely evaluated on this examination. Correlate with pending COVID-19 testing. Hepatobiliary: Liver is within normal limits. Gallbladder is well distended with multiple gallstones. No inflammatory changes of the gallbladder are seen. Pancreas: Unremarkable. No pancreatic ductal dilatation or surrounding inflammatory changes. Spleen: Normal in size without focal abnormality.  Adrenals/Urinary Tract: Adrenal glands are within normal limits. No renal calculi or obstructive changes are seen. The ureters are within normal limits bilaterally. The bladder is well distended. Stomach/Bowel: The appendix is within normal limits. No obstructive or inflammatory changes of the large or small bowel are seen. Duodenal diverticulum is noted adjacent to the head of the pancreas. Stomach is unremarkable. Vascular/Lymphatic: Aortic atherosclerosis. No enlarged abdominal or pelvic lymph nodes. Reproductive: Uterus and bilateral adnexa are unremarkable. Other: No abdominal wall hernia or abnormality. No abdominopelvic ascites. Musculoskeletal: No acute or significant osseous findings. IMPRESSION: Cholelithiasis without complicating factors. Bibasilar atelectasis. Ground-glass opacities are also noted in the lower lobes. Correlate with pending COVID-19 testing. Electronically Signed   By: Inez Catalina M.D.   On: 10/22/2020 11:02  DG Chest 2 View  Result Date: 10/21/2020 CLINICAL DATA:  60 year old female with concern for sepsis. EXAM: CHEST - 2 VIEW COMPARISON:  None FINDINGS: No focal consolidation, pleural effusion, or pneumothorax. The cardiac silhouette is within limits. Atherosclerotic calcification of the aortic arch. No acute osseous pathology. Osteopenia with degenerative changes of the spine. IMPRESSION: No active cardiopulmonary disease. Electronically Signed   By: Anner Crete M.D.   On: 10/21/2020 22:07   CT HEAD WO CONTRAST  Result Date: 10/21/2020 CLINICAL DATA:  60 year old female with head trauma. EXAM: CT HEAD WITHOUT CONTRAST TECHNIQUE: Contiguous axial images were obtained from the base of the skull through the vertex without intravenous contrast. COMPARISON:  None. FINDINGS: Evaluation of this exam is limited due to motion artifact. Brain: The ventricles and sulci appropriate size for patient's age. The gray-white matter discrimination is preserved. There is no acute  intracranial hemorrhage. No mass effect or midline shift. No extra-axial fluid collection. Vascular: No hyperdense vessel or unexpected calcification. Skull: Normal. Negative for fracture or focal lesion. Sinuses/Orbits: No acute finding. Other: None IMPRESSION: No acute intracranial pathology. Electronically Signed   By: Anner Crete M.D.   On: 10/21/2020 22:22   DG Chest Portable 1 View  Result Date: 10/22/2020 CLINICAL DATA:  Weakness, mental status, tremors for months, foul smelling urine per EMS, hypertension, diabetes mellitus EXAM: PORTABLE CHEST 1 VIEW COMPARISON:  Portable exam 0910 hours compared to 10/21/2020 FINDINGS: Upper normal size of cardiac silhouette. Mediastinal contours and pulmonary vascularity normal. Bibasilar atelectasis. Upper lungs clear. No definite infiltrate, pleural effusion or pneumothorax. Osseous structures unremarkable. IMPRESSION: Bibasilar atelectasis. Electronically Signed   By: Lavonia Dana M.D.   On: 10/22/2020 09:22        Scheduled Meds: . dexamethasone  6 mg Oral Daily  . divalproex  250 mg Oral BID  . enoxaparin (LOVENOX) injection  40 mg Subcutaneous Q24H  . insulin aspart  0-15 Units Subcutaneous TID WC  . levothyroxine  25 mcg Oral QAC breakfast  . QUEtiapine  150 mg Oral QHS   Continuous Infusions: . remdesivir 100 mg in NS 100 mL Stopped (10/23/20 1030)    Assessment & Plan:   Active Problems:   Pneumonia due to COVID-19 virus   Bipolar disorder ([place])   Acute encephalopathy   # Covid pneumonia # Acute hypoxic respiratory failure Unvaccinated Hypoxic here to 89, febrile to 102. CXR with bibasilar opacities. 1/6-continue with f/u inflamm. Markers Continue with Remdesivir, decadron incsentive spirometer   # Elevated CK Likely 2/2 immobility.  Improving with ivf Off ivf now 2/2 having covid. Continue monitoring    # Hyperkalemia-mild Improved. k 4.6  # Acute encephalopathy Likely 2/2 acute infection and hypoxia,  with baseline severe depression contributory 1/6-slow improvement Hold Klonipine   # Bipolar disorder Severe, appears lately not coping well, spends most of every day in bed - cont home seroquel, depakote but will reduce home dose by half given somnolence - hold home klonipen  # T2DM BG mildly elevated Continue RISS   # HTN Here bp wnl - hold home losartan  # hypothyroid - cont home synthroid     DVT prophylaxis: lovenox Code Status:full  Status is: Inpatient  Remains inpatient appropriate because:Inpatient level of care appropriate due to severity of illness   Dispo: The patient is from: Home              Anticipated d/c is to: Home  Anticipated d/c date is: 3 days              Patient currently is not medically stable to d/c.            LOS: 1 day   Time spent: 35 min with >50% on coc    Nolberto Hanlon, MD [facility] Pager [phone]  If 7PM-7AM, please contact night-coverage 10/23/2020, 8:18 PM

## 2020-10-23 NOTE — Progress Notes (Signed)
Inpatient Diabetes Program Recommendations  AACE/ADA: New Consensus Statement on Inpatient Glycemic Control (2015)  Target Ranges:  Prepandial:   less than 140 mg/dL      Peak postprandial:   less than 180 mg/dL (1-2 hours)      Critically ill patients:  140 - 180 mg/dL   Lab Results  Component Value Date   GLUCAP 216 (H) 10/23/2020   HGBA1C 6.5 (H) 10/21/2020    Review of Glycemic Control Results for [name], [name] (MRN [id number]) as of 10/23/2020 12:59  Ref. Range 10/21/2020 21:19 10/22/2020 08:32 10/22/2020 16:17 10/23/2020 07:56 10/23/2020 12:14  Glucose-Capillary Latest Ref Range: 70 - 99 mg/dL 163 (H) 845 (H) 364 (H) 182 (H) 216 (H)   Diabetes history: DM 2 Outpatient Diabetes medications:  Actos 15 mg daily Current orders for Inpatient glycemic control:  Novolog moderate tid with meals Decadron 6 mg daily Inpatient Diabetes Program Recommendations:   While on Decadron, consider adding Levemir 8 units bid.    Thanks  Stacey Meager, RN, BC-ADM Inpatient Diabetes Coordinator Pager [phone] (8a-5p)

## 2020-10-23 NOTE — ED Notes (Signed)
As per daniel, RN pts room on the floor is still being cleaned.

## 2020-10-24 DIAGNOSIS — J1282 Pneumonia due to coronavirus disease 2019: Secondary | ICD-10-CM | POA: Diagnosis not present

## 2020-10-24 DIAGNOSIS — U071 COVID-19: Secondary | ICD-10-CM | POA: Diagnosis not present

## 2020-10-24 DIAGNOSIS — G9340 Encephalopathy, unspecified: Secondary | ICD-10-CM | POA: Diagnosis not present

## 2020-10-24 LAB — COMPREHENSIVE METABOLIC PANEL
ALT: 16 U/L (ref 0–44)
AST: 29 U/L (ref 15–41)
Albumin: 3 g/dL — ABNORMAL LOW (ref 3.5–5.0)
Alkaline Phosphatase: 45 U/L (ref 38–126)
Anion gap: 10 (ref 5–15)
BUN: 31 mg/dL — ABNORMAL HIGH (ref 6–20)
CO2: 25 mmol/L (ref 22–32)
Calcium: 9.1 mg/dL (ref 8.9–10.3)
Chloride: 98 mmol/L (ref 98–111)
Creatinine, Ser: 1.27 mg/dL — ABNORMAL HIGH (ref 0.44–1.00)
GFR, Estimated: 49 mL/min — ABNORMAL LOW (ref >60)
Glucose, Bld: 225 mg/dL — ABNORMAL HIGH (ref 70–99)
Potassium: 4.9 mmol/L (ref 3.5–5.1)
Sodium: 133 mmol/L — ABNORMAL LOW (ref 135–145)
Total Bilirubin: 0.6 mg/dL (ref 0.3–1.2)
Total Protein: 6.8 g/dL (ref 6.5–8.1)

## 2020-10-24 LAB — URINE CULTURE: Culture: 30000 — AB

## 2020-10-24 LAB — CBC WITH DIFFERENTIAL/PLATELET
Abs Immature Granulocytes: 0.21 10*3/uL — ABNORMAL HIGH (ref 0.00–0.07)
Basophils Absolute: 0 10*3/uL (ref 0.0–0.1)
Basophils Relative: 0 %
Eosinophils Absolute: 0 10*3/uL (ref 0.0–0.5)
Eosinophils Relative: 0 %
HCT: 36.5 % (ref 36.0–46.0)
Hemoglobin: 12.1 g/dL (ref 12.0–15.0)
Immature Granulocytes: 2 %
Lymphocytes Relative: 10 %
Lymphs Abs: 1 10*3/uL (ref 0.7–4.0)
MCH: 30.7 pg (ref 26.0–34.0)
MCHC: 33.2 g/dL (ref 30.0–36.0)
MCV: 92.6 fL (ref 80.0–100.0)
Monocytes Absolute: 0.7 10*3/uL (ref 0.1–1.0)
Monocytes Relative: 8 %
Neutro Abs: 7.6 10*3/uL (ref 1.7–7.7)
Neutrophils Relative %: 80 %
Platelets: 209 10*3/uL (ref 150–400)
RBC: 3.94 MIL/uL (ref 3.87–5.11)
RDW: 12.9 % (ref 11.5–15.5)
WBC: 9.5 10*3/uL (ref 4.0–10.5)
nRBC: 0 % (ref 0.0–0.2)

## 2020-10-24 LAB — C-REACTIVE PROTEIN: CRP: 0.9 mg/dL (ref <1.0)

## 2020-10-24 LAB — PHOSPHORUS: Phosphorus: 2.8 mg/dL (ref 2.5–4.6)

## 2020-10-24 LAB — MAGNESIUM: Magnesium: 1.7 mg/dL (ref 1.7–2.4)

## 2020-10-24 LAB — GLUCOSE, CAPILLARY
Glucose-Capillary: 175 mg/dL — ABNORMAL HIGH (ref 70–99)
Glucose-Capillary: 207 mg/dL — ABNORMAL HIGH (ref 70–99)

## 2020-10-24 LAB — FERRITIN: Ferritin: 364 ng/mL — ABNORMAL HIGH (ref 11–307)

## 2020-10-24 LAB — D-DIMER, QUANTITATIVE: D-Dimer, Quant: 0.55 ug/mL-FEU — ABNORMAL HIGH (ref 0.00–0.50)

## 2020-10-24 MED ORDER — FAMOTIDINE 20 MG PO TABS: 20.0000 mg | ORAL_TABLET | Freq: Every day | ORAL | 0 refills | Status: AC

## 2020-10-24 MED ORDER — QUETIAPINE FUMARATE 300 MG PO TABS: 150.0000 mg | ORAL_TABLET | Freq: Every day | ORAL | Status: AC

## 2020-10-24 MED ORDER — PREDNISONE 20 MG PO TABS: 20.0000 mg | ORAL_TABLET | Freq: Every day | ORAL | 0 refills | Status: AC

## 2020-10-24 NOTE — Discharge Summary (Signed)
Stacey Ball [id number] DOB: 05-26-1961 DOA: 10/22/2020  PCP: Stacey Body, MD  Admit date: 10/22/2020 Discharge date: 10/24/2020  Admitted From: home Disposition:  home  Recommendations for Outpatient Follow-up:  1. Follow up with PCP in 1 week 2. Please obtain BMP/CBC in one week 3. Please follow up on the following pending results:      Discharge Condition:Stable CODE STATUS:full  Diet recommendation: Heart Healthy Brief/Interim Summary: Per HPI: Stacey Ball is a 60 y.o. female with medical history significant for bipolar disorder, htn, dm, ckd 3, hypothyroid, who presents with confusion and dyspnea.  She is unvaccinated against COVID 19..In ER she was found to be hypoxic to 89% on room air, improved to normal on 2 L. Labs    SARS COVID positive test.  Was admitted for treatment of covid pneumonia.  # Covid pneumonia # Acute hypoxic respiratory failure Unvaccinated Hypoxic here to 89, febrile to 102. CXR with bibasilar opacities. Was started on remdesivir and decadron...>She is off oxygen.  With ambulation she was about 90.   # Elevated CK Likely 2/2 immobility.  Improved with ivf    # Hyperkalemia-mild Improved. Low potassium diet recommended Her ARB held, will need to f/u with pcp for bp management as her bp stable off meds here  # Acute encephalopathy Likely 2/2 acute infection and hypoxia, with baseline severe depression contributory Improved   # Bipolar disorder Needs to follow up with pcp for adjustment of meds.   # T2DM Resume home meds Carb control  # HTN As above  # hypothyroid - cont home synthroid     Discharge Diagnoses:  Active Problems:   Pneumonia due to COVID-19 virus   Bipolar disorder [facility])   Acute encephalopathy    Discharge Instructions  Discharge Instructions    Call MD for:  difficulty breathing, headache or visual disturbances   Complete by: As directed    Diet - low sodium heart healthy    Complete by: As directed    Diet Carb Modified   Complete by: As directed    Discharge instructions   Complete by: As directed    Follow up with pcp about blood pressure meds Decrease seroquel to 150mg  daily Follow low potassium diet   Increase activity slowly   Complete by: As directed      Allergies as of 10/24/2020      Reactions   Iodine Solution [povidone Iodine] Other (See Comments)   States it was used on toes and face broke out   Penicillins Rash   Shellfish Allergy Rash      Medication List    STOP taking these medications   clonazePAM 2 MG tablet Commonly known as: KLONOPIN   Krill Oil 350 MG Caps   losartan 50 MG tablet Commonly known as: COZAAR     TAKE these medications   diclofenac sodium 1 % Gel Commonly known as: VOLTAREN Apply topically 4 (four) times daily.   divalproex 250 MG DR tablet Commonly known as: DEPAKOTE Take 500 mg by mouth 2 (two) times daily. breakfast and bedtime   famotidine 20 MG tablet Commonly known as: PEPCID Take 1 tablet (20 mg total) by mouth daily.   fluocinonide ointment 0.05 % Commonly known as: LIDEX Apply 1 application topically 2 (two) times daily.   levothyroxine 100 MCG tablet Commonly known as: SYNTHROID Take 100 mcg by mouth daily before breakfast.   Melatonin 10 MG Subl Place under the tongue at bedtime.   pioglitazone 15 MG tablet  Commonly known as: ACTOS Take 15 mg by mouth daily.   predniSONE 20 MG tablet Commonly known as: DELTASONE Take 1 tablet (20 mg total) by mouth daily for 6 doses.   QUEtiapine 300 MG tablet Commonly known as: SEROQUEL Take 0.5 tablets (150 mg total) by mouth at bedtime. What changed: how much to take   Vitamin D3 125 MCG (5000 UT) Chew Chew by mouth. dinner       Follow-up Information    Stacey Body, MD Follow up in 1 week(s).   Specialty: Family Medicine Contact information: [place]  [address] [phone]              Allergies  Allergen Reactions  . Iodine Solution [Povidone Iodine] Other (See Comments)    States it was used on toes and face broke out  . Penicillins Rash  . Shellfish Allergy Rash    Consultations:     Procedures/Studies: CT ABDOMEN PELVIS WO CONTRAST  Result Date: 10/22/2020 CLINICAL DATA:  Abdominal pain and fevers EXAM: CT ABDOMEN AND PELVIS WITHOUT CONTRAST TECHNIQUE: Multidetector CT imaging of the abdomen and pelvis was performed following the standard protocol without IV contrast. COMPARISON:  None. FINDINGS: Lower chest: Mild bibasilar atelectasis is noted. Additionally some focal ground-glass densities are seen which are incompletely evaluated on this examination. Correlate with pending COVID-19 testing. Hepatobiliary: Liver is within normal limits. Gallbladder is well distended with multiple gallstones. No inflammatory changes of the gallbladder are seen. Pancreas: Unremarkable. No pancreatic ductal dilatation or surrounding inflammatory changes. Spleen: Normal in size without focal abnormality. Adrenals/Urinary Tract: Adrenal glands are within normal limits. No renal calculi or obstructive changes are seen. The ureters are within normal limits bilaterally. The bladder is well distended. Stomach/Bowel: The appendix is within normal limits. No obstructive or inflammatory changes of the large or small bowel are seen. Duodenal diverticulum is noted adjacent to the head of the pancreas. Stomach is unremarkable. Vascular/Lymphatic: Aortic atherosclerosis. No enlarged abdominal or pelvic lymph nodes. Reproductive: Uterus and bilateral adnexa are unremarkable. Other: No abdominal wall hernia or abnormality. No abdominopelvic ascites. Musculoskeletal: No acute or significant osseous findings. IMPRESSION: Cholelithiasis without complicating factors. Bibasilar atelectasis. Ground-glass opacities are also noted in the lower lobes. Correlate with pending  COVID-19 testing. Electronically Signed   By: Inez Catalina M.D.   On: 10/22/2020 11:02   DG Chest 2 View  Result Date: 10/21/2020 CLINICAL DATA:  60 year old female with concern for sepsis. EXAM: CHEST - 2 VIEW COMPARISON:  None FINDINGS: No focal consolidation, pleural effusion, or pneumothorax. The cardiac silhouette is within limits. Atherosclerotic calcification of the aortic arch. No acute osseous pathology. Osteopenia with degenerative changes of the spine. IMPRESSION: No active cardiopulmonary disease. Electronically Signed   By: Anner Crete M.D.   On: 10/21/2020 22:07   CT HEAD WO CONTRAST  Result Date: 10/21/2020 CLINICAL DATA:  60 year old female with head trauma. EXAM: CT HEAD WITHOUT CONTRAST TECHNIQUE: Contiguous axial images were obtained from the base of the skull through the vertex without intravenous contrast. COMPARISON:  None. FINDINGS: Evaluation of this exam is limited due to motion artifact. Brain: The ventricles and sulci appropriate size for patient's age. The gray-white matter discrimination is preserved. There is no acute intracranial hemorrhage. No mass effect or midline shift. No extra-axial fluid collection. Vascular: No hyperdense vessel or unexpected calcification. Skull: Normal. Negative for fracture or focal lesion. Sinuses/Orbits: No acute finding. Other: None IMPRESSION: No acute intracranial pathology. Electronically Signed  By: Anner Crete M.D.   On: 10/21/2020 22:22   DG Chest Portable 1 View  Result Date: 10/22/2020 CLINICAL DATA:  Weakness, mental status, tremors for months, foul smelling urine per EMS, hypertension, diabetes mellitus EXAM: PORTABLE CHEST 1 VIEW COMPARISON:  Portable exam 0910 hours compared to 10/21/2020 FINDINGS: Upper normal size of cardiac silhouette. Mediastinal contours and pulmonary vascularity normal. Bibasilar atelectasis. Upper lungs clear. No definite infiltrate, pleural effusion or pneumothorax. Osseous structures unremarkable.  IMPRESSION: Bibasilar atelectasis. Electronically Signed   By: Lavonia Dana M.D.   On: 10/22/2020 09:22       Subjective: No complaints. Feels better  Discharge Exam: Vitals:   10/24/20 0457 10/24/20 0855  BP: 136/61   Pulse: 82   Resp: 16   Temp: (!) 97.4 F (36.3 C)   SpO2: 95% 94%   Vitals:   10/23/20 2036 10/23/20 2328 10/24/20 0457 10/24/20 0855  BP: (!) 141/76 121/75 136/61   Pulse: (!) 102 93 82   Resp: 16 14 16    Temp: 97.9 F (36.6 C) 97.6 F (36.4 C) (!) 97.4 F (36.3 C)   TempSrc:      SpO2: 97% 97% 95% 94%  Weight:   96.9 kg   Height:        General: Pt is alert, awake and oriented x4, not in acute distress Cardiovascular: RRR, S1/S2 +, no rubs, no gallops Respiratory: CTA bilaterally, no wheezing, no rhonchi Abdominal: Soft, NT, ND, bowel sounds + Extremities: no edema, no cyanosis    The results of significant diagnostics from this hospitalization (including imaging, microbiology, ancillary and laboratory) are listed below for reference.     Microbiology: Recent Results (from the past 240 hour(s))  Blood culture (routine single)     Status: None (Preliminary result)   Collection Time: 10/21/20  9:38 PM   Specimen: BLOOD  Result Value Ref Range Status   Specimen Description BLOOD RIGHT ASSIST CONTROL  Final   Special Requests   Final    BOTTLES DRAWN AEROBIC AND ANAEROBIC Blood Culture results may not be optimal due to an excessive volume of blood received in culture bottles   Culture   Final    NO GROWTH 3 DAYS Performed at [facility], [address], [place], [place] [address]    Report Status PENDING  Incomplete  Urine culture     Status: Abnormal   Collection Time: 10/22/20  9:24 AM   Specimen: In/Out Cath Urine  Result Value Ref Range Status   Specimen Description   Final    IN/OUT CATH URINE Performed at [facility], [address], [place], [place] [address]    Special Requests   Final    NONE Performed at  [facility], [address] [place], [place] [address]    Culture (A)  Final    30,000 COLONIES/mL CITROBACTER AMALONATICUS 70,000 COLONIES/mL VIRIDANS STREPTOCOCCUS    Report Status 10/24/2020 FINAL  Final   Organism ID, Bacteria CITROBACTER AMALONATICUS (A)  Final      Susceptibility   Citrobacter amalonaticus - MIC*    CEFAZOLIN >=64 RESISTANT Resistant     CEFEPIME <=0.12 SENSITIVE Sensitive     CEFTRIAXONE <=0.25 SENSITIVE Sensitive     CIPROFLOXACIN <=0.25 SENSITIVE Sensitive     GENTAMICIN <=1 SENSITIVE Sensitive     IMIPENEM <=0.25 SENSITIVE Sensitive     NITROFURANTOIN 64 INTERMEDIATE Intermediate     TRIMETH/SULFA <=20 SENSITIVE Sensitive     PIP/TAZO <=4 SENSITIVE Sensitive     *  30,000 COLONIES/mL CITROBACTER AMALONATICUS  Blood culture (single)     Status: None (Preliminary result)   Collection Time: 10/22/20 10:11 AM   Specimen: BLOOD  Result Value Ref Range Status   Specimen Description BLOOD RIGHT [facility]  Final   Special Requests   Final    BOTTLES DRAWN AEROBIC AND ANAEROBIC Blood Culture adequate volume   Culture   Final    NO GROWTH 2 DAYS Performed at [facility], [address] [place], [place] [address]    Report Status PENDING  Incomplete     Labs: BNP (last 3 results) No results for input(s): BNP in the last 8760 hours. Basic Metabolic Panel: Recent Labs  Lab 10/21/20 2137 10/22/20 1643 10/23/20 0540 10/24/20 0456  NA 130* 130* 132* 133*  K 5.2* 4.9 4.6 4.9  CL 93* 96* 95* 98  CO2 26 25 28 25   GLUCOSE 139* 215* 207* 225*  BUN 20 22* 25* 31*  CREATININE 1.50* 1.39* 1.39* 1.27*  CALCIUM 9.5 8.6* 9.1 9.1  MG  --   --  1.5* 1.7  PHOS  --   --  3.9 2.8   Liver Function Tests: Recent Labs  Lab 10/21/20 2137 10/23/20 0540 10/24/20 0456  AST 52* 34 29  ALT 18 16 16   ALKPHOS 56 45 45  BILITOT 0.8 0.5 0.6  PROT 8.4* 6.7 6.8  ALBUMIN 4.0 2.9* 3.0*   No results for input(s): LIPASE, AMYLASE in the last 168  hours. Recent Labs  Lab 10/22/20 1011  AMMONIA 10   CBC: Recent Labs  Lab 10/21/20 2137 10/23/20 0540 10/24/20 0456  WBC 5.4 7.4 9.5  NEUTROABS 4.0 5.8 7.6  HGB 13.1 12.3 12.1  HCT 38.6 37.0 36.5  MCV 91.9 92.5 92.6  PLT 149* 152 209   Cardiac Enzymes: Recent Labs  Lab 10/21/20 2137 10/22/20 1643  CKTOTAL 754* 415*   BNP: Invalid input(s): POCBNP CBG: Recent Labs  Lab 10/23/20 1214 10/23/20 1733 10/23/20 2256 10/24/20 0814 10/24/20 1220  GLUCAP 216* 203* 229* 175* 207*   D-Dimer Recent Labs    10/23/20 0540  DDIMER 1.29*   Hgb A1c Recent Labs    10/21/20 2137  HGBA1C 6.5*   Lipid Profile No results for input(s): CHOL, HDL, LDLCALC, TRIG, CHOLHDL, LDLDIRECT in the last 72 hours. Thyroid function studies No results for input(s): TSH, T4TOTAL, T3FREE, THYROIDAB in the last 72 hours.  Invalid input(s): FREET3 Anemia work up Recent Labs    10/23/20 0540  FERRITIN 366*   Urinalysis    Component Value Date/Time   COLORURINE YELLOW (A) 10/22/2020 0924   APPEARANCEUR CLEAR (A) 10/22/2020 0924   LABSPEC 1.015 10/22/2020 0924   PHURINE 6.0 10/22/2020 [place] 10/22/2020 0924   HGBUR NEGATIVE 10/22/2020 0924   BILIRUBINUR NEGATIVE 10/22/2020 0924   KETONESUR NEGATIVE 10/22/2020 0924   PROTEINUR NEGATIVE 10/22/2020 0924   NITRITE NEGATIVE 10/22/2020 0924   LEUKOCYTESUR NEGATIVE 10/22/2020 0924   Sepsis Labs Invalid input(s): PROCALCITONIN,  WBC,  LACTICIDVEN Microbiology Recent Results (from the past 240 hour(s))  Blood culture (routine single)     Status: None (Preliminary result)   Collection Time: 10/21/20  9:38 PM   Specimen: BLOOD  Result Value Ref Range Status   Specimen Description BLOOD RIGHT ASSIST CONTROL  Final   Special Requests   Final    BOTTLES DRAWN AEROBIC AND ANAEROBIC Blood Culture results may not be optimal due to an excessive volume of blood received in culture bottles  Culture   Final    NO GROWTH 3  DAYS Performed at [facility], [address] [place], [place] [address]    Report Status PENDING  Incomplete  Urine culture     Status: Abnormal   Collection Time: 10/22/20  9:24 AM   Specimen: In/Out Cath Urine  Result Value Ref Range Status   Specimen Description   Final    IN/OUT CATH URINE Performed at [facility], [address], [place], [place] [address]    Special Requests   Final    NONE Performed at [facility], [address] [place], [place] [address]    Culture (A)  Final    30,000 COLONIES/mL CITROBACTER AMALONATICUS 70,000 COLONIES/mL VIRIDANS STREPTOCOCCUS    Report Status 10/24/2020 FINAL  Final   Organism ID, Bacteria CITROBACTER AMALONATICUS (A)  Final      Susceptibility   Citrobacter amalonaticus - MIC*    CEFAZOLIN >=64 RESISTANT Resistant     CEFEPIME <=0.12 SENSITIVE Sensitive     CEFTRIAXONE <=0.25 SENSITIVE Sensitive     CIPROFLOXACIN <=0.25 SENSITIVE Sensitive     GENTAMICIN <=1 SENSITIVE Sensitive     IMIPENEM <=0.25 SENSITIVE Sensitive     NITROFURANTOIN 64 INTERMEDIATE Intermediate     TRIMETH/SULFA <=20 SENSITIVE Sensitive     PIP/TAZO <=4 SENSITIVE Sensitive     * 30,000 COLONIES/mL CITROBACTER AMALONATICUS  Blood culture (single)     Status: None (Preliminary result)   Collection Time: 10/22/20 10:11 AM   Specimen: BLOOD  Result Value Ref Range Status   Specimen Description BLOOD RIGHT [facility]  Final   Special Requests   Final    BOTTLES DRAWN AEROBIC AND ANAEROBIC Blood Culture adequate volume   Culture   Final    NO GROWTH 2 DAYS Performed at [facility], [address], [place], [place] [address]    Report Status PENDING  Incomplete     Time coordinating discharge: Over 30 minutes  SIGNED:   Nolberto Hanlon, MD  [facility] 10/24/2020, 1:05 PM Pager   If 7PM-7AM, please contact night-coverage [email or web address] Password [email or web address]

## 2020-10-26 LAB — CULTURE, BLOOD (SINGLE): Culture: NO GROWTH

## 2020-10-27 LAB — CULTURE, BLOOD (SINGLE)
Culture: NO GROWTH
Special Requests: ADEQUATE

## 2020-10-30 DIAGNOSIS — Z8616 Personal history of COVID-19: Secondary | ICD-10-CM | POA: Diagnosis not present

## 2020-10-30 DIAGNOSIS — I517 Cardiomegaly: Secondary | ICD-10-CM | POA: Diagnosis not present

## 2020-10-30 DIAGNOSIS — R0689 Other abnormalities of breathing: Secondary | ICD-10-CM | POA: Diagnosis not present

## 2020-10-30 DIAGNOSIS — I952 Hypotension due to drugs: Secondary | ICD-10-CM | POA: Diagnosis not present

## 2020-11-24 DIAGNOSIS — K5900 Constipation, unspecified: Secondary | ICD-10-CM | POA: Diagnosis not present

## 2020-11-24 DIAGNOSIS — I10 Essential (primary) hypertension: Secondary | ICD-10-CM | POA: Diagnosis not present

## 2020-11-24 DIAGNOSIS — Z8616 Personal history of COVID-19: Secondary | ICD-10-CM | POA: Diagnosis not present

## 2020-11-24 DIAGNOSIS — M545 Low back pain, unspecified: Secondary | ICD-10-CM | POA: Diagnosis not present

## 2020-12-08 DIAGNOSIS — I959 Hypotension, unspecified: Secondary | ICD-10-CM | POA: Diagnosis not present

## 2020-12-08 DIAGNOSIS — E039 Hypothyroidism, unspecified: Secondary | ICD-10-CM | POA: Diagnosis not present

## 2020-12-08 DIAGNOSIS — E1159 Type 2 diabetes mellitus with other circulatory complications: Secondary | ICD-10-CM | POA: Diagnosis not present

## 2020-12-08 DIAGNOSIS — N183 Chronic kidney disease, stage 3 unspecified: Secondary | ICD-10-CM | POA: Diagnosis not present

## 2020-12-08 DIAGNOSIS — E1122 Type 2 diabetes mellitus with diabetic chronic kidney disease: Secondary | ICD-10-CM | POA: Diagnosis not present

## 2020-12-08 DIAGNOSIS — E1142 Type 2 diabetes mellitus with diabetic polyneuropathy: Secondary | ICD-10-CM | POA: Diagnosis not present

## 2021-02-11 DIAGNOSIS — H40003 Preglaucoma, unspecified, bilateral: Secondary | ICD-10-CM | POA: Diagnosis not present

## 2021-03-24 DIAGNOSIS — F250 Schizoaffective disorder, bipolar type: Secondary | ICD-10-CM | POA: Diagnosis not present

## 2021-06-17 DIAGNOSIS — I10 Essential (primary) hypertension: Secondary | ICD-10-CM | POA: Diagnosis not present

## 2021-06-17 DIAGNOSIS — Z6831 Body mass index (BMI) 31.0-31.9, adult: Secondary | ICD-10-CM | POA: Diagnosis not present

## 2021-06-17 DIAGNOSIS — N1832 Chronic kidney disease, stage 3b: Secondary | ICD-10-CM | POA: Diagnosis not present

## 2021-06-17 DIAGNOSIS — Z0000 Encounter for general adult medical examination without abnormal findings: Secondary | ICD-10-CM | POA: Diagnosis not present

## 2021-06-17 DIAGNOSIS — D638 Anemia in other chronic diseases classified elsewhere: Secondary | ICD-10-CM | POA: Diagnosis not present

## 2021-06-17 DIAGNOSIS — E1142 Type 2 diabetes mellitus with diabetic polyneuropathy: Secondary | ICD-10-CM | POA: Diagnosis not present

## 2021-06-17 DIAGNOSIS — N183 Chronic kidney disease, stage 3 unspecified: Secondary | ICD-10-CM | POA: Diagnosis not present

## 2021-06-17 DIAGNOSIS — E6609 Other obesity due to excess calories: Secondary | ICD-10-CM | POA: Diagnosis not present

## 2021-06-17 DIAGNOSIS — E781 Pure hyperglyceridemia: Secondary | ICD-10-CM | POA: Diagnosis not present

## 2021-06-23 DIAGNOSIS — Z0000 Encounter for general adult medical examination without abnormal findings: Secondary | ICD-10-CM | POA: Diagnosis not present

## 2021-06-23 DIAGNOSIS — N1832 Chronic kidney disease, stage 3b: Secondary | ICD-10-CM | POA: Diagnosis not present

## 2021-06-23 DIAGNOSIS — E1142 Type 2 diabetes mellitus with diabetic polyneuropathy: Secondary | ICD-10-CM | POA: Diagnosis not present

## 2021-06-23 DIAGNOSIS — Z6831 Body mass index (BMI) 31.0-31.9, adult: Secondary | ICD-10-CM | POA: Diagnosis not present

## 2021-06-23 DIAGNOSIS — E781 Pure hyperglyceridemia: Secondary | ICD-10-CM | POA: Diagnosis not present

## 2021-06-23 DIAGNOSIS — D638 Anemia in other chronic diseases classified elsewhere: Secondary | ICD-10-CM | POA: Diagnosis not present

## 2021-06-23 DIAGNOSIS — I10 Essential (primary) hypertension: Secondary | ICD-10-CM | POA: Diagnosis not present

## 2021-06-23 DIAGNOSIS — E6609 Other obesity due to excess calories: Secondary | ICD-10-CM | POA: Diagnosis not present

## 2021-06-25 DIAGNOSIS — E1142 Type 2 diabetes mellitus with diabetic polyneuropathy: Secondary | ICD-10-CM | POA: Diagnosis not present

## 2021-06-29 DIAGNOSIS — E1142 Type 2 diabetes mellitus with diabetic polyneuropathy: Secondary | ICD-10-CM | POA: Diagnosis not present

## 2021-07-01 DIAGNOSIS — E039 Hypothyroidism, unspecified: Secondary | ICD-10-CM | POA: Diagnosis not present

## 2021-07-01 DIAGNOSIS — E1142 Type 2 diabetes mellitus with diabetic polyneuropathy: Secondary | ICD-10-CM | POA: Diagnosis not present

## 2021-07-01 DIAGNOSIS — E1159 Type 2 diabetes mellitus with other circulatory complications: Secondary | ICD-10-CM | POA: Diagnosis not present

## 2021-07-01 DIAGNOSIS — E1122 Type 2 diabetes mellitus with diabetic chronic kidney disease: Secondary | ICD-10-CM | POA: Diagnosis not present

## 2021-07-01 DIAGNOSIS — N183 Chronic kidney disease, stage 3 unspecified: Secondary | ICD-10-CM | POA: Diagnosis not present

## 2021-07-01 DIAGNOSIS — R269 Unspecified abnormalities of gait and mobility: Secondary | ICD-10-CM | POA: Diagnosis not present

## 2021-07-23 DIAGNOSIS — Z8616 Personal history of COVID-19: Secondary | ICD-10-CM | POA: Diagnosis not present

## 2021-07-23 DIAGNOSIS — G629 Polyneuropathy, unspecified: Secondary | ICD-10-CM | POA: Diagnosis not present

## 2021-07-23 DIAGNOSIS — R296 Repeated falls: Secondary | ICD-10-CM | POA: Diagnosis not present

## 2021-07-23 DIAGNOSIS — R2689 Other abnormalities of gait and mobility: Secondary | ICD-10-CM | POA: Diagnosis not present

## 2021-07-23 DIAGNOSIS — F319 Bipolar disorder, unspecified: Secondary | ICD-10-CM | POA: Diagnosis not present

## 2021-07-23 DIAGNOSIS — E669 Obesity, unspecified: Secondary | ICD-10-CM | POA: Diagnosis not present

## 2021-07-23 DIAGNOSIS — R42 Dizziness and giddiness: Secondary | ICD-10-CM | POA: Diagnosis not present

## 2021-07-24 ENCOUNTER — Other Ambulatory Visit: Payer: Self-pay | Admitting: Neurology

## 2021-07-24 DIAGNOSIS — R42 Dizziness and giddiness: Secondary | ICD-10-CM

## 2021-08-04 ENCOUNTER — Ambulatory Visit
Admission: RE | Admit: 2021-08-04 | Discharge: 2021-08-04 | Disposition: A | Discharge status: Home / Self Care | Payer: PPO | Source: Ambulatory Visit | Attending: Neurology | Admitting: Neurology

## 2021-08-04 DIAGNOSIS — R42 Dizziness and giddiness: Secondary | ICD-10-CM | POA: Diagnosis not present

## 2021-08-04 DIAGNOSIS — G319 Degenerative disease of nervous system, unspecified: Secondary | ICD-10-CM | POA: Diagnosis not present

## 2021-08-04 DIAGNOSIS — R251 Tremor, unspecified: Secondary | ICD-10-CM | POA: Diagnosis not present

## 2021-09-01 DIAGNOSIS — R3915 Urgency of urination: Secondary | ICD-10-CM | POA: Diagnosis not present

## 2021-09-01 DIAGNOSIS — R2689 Other abnormalities of gait and mobility: Secondary | ICD-10-CM | POA: Diagnosis not present

## 2021-09-01 DIAGNOSIS — Z8616 Personal history of COVID-19: Secondary | ICD-10-CM | POA: Diagnosis not present

## 2021-09-01 DIAGNOSIS — F319 Bipolar disorder, unspecified: Secondary | ICD-10-CM | POA: Diagnosis not present

## 2021-09-01 DIAGNOSIS — G629 Polyneuropathy, unspecified: Secondary | ICD-10-CM | POA: Diagnosis not present

## 2021-09-16 DIAGNOSIS — F250 Schizoaffective disorder, bipolar type: Secondary | ICD-10-CM | POA: Diagnosis not present

## 2021-11-09 DIAGNOSIS — Z79899 Other long term (current) drug therapy: Secondary | ICD-10-CM | POA: Diagnosis not present

## 2021-11-09 DIAGNOSIS — L4050 Arthropathic psoriasis, unspecified: Secondary | ICD-10-CM | POA: Diagnosis not present

## 2021-11-09 DIAGNOSIS — L409 Psoriasis, unspecified: Secondary | ICD-10-CM | POA: Diagnosis not present

## 2021-11-09 DIAGNOSIS — Z111 Encounter for screening for respiratory tuberculosis: Secondary | ICD-10-CM | POA: Diagnosis not present

## 2021-11-09 DIAGNOSIS — N1832 Chronic kidney disease, stage 3b: Secondary | ICD-10-CM | POA: Diagnosis not present

## 2021-12-14 DIAGNOSIS — N1831 Chronic kidney disease, stage 3a: Secondary | ICD-10-CM | POA: Diagnosis not present

## 2021-12-14 DIAGNOSIS — N1832 Chronic kidney disease, stage 3b: Secondary | ICD-10-CM | POA: Diagnosis not present

## 2021-12-14 DIAGNOSIS — E781 Pure hyperglyceridemia: Secondary | ICD-10-CM | POA: Diagnosis not present

## 2021-12-14 DIAGNOSIS — D638 Anemia in other chronic diseases classified elsewhere: Secondary | ICD-10-CM | POA: Diagnosis not present

## 2021-12-21 DIAGNOSIS — E1159 Type 2 diabetes mellitus with other circulatory complications: Secondary | ICD-10-CM | POA: Diagnosis not present

## 2021-12-21 DIAGNOSIS — E781 Pure hyperglyceridemia: Secondary | ICD-10-CM | POA: Diagnosis not present

## 2021-12-21 DIAGNOSIS — Z89421 Acquired absence of other right toe(s): Secondary | ICD-10-CM | POA: Diagnosis not present

## 2021-12-21 DIAGNOSIS — N1831 Chronic kidney disease, stage 3a: Secondary | ICD-10-CM | POA: Diagnosis not present

## 2021-12-21 DIAGNOSIS — Z6832 Body mass index (BMI) 32.0-32.9, adult: Secondary | ICD-10-CM | POA: Diagnosis not present

## 2021-12-21 DIAGNOSIS — I10 Essential (primary) hypertension: Secondary | ICD-10-CM | POA: Diagnosis not present

## 2021-12-21 DIAGNOSIS — E6609 Other obesity due to excess calories: Secondary | ICD-10-CM | POA: Diagnosis not present

## 2021-12-21 DIAGNOSIS — F319 Bipolar disorder, unspecified: Secondary | ICD-10-CM | POA: Diagnosis not present

## 2021-12-24 DIAGNOSIS — E1159 Type 2 diabetes mellitus with other circulatory complications: Secondary | ICD-10-CM | POA: Diagnosis not present

## 2021-12-30 DIAGNOSIS — E1122 Type 2 diabetes mellitus with diabetic chronic kidney disease: Secondary | ICD-10-CM | POA: Diagnosis not present

## 2021-12-30 DIAGNOSIS — N183 Chronic kidney disease, stage 3 unspecified: Secondary | ICD-10-CM | POA: Diagnosis not present

## 2021-12-30 DIAGNOSIS — E039 Hypothyroidism, unspecified: Secondary | ICD-10-CM | POA: Diagnosis not present

## 2022-03-25 DIAGNOSIS — S4991XA Unspecified injury of right shoulder and upper arm, initial encounter: Secondary | ICD-10-CM | POA: Diagnosis not present

## 2022-03-25 DIAGNOSIS — S42201A Unspecified fracture of upper end of right humerus, initial encounter for closed fracture: Secondary | ICD-10-CM | POA: Diagnosis not present

## 2022-03-25 DIAGNOSIS — S42211A Unspecified displaced fracture of surgical neck of right humerus, initial encounter for closed fracture: Secondary | ICD-10-CM | POA: Diagnosis not present

## 2022-03-25 DIAGNOSIS — S42251A Displaced fracture of greater tuberosity of right humerus, initial encounter for closed fracture: Secondary | ICD-10-CM | POA: Diagnosis not present

## 2022-03-26 ENCOUNTER — Encounter: Payer: Self-pay | Admitting: Internal Medicine

## 2022-03-26 ENCOUNTER — Emergency Department: Payer: PPO

## 2022-03-26 ENCOUNTER — Inpatient Hospital Stay
Admission: EM | Admit: 2022-03-26 | Discharge: 2022-03-30 | DRG: 690 | Disposition: A | Discharge status: Skilled Nursing Facility | Payer: PPO | Attending: Family Medicine | Admitting: Family Medicine

## 2022-03-26 ENCOUNTER — Other Ambulatory Visit: Payer: Self-pay

## 2022-03-26 DIAGNOSIS — S42291D Other displaced fracture of upper end of right humerus, subsequent encounter for fracture with routine healing: Secondary | ICD-10-CM | POA: Diagnosis not present

## 2022-03-26 DIAGNOSIS — Z6832 Body mass index (BMI) 32.0-32.9, adult: Secondary | ICD-10-CM | POA: Diagnosis not present

## 2022-03-26 DIAGNOSIS — Z9181 History of falling: Secondary | ICD-10-CM | POA: Diagnosis not present

## 2022-03-26 DIAGNOSIS — Z823 Family history of stroke: Secondary | ICD-10-CM

## 2022-03-26 DIAGNOSIS — E6609 Other obesity due to excess calories: Secondary | ICD-10-CM | POA: Diagnosis not present

## 2022-03-26 DIAGNOSIS — Z85828 Personal history of other malignant neoplasm of skin: Secondary | ICD-10-CM

## 2022-03-26 DIAGNOSIS — E7800 Pure hypercholesterolemia, unspecified: Secondary | ICD-10-CM | POA: Diagnosis not present

## 2022-03-26 DIAGNOSIS — M1990 Unspecified osteoarthritis, unspecified site: Secondary | ICD-10-CM | POA: Diagnosis not present

## 2022-03-26 DIAGNOSIS — I129 Hypertensive chronic kidney disease with stage 1 through stage 4 chronic kidney disease, or unspecified chronic kidney disease: Secondary | ICD-10-CM | POA: Diagnosis present

## 2022-03-26 DIAGNOSIS — Z79890 Hormone replacement therapy (postmenopausal): Secondary | ICD-10-CM

## 2022-03-26 DIAGNOSIS — Z880 Allergy status to penicillin: Secondary | ICD-10-CM | POA: Diagnosis not present

## 2022-03-26 DIAGNOSIS — E039 Hypothyroidism, unspecified: Secondary | ICD-10-CM | POA: Diagnosis not present

## 2022-03-26 DIAGNOSIS — M6281 Muscle weakness (generalized): Secondary | ICD-10-CM | POA: Diagnosis not present

## 2022-03-26 DIAGNOSIS — Z743 Need for continuous supervision: Secondary | ICD-10-CM | POA: Diagnosis not present

## 2022-03-26 DIAGNOSIS — E860 Dehydration: Secondary | ICD-10-CM | POA: Diagnosis not present

## 2022-03-26 DIAGNOSIS — Z888 Allergy status to other drugs, medicaments and biological substances status: Secondary | ICD-10-CM

## 2022-03-26 DIAGNOSIS — R41841 Cognitive communication deficit: Secondary | ICD-10-CM | POA: Diagnosis not present

## 2022-03-26 DIAGNOSIS — N1832 Chronic kidney disease, stage 3b: Secondary | ICD-10-CM | POA: Diagnosis present

## 2022-03-26 DIAGNOSIS — N183 Chronic kidney disease, stage 3 unspecified: Secondary | ICD-10-CM | POA: Diagnosis present

## 2022-03-26 DIAGNOSIS — Z6833 Body mass index (BMI) 33.0-33.9, adult: Secondary | ICD-10-CM | POA: Diagnosis not present

## 2022-03-26 DIAGNOSIS — E1122 Type 2 diabetes mellitus with diabetic chronic kidney disease: Secondary | ICD-10-CM | POA: Diagnosis not present

## 2022-03-26 DIAGNOSIS — R5381 Other malaise: Secondary | ICD-10-CM | POA: Diagnosis not present

## 2022-03-26 DIAGNOSIS — W19XXXA Unspecified fall, initial encounter: Secondary | ICD-10-CM | POA: Diagnosis not present

## 2022-03-26 DIAGNOSIS — N2581 Secondary hyperparathyroidism of renal origin: Secondary | ICD-10-CM | POA: Diagnosis not present

## 2022-03-26 DIAGNOSIS — Z8249 Family history of ischemic heart disease and other diseases of the circulatory system: Secondary | ICD-10-CM | POA: Diagnosis not present

## 2022-03-26 DIAGNOSIS — L4050 Arthropathic psoriasis, unspecified: Secondary | ICD-10-CM | POA: Diagnosis present

## 2022-03-26 DIAGNOSIS — E781 Pure hyperglyceridemia: Secondary | ICD-10-CM | POA: Diagnosis not present

## 2022-03-26 DIAGNOSIS — Z79899 Other long term (current) drug therapy: Secondary | ICD-10-CM

## 2022-03-26 DIAGNOSIS — I10 Essential (primary) hypertension: Secondary | ICD-10-CM | POA: Diagnosis present

## 2022-03-26 DIAGNOSIS — Z8616 Personal history of COVID-19: Secondary | ICD-10-CM

## 2022-03-26 DIAGNOSIS — R296 Repeated falls: Secondary | ICD-10-CM | POA: Diagnosis not present

## 2022-03-26 DIAGNOSIS — S42211A Unspecified displaced fracture of surgical neck of right humerus, initial encounter for closed fracture: Secondary | ICD-10-CM | POA: Diagnosis not present

## 2022-03-26 DIAGNOSIS — I959 Hypotension, unspecified: Secondary | ICD-10-CM | POA: Diagnosis present

## 2022-03-26 DIAGNOSIS — S42291A Other displaced fracture of upper end of right humerus, initial encounter for closed fracture: Secondary | ICD-10-CM

## 2022-03-26 DIAGNOSIS — F319 Bipolar disorder, unspecified: Secondary | ICD-10-CM | POA: Diagnosis present

## 2022-03-26 DIAGNOSIS — N390 Urinary tract infection, site not specified: Secondary | ICD-10-CM | POA: Diagnosis not present

## 2022-03-26 DIAGNOSIS — D631 Anemia in chronic kidney disease: Secondary | ICD-10-CM | POA: Diagnosis not present

## 2022-03-26 DIAGNOSIS — R531 Weakness: Secondary | ICD-10-CM | POA: Diagnosis not present

## 2022-03-26 DIAGNOSIS — S42201A Unspecified fracture of upper end of right humerus, initial encounter for closed fracture: Secondary | ICD-10-CM | POA: Diagnosis not present

## 2022-03-26 DIAGNOSIS — Z8349 Family history of other endocrine, nutritional and metabolic diseases: Secondary | ICD-10-CM

## 2022-03-26 LAB — COMPREHENSIVE METABOLIC PANEL
ALT: 10 U/L (ref 0–44)
AST: 17 U/L (ref 15–41)
Albumin: 2.6 g/dL — ABNORMAL LOW (ref 3.5–5.0)
Alkaline Phosphatase: 53 U/L (ref 38–126)
Anion gap: 5 (ref 5–15)
BUN: 21 mg/dL — ABNORMAL HIGH (ref 6–20)
CO2: 29 mmol/L (ref 22–32)
Calcium: 9.1 mg/dL (ref 8.9–10.3)
Chloride: 101 mmol/L (ref 98–111)
Creatinine, Ser: 0.96 mg/dL (ref 0.44–1.00)
GFR, Estimated: >60 mL/min (ref >60)
Glucose, Bld: 171 mg/dL — ABNORMAL HIGH (ref 70–99)
Potassium: 3.4 mmol/L — ABNORMAL LOW (ref 3.5–5.1)
Sodium: 135 mmol/L (ref 135–145)
Total Bilirubin: 0.7 mg/dL (ref 0.3–1.2)
Total Protein: 6.4 g/dL — ABNORMAL LOW (ref 6.5–8.1)

## 2022-03-26 LAB — CBC WITH DIFFERENTIAL/PLATELET
Abs Immature Granulocytes: 0.06 10*3/uL (ref 0.00–0.07)
Basophils Absolute: 0 10*3/uL (ref 0.0–0.1)
Basophils Relative: 0 %
Eosinophils Absolute: 0.1 10*3/uL (ref 0.0–0.5)
Eosinophils Relative: 1 %
HCT: 31.5 % — ABNORMAL LOW (ref 36.0–46.0)
Hemoglobin: 10.1 g/dL — ABNORMAL LOW (ref 12.0–15.0)
Immature Granulocytes: 1 %
Lymphocytes Relative: 15 %
Lymphs Abs: 1.3 10*3/uL (ref 0.7–4.0)
MCH: 30.1 pg (ref 26.0–34.0)
MCHC: 32.1 g/dL (ref 30.0–36.0)
MCV: 93.8 fL (ref 80.0–100.0)
Monocytes Absolute: 1 10*3/uL (ref 0.1–1.0)
Monocytes Relative: 11 %
Neutro Abs: 6.1 10*3/uL (ref 1.7–7.7)
Neutrophils Relative %: 72 %
Platelets: 215 10*3/uL (ref 150–400)
RBC: 3.36 MIL/uL — ABNORMAL LOW (ref 3.87–5.11)
RDW: 13.6 % (ref 11.5–15.5)
WBC: 8.5 10*3/uL (ref 4.0–10.5)
nRBC: 0 % (ref 0.0–0.2)

## 2022-03-26 LAB — URINALYSIS, ROUTINE W REFLEX MICROSCOPIC
Bilirubin Urine: NEGATIVE
Glucose, UA: 50 mg/dL — AB
Ketones, ur: NEGATIVE mg/dL
Nitrite: POSITIVE — AB
Protein, ur: NEGATIVE mg/dL
Specific Gravity, Urine: 1.014 (ref 1.005–1.030)
pH: 5 (ref 5.0–8.0)

## 2022-03-26 LAB — TROPONIN I (HIGH SENSITIVITY)
Troponin I (High Sensitivity): 4 ng/L (ref <18)
Troponin I (High Sensitivity): 3 ng/L (ref <18)

## 2022-03-26 LAB — HIV ANTIBODY (ROUTINE TESTING W REFLEX): HIV Screen 4th Generation wRfx: NONREACTIVE

## 2022-03-26 LAB — MAGNESIUM: Magnesium: 1.5 mg/dL — ABNORMAL LOW (ref 1.7–2.4)

## 2022-03-26 LAB — GLUCOSE, CAPILLARY: Glucose-Capillary: 96 mg/dL (ref 70–99)

## 2022-03-26 LAB — LACTIC ACID, PLASMA
Lactic Acid, Venous: 1.5 mmol/L (ref 0.5–1.9)
Lactic Acid, Venous: 0.7 mmol/L (ref 0.5–1.9)

## 2022-03-26 LAB — CBG MONITORING, ED: Glucose-Capillary: 180 mg/dL — ABNORMAL HIGH (ref 70–99)

## 2022-03-26 MED ORDER — BENZTROPINE MESYLATE 0.5 MG PO TABS
0.5000 mg | ORAL_TABLET | Freq: Two times a day (BID) | ORAL | Status: DC
Start: 2022-03-26 — End: 2022-03-30
  Administered 2022-03-26 – 2022-03-30 (×8): 0.5 mg via ORAL
  Filled 2022-03-26 (×9): qty 1

## 2022-03-26 MED ORDER — ACETAMINOPHEN 325 MG PO TABS: 650.0000 mg | ORAL_TABLET | Freq: Four times a day (QID) | ORAL | Status: DC | PRN

## 2022-03-26 MED ORDER — CLONAZEPAM 1 MG PO TABS
1.0000 mg | ORAL_TABLET | Freq: Every evening | ORAL | Status: DC | PRN
Start: 2022-03-26 — End: 2022-03-30
  Administered 2022-03-28: 1 mg via ORAL
  Filled 2022-03-26: qty 1

## 2022-03-26 MED ORDER — CEFTRIAXONE SODIUM 1 G IJ SOLR
1.0000 g | INTRAMUSCULAR | Status: DC
Start: 2022-03-27 — End: 2022-03-29
  Administered 2022-03-27 – 2022-03-28 (×2): 1 g via INTRAVENOUS
  Filled 2022-03-26: qty 10
  Filled 2022-03-26: qty 1
  Filled 2022-03-26: qty 10

## 2022-03-26 MED ORDER — DIVALPROEX SODIUM 500 MG PO DR TAB
500.0000 mg | DELAYED_RELEASE_TABLET | Freq: Two times a day (BID) | ORAL | Status: DC
  Administered 2022-03-26 – 2022-03-30 (×8): 500 mg via ORAL
  Filled 2022-03-26 (×8): qty 1

## 2022-03-26 MED ORDER — POTASSIUM CHLORIDE 20 MEQ PO PACK
40.0000 meq | PACK | Freq: Once | ORAL | Status: AC
  Administered 2022-03-26: 40 meq via ORAL
  Filled 2022-03-26: qty 2

## 2022-03-26 MED ORDER — SODIUM CHLORIDE 0.9 % IV BOLUS
500.0000 mL | Freq: Once | INTRAVENOUS | Status: AC
  Administered 2022-03-26: 500 mL via INTRAVENOUS

## 2022-03-26 MED ORDER — INSULIN ASPART 100 UNIT/ML IJ SOLN
0.0000 [IU] | Freq: Three times a day (TID) | INTRAMUSCULAR | Status: DC
  Administered 2022-03-26 – 2022-03-27 (×3): 3 [IU] via SUBCUTANEOUS
  Administered 2022-03-28: 2 [IU] via SUBCUTANEOUS
  Administered 2022-03-28: 5 [IU] via SUBCUTANEOUS
  Administered 2022-03-28: 2 [IU] via SUBCUTANEOUS
  Administered 2022-03-29: 3 [IU] via SUBCUTANEOUS
  Administered 2022-03-29 – 2022-03-30 (×3): 2 [IU] via SUBCUTANEOUS
  Administered 2022-03-30: 3 [IU] via SUBCUTANEOUS
  Filled 2022-03-26 (×10): qty 1

## 2022-03-26 MED ORDER — ACETAMINOPHEN 650 MG RE SUPP: 650.0000 mg | Freq: Four times a day (QID) | RECTAL | Status: DC | PRN

## 2022-03-26 MED ORDER — POLYETHYLENE GLYCOL 3350 17 G PO PACK
17.0000 g | PACK | Freq: Every day | ORAL | Status: DC | PRN
  Administered 2022-03-30: 17 g via ORAL
  Filled 2022-03-26: qty 1

## 2022-03-26 MED ORDER — LEVOTHYROXINE SODIUM 100 MCG PO TABS
100.0000 ug | ORAL_TABLET | Freq: Every day | ORAL | Status: DC
  Administered 2022-03-27 – 2022-03-30 (×4): 100 ug via ORAL
  Filled 2022-03-26 (×4): qty 1

## 2022-03-26 MED ORDER — MELATONIN 5 MG PO TABS
10.0000 mg | ORAL_TABLET | Freq: Every day | ORAL | Status: DC
  Administered 2022-03-26 – 2022-03-29 (×4): 10 mg via ORAL
  Filled 2022-03-26 (×4): qty 2

## 2022-03-26 MED ORDER — SODIUM CHLORIDE 0.9% FLUSH
3.0000 mL | Freq: Two times a day (BID) | INTRAVENOUS | Status: DC
Start: 2022-03-26 — End: 2022-03-30
  Administered 2022-03-26 – 2022-03-30 (×8): 3 mL via INTRAVENOUS

## 2022-03-26 MED ORDER — QUETIAPINE FUMARATE 100 MG PO TABS
150.0000 mg | ORAL_TABLET | Freq: Every day | ORAL | Status: DC
  Administered 2022-03-26 – 2022-03-29 (×4): 150 mg via ORAL
  Filled 2022-03-26 (×5): qty 1.5

## 2022-03-26 MED ORDER — SODIUM CHLORIDE 0.9 % IV SOLN
1.0000 g | Freq: Once | INTRAVENOUS | Status: AC
  Administered 2022-03-26: 1 g via INTRAVENOUS
  Filled 2022-03-26: qty 10

## 2022-03-26 NOTE — ED Provider Notes (Signed)
[facility] Provider Note    Event Date/Time   First MD Initiated Contact with Patient 03/26/22 1302     (approximate)   History   Fall (C/o fall unwitnessed no LOC, pt here yesterday also for fall, fractured her right humerus yesterday. Pt A&O found to by hypotensive 80/42 pt got 527m NS.)   HPI  Stacey Ball a 61y.o. female with history of bipolar disorder, hypertension, diabetes, chronic kidney disease, and hypothyroidism who presents with generalized weakness and lethargy over the last several days.  The husband states that the patient has had increased weakness and difficulty getting around for a while, but has acutely worsened.  The patient felt weak and fell yesterday, breaking her right humerus.  She was seen as an outpatient had a sling placed.  Today the patient once again felt very weak and fell a second time onto the right arm.  She denies hitting her head and did not pass out.  She denies any neck or back pain.  She reports some mild urinary frequency but no dysuria.  She denies chest pain or difficulty breathing.  She has no cough or fever.  She denies any recent changes in her medications     Physical Exam   Triage Vital Signs: ED Triage Vitals  Enc Vitals Group     BP 03/26/22 1306 123/61     Pulse Rate 03/26/22 1306 88     Resp 03/26/22 1306 18     Temp 03/26/22 1306 97.6 F (36.4 C)     Temp Source 03/26/22 1306 Oral     SpO2 03/26/22 1306 99 %     Weight --      Height --      Head Circumference --      Peak Flow --      Pain Score 03/26/22 1316 0     Pain Loc --      Pain Edu? --      Excl. in [place] --     Most recent vital signs: Vitals:   03/26/22 1306 03/26/22 1500  BP: 123/61 136/70  Pulse: 88 91  Resp: 18 10  Temp: 97.6 F (36.4 C)   SpO2: 99% 99%     General: Weak and tired appearing but alert and oriented. CV:  Good peripheral perfusion.  Normal heart sounds. Resp:  Normal effort.  Lungs  CTAB. Abd:  Soft and nontender.  No distention.  Other:  No peripheral edema.  Full range of motion of bilateral hips and knees.  No midline spinal tenderness.  Full range of motion of the neck.  EOMI.  PERRLA.  Dry mucous membranes.  Right upper extremity with 2+ radial pulse, motor and sensory intact in median, ulnar, and radial distributions.   ED Results / Procedures / Treatments   Labs (all labs ordered are listed, but only abnormal results are displayed) Labs Reviewed  COMPREHENSIVE METABOLIC PANEL - Abnormal; Notable for the following components:      Result Value   Potassium 3.4 (*)    Glucose, Bld 171 (*)    BUN 21 (*)    Total Protein 6.4 (*)    Albumin 2.6 (*)    All other components within normal limits  CBC WITH DIFFERENTIAL/PLATELET - Abnormal; Notable for the following components:   RBC 3.36 (*)    Hemoglobin 10.1 (*)    HCT 31.5 (*)    All other components within normal limits  URINALYSIS, ROUTINE  W REFLEX MICROSCOPIC - Abnormal; Notable for the following components:   Color, Urine YELLOW (*)    APPearance HAZY (*)    Glucose, UA 50 (*)    Hgb urine dipstick SMALL (*)    Nitrite POSITIVE (*)    Leukocytes,Ua TRACE (*)    Bacteria, UA FEW (*)    All other components within normal limits  LACTIC ACID, PLASMA  LACTIC ACID, PLASMA  HIV ANTIBODY (ROUTINE TESTING W REFLEX)  MAGNESIUM  TROPONIN I (HIGH SENSITIVITY)  TROPONIN I (HIGH SENSITIVITY)     EKG  ED ECG REPORT I, Arta Silence, the attending physician, personally viewed and interpreted this ECG.  Date: 03/26/2022 EKG Time: 1457 Rate: 89  Rhythm: normal sinus rhythm QRS Axis: Borderline left axis Intervals: Prolonged PR ST/T Wave abnormalities: normal Narrative Interpretation: no evidence of acute ischemia    RADIOLOGY  XR R shoulder: I independently viewed and interpreted the images; there is a comminuted, displaced right humerus surgical neck fracture  PROCEDURES:  Critical Care  performed: No  Procedures   MEDICATIONS ORDERED IN ED: Medications  QUEtiapine (SEROQUEL) tablet 150 mg (has no administration in time range)  levothyroxine (SYNTHROID) tablet 100 mcg (has no administration in time range)  Melatonin SUBL (has no administration in time range)  divalproex (DEPAKOTE) DR tablet 500 mg (has no administration in time range)  benztropine (COGENTIN) tablet 0.5 mg (has no administration in time range)  clonazePAM (KLONOPIN) tablet 1 mg (has no administration in time range)  sodium chloride flush (NS) 0.9 % injection 3 mL (has no administration in time range)  potassium chloride (KLOR-CON) packet 40 mEq (has no administration in time range)  acetaminophen (TYLENOL) tablet 650 mg (has no administration in time range)    Or  acetaminophen (TYLENOL) suppository 650 mg (has no administration in time range)  polyethylene glycol (MIRALAX / GLYCOLAX) packet 17 g (has no administration in time range)  sodium chloride 0.9 % bolus 500 mL (0 mLs Intravenous Stopped 03/26/22 1507)  cefTRIAXone (ROCEPHIN) 1 g in sodium chloride 0.9 % 100 mL IVPB (1 g Intravenous New Bag/Given 03/26/22 1515)     IMPRESSION / MDM / ASSESSMENT AND PLAN / ED COURSE  I reviewed the triage vital signs and the nursing notes.  61 year old female with PMH as noted above presents with generalized weakness, lethargy, and multiple falls causing injury of her right shoulder yesterday.  I reviewed the past medical records.  Per the hospitalist discharge summary from 10/24/2020 the patient was admitted at that time for COVID-pneumonia.  She has had no ED visits or admissions since that time.  The patient was seen in the outpatient clinic yesterday by Dr. Sherilyn Cooter and diagnosed with a proximal humerus fracture and placed in a sling.  Differential diagnosis includes, but is not limited to, medication side effect, dehydration, electrolyte abnormality, AKI, other metabolic disturbance, UTI or other infection, or less  likely cardiac etiology.  Patient's presentation is most consistent with acute presentation with potential threat to life or bodily function.  We will obtain repeat x-ray of the right shoulder to evaluate for any worsening of the fracture, lab work-up, give fluids, and reassess.  I anticipate the patient will either need admission or social work evaluation for placement, as per her and the husband she is not safe to go home.  The patient is on the cardiac monitor to evaluate for evidence of arrhythmia and/or significant heart rate changes.  ----------------------------------------- 3:47 PM on 03/26/2022 -----------------------------------------  Urinalysis is slightly  equivocal but is nitrite positive suggestive of possible UTI.  I have ordered ceftriaxone.  Lactate is normal.  There is no leukocytosis.  Electrolytes are unremarkable.  X-ray shows comminuted displaced humeral fracture.  I have paged orthopedics to look at the images and give [place] recommendations.  I consulted Dr. Trilby Drummer from the hospitalist service; based on our discussion he agrees to admit the patient.  FINAL CLINICAL IMPRESSION(S) / ED DIAGNOSES   Final diagnoses:  Generalized weakness  Urinary tract infection without hematuria, site unspecified  Closed fracture of head of right humerus, initial encounter     Rx / DC Orders   ED Discharge Orders     None        Note:  This document was prepared using Dragon voice recognition software and may include unintentional dictation errors.    Arta Silence, MD 03/26/22 [phone]

## 2022-03-26 NOTE — Plan of Care (Signed)
  Problem: Education: Goal: Ability to describe self-care measures that may prevent or decrease complications (Diabetes Survival Skills Education) will improve Outcome: Progressing   Problem: Coping: Goal: Ability to adjust to condition or change in health will improve Outcome: Progressing   Problem: Fluid Volume: Goal: Ability to maintain a balanced intake and output will improve Outcome: Progressing   Problem: Nutritional: Goal: Maintenance of adequate nutrition will improve Outcome: Progressing   Problem: Skin Integrity: Goal: Risk for impaired skin integrity will decrease Outcome: Progressing   Problem: Tissue Perfusion: Goal: Adequacy of tissue perfusion will improve Outcome: Progressing   Problem: Education: Goal: Knowledge of General Education information will improve Description: Including pain rating scale, medication(s)/side effects and non-pharmacologic comfort measures Outcome: Progressing

## 2022-03-26 NOTE — H&P (Signed)
History and Physical   Stacey Ball:[id number] DOB: 04-07-1961 DOA: 03/26/2022  PCP: Dion Body, MD   Patient coming from: Home  Chief Complaint: Weakness, falls  HPI: Stacey Ball is a 61 y.o. female with medical history significant of bipolar, anemia, cataracts, hyperlipidemia, hypertension, hypothyroidism, obesity, diabetes, CKD 3B presenting with continued weakness and recurrent falls.  Patient has had some general weakness ongoing somewhat chronically but has had significant worsening in the last 3 days.  She had a fall yesterday and was evaluated outpatient and found to have a humerus fracture and was placed in a sling.  She has had continuing weakness and has had additional fall today where she had to be assisted to the ground. Denies any loss of consciousness or head trauma with her falls.  She denies fevers, chills, chest pain, shortness of breath, abdominal pain, constipation or diarrhea, nausea, vomiting.  ED Course: Vital signs in the ED stable.  Lab work-up included CMP with potassium 3.4, BUN 21, glucose 171, protein 6.4, albumin 2.6.  CBC showed hemoglobin 7 below waistline at 10.1 with baseline of 12.  Troponin negative in the ED with repeat pending.  Lactic acid normal.  Urinalysis showing hemoglobin, nitrates, leukocytes, bacteria.  Right shoulder x-ray showing femoral neck fracture with possible additional humeral head fracture.  EDP is consulted to orthopedics to see if there is additional or more urgent evaluation needed of the fracture or fractures.  Patient husband and patient are uncomfortable with going home and given her acute change last few days likely benefit from observation overnight and further evaluation with PT and OT.  Review of Systems: As per HPI otherwise all other systems reviewed and are negative.  Past Medical History:  Diagnosis Date   Anemia    HX of   Arthritis    Bipolar 1 disorder ([place])    Cancer (HCC)    skin cancer    Cataracts, bilateral    Diabetes mellitus without complication (HCC)    Dyspnea    on exertion (Pt states, "out of shape")   Function kidney decreased    High cholesterol    Hypertension    Hypothyroidism    Psoriasis    Psoriatic arthritis ([place])    Thyroid disease     Past Surgical History:  Procedure Laterality Date   CATARACT EXTRACTION W/PHACO Left 01/10/2020   Procedure: CATARACT EXTRACTION PHACO AND INTRAOCULAR LENS PLACEMENT ([place]) LEFT DIABETIC;  Surgeon: Marchia Meiers, MD;  Location: [place];  Service: Ophthalmology;  Laterality: Left;  5.32 0:41.5   CATARACT EXTRACTION W/PHACO Right 02/07/2020   Procedure: CATARACT EXTRACTION PHACO AND INTRAOCULAR LENS PLACEMENT ([place]) RIGHT DIABETIC VISION BLUE 4.01 00:27.4;  Surgeon: Marchia Meiers, MD;  Location: [place];  Service: Ophthalmology;  Laterality: Right;  Diabetic - oral meds   COLONOSCOPY WITH PROPOFOL N/A 03/04/2016   Procedure: COLONOSCOPY WITH PROPOFOL;  Surgeon: Manya Silvas, MD;  Location: [facility] ENDOSCOPY;  Service: Endoscopy;  Laterality: N/A;   great toe amputation     matrixectomy     TONSILLECTOMY      Social History  reports that she is a non-smoker but has been exposed to tobacco smoke. She has never used smokeless tobacco. She reports that she does not drink alcohol and does not use drugs.  Allergies  Allergen Reactions   Iodine Solution [Povidone Iodine] Other (See Comments)    States it was used on toes and face broke out   Penicillins Rash   Shellfish  Allergy Rash    Family History  Problem Relation Age of Onset   Thyroid disease Mother    Cancer Mother    Stroke Father    Heart failure Father   Reviewed on admission  Prior to Admission medications   Medication Sig Start Date End Date Taking? Authorizing Provider  Cholecalciferol (VITAMIN D3) 125 MCG (5000 UT) CHEW Chew by mouth. dinner    [provider]  diclofenac sodium (VOLTAREN) 1 % GEL Apply topically 4  (four) times daily.     [provider]  divalproex (DEPAKOTE) 250 MG DR tablet Take 500 mg by mouth 2 (two) times daily. breakfast and bedtime    [provider]  famotidine (PEPCID) 20 MG tablet Take 1 tablet (20 mg total) by mouth daily. 10/24/20 10/24/21  Nolberto Hanlon, MD  fluocinonide ointment (LIDEX) 8.50 % Apply 1 application topically 2 (two) times daily.    [provider]  levothyroxine (SYNTHROID) 100 MCG tablet Take 100 mcg by mouth daily before breakfast.    [provider]  Melatonin 10 MG SUBL Place under the tongue at bedtime.    [provider]  pioglitazone (ACTOS) 15 MG tablet Take 15 mg by mouth daily.    [provider]  QUEtiapine (SEROQUEL) 300 MG tablet Take 0.5 tablets (150 mg total) by mouth at bedtime. 10/24/20   Nolberto Hanlon, MD    Physical Exam: Vitals:   03/26/22 1306 03/26/22 1500  BP: 123/61 136/70  Pulse: 88 91  Resp: 18 10  Temp: 97.6 F (36.4 C)   TempSrc: Oral   SpO2: 99% 99%    Physical Exam Constitutional:      General: She is not in acute distress.    Appearance: Normal appearance. She is obese.     Comments: Tired appearing  HENT:     Head: Normocephalic and atraumatic.     Mouth/Throat:     Mouth: Mucous membranes are moist.     Pharynx: Oropharynx is clear.  Eyes:     Extraocular Movements: Extraocular movements intact.     Pupils: Pupils are equal, round, and reactive to light.  Cardiovascular:     Rate and Rhythm: Normal rate and regular rhythm.     Pulses: Normal pulses.     Heart sounds: Normal heart sounds.  Pulmonary:     Effort: Pulmonary effort is normal. No respiratory distress.     Breath sounds: Normal breath sounds.  Abdominal:     General: Bowel sounds are normal. There is no distension.     Palpations: Abdomen is soft.     Tenderness: There is no abdominal tenderness.  Musculoskeletal:        General: No swelling or deformity.  Skin:    General: Skin is warm and  dry.  Neurological:     General: No focal deficit present.     Mental Status: Mental status is at baseline.    Labs on Admission: I have personally reviewed following labs and imaging studies  CBC: Recent Labs  Lab 03/26/22 1353  WBC 8.5  NEUTROABS 6.1  HGB 10.1*  HCT 31.5*  MCV 93.8  PLT 277    Basic Metabolic Panel: Recent Labs  Lab 03/26/22 1353  NA 135  K 3.4*  CL 101  CO2 29  GLUCOSE 171*  BUN 21*  CREATININE 0.96  CALCIUM 9.1    GFR: CrCl cannot be calculated (Unknown ideal weight.).  Liver Function Tests: Recent Labs  Lab 03/26/22 1353  AST 17  ALT 10  ALKPHOS 53  BILITOT 0.7  PROT 6.4*  ALBUMIN 2.6*    Urine analysis:    Component Value Date/Time   COLORURINE YELLOW (A) 03/26/2022 1353   APPEARANCEUR HAZY (A) 03/26/2022 1353   LABSPEC 1.014 03/26/2022 1353   PHURINE 5.0 03/26/2022 1353   GLUCOSEU 50 (A) 03/26/2022 1353   HGBUR SMALL (A) 03/26/2022 1353   BILIRUBINUR NEGATIVE 03/26/2022 1353   KETONESUR NEGATIVE 03/26/2022 1353   PROTEINUR NEGATIVE 03/26/2022 1353   NITRITE POSITIVE (A) 03/26/2022 1353   LEUKOCYTESUR TRACE (A) 03/26/2022 1353    Radiological Exams on Admission: DG Shoulder Right  Result Date: 03/26/2022 CLINICAL DATA:  Humerus fracture yesterday. [place] again. Known proximal humerus fracture. EXAM: RIGHT SHOULDER - 2+ VIEW COMPARISON:  Reportedly right shoulder radiographs were performed 03/25/2022 (yesterday) however are unavailable. FINDINGS: There is a markedly comminuted and moderately displaced fracture of the right humeral surgical neck. On frontal view there appears to be approximately 1.5 cm fracture line craniocaudal diastasis. There is a cortical fragment measuring up to 2 cm that appears to be displaced medially 8 mm. There is minimal varus angulation of the fracture. On transscapular Y-view there appears to be anterior angulation of the fracture and anterior displacement of the distal fracture component with respect  to the proximal fracture component. There appears to be volume loss from a cortical fracture of the superolateral humeral head greater tuberosity. The humeral head remains appropriately located with respect of the glenoid. Mild acromioclavicular joint space narrowing and peripheral osteophytosis. IMPRESSION: 1. Markedly comminuted and moderately displaced fracture of the right humeral surgical neck. 2. Likely additional comminuted greater tuberosity of the humeral head fracture. Electronically Signed   By: Yvonne Kendall M.D.   On: 03/26/2022 15:05    EKG: Independently reviewed.  Sinus rhythm at 89 bpm.  Prolonged PR interval at 213.  Nonspecific T wave flattening.  Assessment/Plan Principal Problem:   UTI (urinary tract infection) Active Problems:   Bipolar disorder (HCC)   Class 1 obesity due to excess calories with serious comorbidity and body mass index (BMI) of 32.0 to 32.9 in adult   Type 2 diabetes mellitus with stage 3 chronic kidney disease, without long-term current use of insulin ([place])   Essential hypertension   Hypothyroidism   Falls UTI > Patient with some chronic weakness now with more rapidly worsening weakness for the last few days with associated falls. > As below suffered humerus fracture yesterday and was evaluated for this outpatient and placed in a sling.  However she did fall again today, was helped to the ground. > Noted to have evidence of UTI on urinalysis with nitrates, leukocytes, bacteria.  No current leukocytosis.  However with systemic symptoms of worsening weakness may benefit from observation overnight. - Monitor on MedSurg with continuous pulse ox - Continue with IV antibiotics overnight - PT and OT eval and treat - Trend fever curve and WBC  Humerus fracture > As above recent fracture from fall in the setting of above UTI and weakness.  Seen outpatient after initial fracture and sling placed as well as referral.  Has fallen again and was helped to the  ground. > Ortho consulted in the ED for reevaluation of imaging if any additional or more urgent intervention needed. - Appreciate orthopedics recommendations - Pain control if needed - Hold off on anticoagulation for now  Bipolar - Continue home Seroquel, Depakote, benztropine, as needed clonazepam  Anemia > Hemoglobin somewhat below baseline at 10.1  and baseline of 12 - Continue to trend CBC  Hypothyroidism - Continue home Synthroid  Diabetes - SSI  CKD 3 > Renal function appears stable in the ED. - Continue to monitor  Obesity - Noted  DVT prophylaxis: SCDs for now Code Status:   Full Family Communication:  Updated at bedside  Disposition Plan:   Patient is from:  Home  Anticipated DC to:  Pending clinical course and PT evaluation  Anticipated DC date:  Pending clinical course  Anticipated DC barriers: Pending PT evaluation  Consults called:  Orthopedics, consulted by EDP, awaiting hear back.   Admission status:  Observation, MedSurg  Severity of Illness: The appropriate patient status for this patient is OBSERVATION. Observation status is judged to be reasonable and necessary in order to provide the required intensity of service to ensure the patient's safety. The patient's presenting symptoms, physical exam findings, and initial radiographic and laboratory data in the context of their medical condition is felt to place them at decreased risk for further clinical deterioration. Furthermore, it is anticipated that the patient will be medically stable for discharge from the hospital within 2 midnights of admission.    Marcelyn Bruins MD [facility]  How to contact the [facility] Attending or Consulting provider [place] or covering provider during after hours [place], for this patient?   Check the care team in [facility] and look for a) attending/consulting TR[facility] provider listed and b) the [facility] team listed Log into [email or web address] and use [place]'s universal password to  access. If you do not have the password, please contact the hospital operator. Locate the [facility] provider you are looking for under [facility] and page to a number that you can be directly reached. If you still have difficulty reaching the provider, please page the [facility] (Director on Call) for the Hospitalists listed on amion for assistance.  03/26/2022, 4:08 PM

## 2022-03-27 DIAGNOSIS — Z85828 Personal history of other malignant neoplasm of skin: Secondary | ICD-10-CM | POA: Diagnosis not present

## 2022-03-27 DIAGNOSIS — Z8249 Family history of ischemic heart disease and other diseases of the circulatory system: Secondary | ICD-10-CM | POA: Diagnosis not present

## 2022-03-27 DIAGNOSIS — Z888 Allergy status to other drugs, medicaments and biological substances status: Secondary | ICD-10-CM | POA: Diagnosis not present

## 2022-03-27 DIAGNOSIS — R296 Repeated falls: Secondary | ICD-10-CM | POA: Diagnosis present

## 2022-03-27 DIAGNOSIS — Z823 Family history of stroke: Secondary | ICD-10-CM | POA: Diagnosis not present

## 2022-03-27 DIAGNOSIS — E039 Hypothyroidism, unspecified: Secondary | ICD-10-CM | POA: Diagnosis present

## 2022-03-27 DIAGNOSIS — E1122 Type 2 diabetes mellitus with diabetic chronic kidney disease: Secondary | ICD-10-CM | POA: Diagnosis present

## 2022-03-27 DIAGNOSIS — E860 Dehydration: Secondary | ICD-10-CM | POA: Diagnosis present

## 2022-03-27 DIAGNOSIS — M1990 Unspecified osteoarthritis, unspecified site: Secondary | ICD-10-CM | POA: Diagnosis present

## 2022-03-27 DIAGNOSIS — E7800 Pure hypercholesterolemia, unspecified: Secondary | ICD-10-CM | POA: Diagnosis present

## 2022-03-27 DIAGNOSIS — R531 Weakness: Secondary | ICD-10-CM | POA: Diagnosis present

## 2022-03-27 DIAGNOSIS — S42211A Unspecified displaced fracture of surgical neck of right humerus, initial encounter for closed fracture: Secondary | ICD-10-CM | POA: Diagnosis present

## 2022-03-27 DIAGNOSIS — Z6833 Body mass index (BMI) 33.0-33.9, adult: Secondary | ICD-10-CM | POA: Diagnosis not present

## 2022-03-27 DIAGNOSIS — N1832 Chronic kidney disease, stage 3b: Secondary | ICD-10-CM | POA: Diagnosis present

## 2022-03-27 DIAGNOSIS — N390 Urinary tract infection, site not specified: Secondary | ICD-10-CM | POA: Diagnosis present

## 2022-03-27 DIAGNOSIS — Z8349 Family history of other endocrine, nutritional and metabolic diseases: Secondary | ICD-10-CM | POA: Diagnosis not present

## 2022-03-27 DIAGNOSIS — D631 Anemia in chronic kidney disease: Secondary | ICD-10-CM | POA: Diagnosis present

## 2022-03-27 DIAGNOSIS — I959 Hypotension, unspecified: Secondary | ICD-10-CM | POA: Diagnosis present

## 2022-03-27 DIAGNOSIS — E6609 Other obesity due to excess calories: Secondary | ICD-10-CM | POA: Diagnosis present

## 2022-03-27 DIAGNOSIS — I129 Hypertensive chronic kidney disease with stage 1 through stage 4 chronic kidney disease, or unspecified chronic kidney disease: Secondary | ICD-10-CM | POA: Diagnosis present

## 2022-03-27 DIAGNOSIS — F319 Bipolar disorder, unspecified: Secondary | ICD-10-CM | POA: Diagnosis present

## 2022-03-27 DIAGNOSIS — W19XXXA Unspecified fall, initial encounter: Secondary | ICD-10-CM | POA: Diagnosis present

## 2022-03-27 DIAGNOSIS — Z880 Allergy status to penicillin: Secondary | ICD-10-CM | POA: Diagnosis not present

## 2022-03-27 DIAGNOSIS — L4050 Arthropathic psoriasis, unspecified: Secondary | ICD-10-CM | POA: Diagnosis present

## 2022-03-27 DIAGNOSIS — Z8616 Personal history of COVID-19: Secondary | ICD-10-CM | POA: Diagnosis not present

## 2022-03-27 LAB — CBC
HCT: 30.5 % — ABNORMAL LOW (ref 36.0–46.0)
Hemoglobin: 9.7 g/dL — ABNORMAL LOW (ref 12.0–15.0)
MCH: 29.3 pg (ref 26.0–34.0)
MCHC: 31.8 g/dL (ref 30.0–36.0)
MCV: 92.1 fL (ref 80.0–100.0)
Platelets: 253 10*3/uL (ref 150–400)
RBC: 3.31 MIL/uL — ABNORMAL LOW (ref 3.87–5.11)
RDW: 13.7 % (ref 11.5–15.5)
WBC: 9.1 10*3/uL (ref 4.0–10.5)
nRBC: 0 % (ref 0.0–0.2)

## 2022-03-27 LAB — COMPREHENSIVE METABOLIC PANEL
ALT: 9 U/L (ref 0–44)
AST: 16 U/L (ref 15–41)
Albumin: 2.5 g/dL — ABNORMAL LOW (ref 3.5–5.0)
Alkaline Phosphatase: 51 U/L (ref 38–126)
Anion gap: 8 (ref 5–15)
BUN: 22 mg/dL — ABNORMAL HIGH (ref 6–20)
CO2: 24 mmol/L (ref 22–32)
Calcium: 9 mg/dL (ref 8.9–10.3)
Chloride: 102 mmol/L (ref 98–111)
Creatinine, Ser: 0.94 mg/dL (ref 0.44–1.00)
GFR, Estimated: >60 mL/min (ref >60)
Glucose, Bld: 187 mg/dL — ABNORMAL HIGH (ref 70–99)
Potassium: 4.4 mmol/L (ref 3.5–5.1)
Sodium: 134 mmol/L — ABNORMAL LOW (ref 135–145)
Total Bilirubin: 0.3 mg/dL (ref 0.3–1.2)
Total Protein: 5.9 g/dL — ABNORMAL LOW (ref 6.5–8.1)

## 2022-03-27 LAB — GLUCOSE, CAPILLARY
Glucose-Capillary: 194 mg/dL — ABNORMAL HIGH (ref 70–99)
Glucose-Capillary: 154 mg/dL — ABNORMAL HIGH (ref 70–99)
Glucose-Capillary: 153 mg/dL — ABNORMAL HIGH (ref 70–99)
Glucose-Capillary: 125 mg/dL — ABNORMAL HIGH (ref 70–99)

## 2022-03-27 NOTE — Plan of Care (Signed)
  Problem: Education: Goal: Ability to describe self-care measures that may prevent or decrease complications (Diabetes Survival Skills Education) will improve Outcome: Progressing   Problem: Skin Integrity: Goal: Risk for impaired skin integrity will decrease Outcome: Progressing   Problem: Education: Goal: Knowledge of General Education information will improve Description: Including pain rating scale, medication(s)/side effects and non-pharmacologic comfort measures Outcome: Progressing   Problem: Nutrition: Goal: Adequate nutrition will be maintained Outcome: Progressing   Problem: Pain Managment: Goal: General experience of comfort will improve Outcome: Progressing   Problem: Safety: Goal: Ability to remain free from injury will improve Outcome: Progressing   Problem: Skin Integrity: Goal: Risk for impaired skin integrity will decrease Outcome: Progressing

## 2022-03-27 NOTE — Progress Notes (Signed)
PT Cancellation Note  Patient Details Name: Stacey Ball MRN: [id number] DOB: 05/04/1961   Cancelled Treatment:    Reason Eval/Treat Not Completed: Other (comment). Pt pending orthopedic consult, MD aware. PT to attempt evaluation when clear precautions are documented.   Lieutenant Diego PT, DPT 10:14 AM,03/27/22

## 2022-03-27 NOTE — Progress Notes (Signed)
OT Cancellation Note  Patient Details Name: BENTLEY HARALSON MRN: [id number] DOB: 07-30-1961   Cancelled Treatment:    Reason Eval/Treat Not Completed: Other (comment). Consult received, chart reviewed. Pt pending ortho consult. Will re-attempt OT evaluation at later date/time pending updated plan of care and clear precautions for activity.   Ardeth Perfect., MPH, MS, OTR/L ascom [phone] 03/27/22, 11:11 AM

## 2022-03-27 NOTE — Progress Notes (Addendum)
PROGRESS NOTE    Stacey Ball  XBD:[id number] DOB: 01/08/1961 DOA: 03/26/2022  PCP: Dion Body, MD   Brief Narrative:  This 61 years old female with PMH significant for bipolar disorder, anemia, cataracts, hyperlipidemia, hypertension, hypothyroidism, obesity, type 2 diabetes, CKD stage IIIb presented in the ED with c/o: generalized  weakness and recurrent falls. Patient has generalized weakness for some time which has gotten worse.  She had a fall yesterday and was evaluated outpatient and found to have right humeral fracture and was placed in a sling.  Patient had additional fall yesterday, denies any loss of consciousness or head trauma or head injury. In the ED work-up revealed UA positive for UT.  Lactic acid normal.  x-ray right shoulder shows humeral neck fracture.  ED physician spoke with orthopedics to discuss need for urgent evaluation. Patient is admitted for generalized weakness secondary to UTI started on IV antibiotics.  Assessment & Plan:   Principal Problem:   UTI (urinary tract infection) Active Problems:   Bipolar disorder ([place])   Class 1 obesity due to excess calories with serious comorbidity and body mass index (BMI) of 32.0 to 32.9 in adult   Type 2 diabetes mellitus with stage 3 chronic kidney disease, without long-term current use of insulin (HCC)   Essential hypertension   Hypothyroidism  Generalized weakness /recurrent falls: Likely multifactorial could be secondary to UTI. Patient with chronic generalized weakness, recently got worse and had fall. She suffered  right humeral fracture and was evaluated outpatient,  placed in a sling. She also found to have UTI on urinalysis. Follow up urine culture. Continue IV ceftriaxone X  3 days. PT and OT evaluation.  Right humerus fracture: Orthopedic consulted awaiting recommendation. Adequate pain control with pain medication Hold anticoagulation for now  Bipolar disorder: Continue Seroquel, Depakote,  benztropine and as needed clonazepam.  Hypothyroidism: Continue Synthroid 100 mcg daily  Diabetes mellitus type 2 Continue regular insulin sliding scale,  obtain hemoglobin A1c  CKD stage IIIb.  Renal functions at baseline  Obesity  Estimated body mass index is 33.46 kg/m as calculated from the following:   Height as of 10/21/20: '5\' 7"'$  (1.702 m).   Weight as of 10/24/20: 96.9 kg.  Diet and exercise discussed in detail.  DVT prophylaxis: SCDs for now Code Status: Full code Family Communication: Husband at bedside. Disposition Plan:   Status is: Observation The patient remains OBS appropriate and will d/c before 2 midnights.   Admitted for generalized weakness,  found to have UTI.  PT and OT evaluation pending  Consultants:  Orthopedic  Procedures: None.  Antimicrobials:  Anti-infectives (From admission, onward)    Start     Dose/Rate Route Frequency Ordered Stop   03/27/22 1500  cefTRIAXone (ROCEPHIN) 1 g in sodium chloride 0.9 % 100 mL IVPB        1 g 200 mL/hr over 30 Minutes Intravenous Every 24 hours 03/26/22 1611     03/26/22 1515  cefTRIAXone (ROCEPHIN) 1 g in sodium chloride 0.9 % 100 mL IVPB        1 g 200 mL/hr over 30 Minutes Intravenous  Once 03/26/22 1509 03/26/22 1601        Subjective: Patient was seen and examined at bedside.  Overnight events noted. Patient reports having pain in the right shoulder, also reported burning urination but which is getting better.  Objective: Vitals:   03/26/22 1500 03/26/22 2352 03/27/22 0539 03/27/22 0814  BP: 136/70 128/67 (!) 122/59 (!) 112/54  Pulse:  91 (!) 108 (!) 109 (!) 117  Resp: '10 20 20 17  '$ Temp:  98.5 F (36.9 C) 98.5 F (36.9 C) 98 F (36.7 C)  TempSrc:      SpO2: 99% 100% 99% 96%    Intake/Output Summary (Last 24 hours) at 03/27/2022 1329 Last data filed at 03/27/2022 0617 Gross per 24 hour  Intake 600 ml  Output 1200 ml  Net -600 ml   There were no vitals filed for this  visit.  Examination:  General exam: Appears comfortable, not in any acute distress.  Deconditioned Respiratory system: CTA bilaterally, no wheezing, no crackles, normal respiratory effort. Cardiovascular system: S1-S2 heard, regular rate and rhythm, no murmur. Gastrointestinal system: Abdominal soft, non tender, non distended, BS+ Central nervous system: Alert and oriented x 3. No focal neurological deficits. Extremities: Right shoulder in sling,  reports tenderness, Skin: No rashes, lesions or ulcers Psychiatry: Judgement and insight appear normal. Mood & affect appropriate.     Data Reviewed: I have personally reviewed following labs and imaging studies  CBC: Recent Labs  Lab 03/26/22 1353 03/27/22 0557  WBC 8.5 9.1  NEUTROABS 6.1  --   HGB 10.1* 9.7*  HCT 31.5* 30.5*  MCV 93.8 92.1  PLT 215 563   Basic Metabolic Panel: Recent Labs  Lab 03/26/22 1353 03/26/22 1603 03/27/22 0557  NA 135  --  134*  K 3.4*  --  4.4  CL 101  --  102  CO2 29  --  24  GLUCOSE 171*  --  187*  BUN 21*  --  22*  CREATININE 0.96  --  0.94  CALCIUM 9.1  --  9.0  MG  --  1.5*  --    GFR: CrCl cannot be calculated (Unknown ideal weight.). Liver Function Tests: Recent Labs  Lab 03/26/22 1353 03/27/22 0557  AST 17 16  ALT 10 9  ALKPHOS 53 51  BILITOT 0.7 0.3  PROT 6.4* 5.9*  ALBUMIN 2.6* 2.5*   No results for input(s): "LIPASE", "AMYLASE" in the last 168 hours. No results for input(s): "AMMONIA" in the last 168 hours. Coagulation Profile: No results for input(s): "INR", "PROTIME" in the last 168 hours. Cardiac Enzymes: No results for input(s): "CKTOTAL", "CKMB", "CKMBINDEX", "TROPONINI" in the last 168 hours. BNP (last 3 results) No results for input(s): "PROBNP" in the last 8760 hours. HbA1C: No results for input(s): "HGBA1C" in the last 72 hours. CBG: Recent Labs  Lab 03/26/22 1809 03/26/22 2148 03/27/22 0815 03/27/22 1220  GLUCAP 180* 96 194* 154*   Lipid  Profile: No results for input(s): "CHOL", "HDL", "LDLCALC", "TRIG", "CHOLHDL", "LDLDIRECT" in the last 72 hours. Thyroid Function Tests: No results for input(s): "TSH", "T4TOTAL", "FREET4", "T3FREE", "THYROIDAB" in the last 72 hours. Anemia Panel: No results for input(s): "VITAMINB12", "FOLATE", "FERRITIN", "TIBC", "IRON", "RETICCTPCT" in the last 72 hours. Sepsis Labs: Recent Labs  Lab 03/26/22 1421 03/26/22 1603  LATICACIDVEN 1.5 0.7    No results found for this or any previous visit (from the past 240 hour(s)).   Radiology Studies: DG Shoulder Right  Result Date: 03/26/2022 CLINICAL DATA:  Humerus fracture yesterday. [place] again. Known proximal humerus fracture. EXAM: RIGHT SHOULDER - 2+ VIEW COMPARISON:  Reportedly right shoulder radiographs were performed 03/25/2022 (yesterday) however are unavailable. FINDINGS: There is a markedly comminuted and moderately displaced fracture of the right humeral surgical neck. On frontal view there appears to be approximately 1.5 cm fracture line craniocaudal diastasis. There is a cortical fragment measuring up to  2 cm that appears to be displaced medially 8 mm. There is minimal varus angulation of the fracture. On transscapular Y-view there appears to be anterior angulation of the fracture and anterior displacement of the distal fracture component with respect to the proximal fracture component. There appears to be volume loss from a cortical fracture of the superolateral humeral head greater tuberosity. The humeral head remains appropriately located with respect of the glenoid. Mild acromioclavicular joint space narrowing and peripheral osteophytosis. IMPRESSION: 1. Markedly comminuted and moderately displaced fracture of the right humeral surgical neck. 2. Likely additional comminuted greater tuberosity of the humeral head fracture. Electronically Signed   By: Yvonne Kendall M.D.   On: 03/26/2022 15:05     Scheduled Meds:  benztropine  0.5 mg Oral BID    divalproex  500 mg Oral BID   insulin aspart  0-15 Units Subcutaneous TID WC   levothyroxine  100 mcg Oral QAC breakfast   melatonin  10 mg Oral QHS   QUEtiapine  150 mg Oral QHS   sodium chloride flush  3 mL Intravenous Q12H   Continuous Infusions:  cefTRIAXone (ROCEPHIN)  IV       LOS: 0 days    Time spent: 35 mins    Cori Henningsen, MD [facility]   If 7PM-7AM, please contact night-coverage

## 2022-03-27 NOTE — Consult Note (Signed)
ORTHOPAEDIC CONSULTATION  REQUESTING PHYSICIAN: Shawna Clamp, MD  Chief Complaint: Right shoulder pain  HPI: Stacey Ball is a 61 y.o. female who complains of right shoulder pain after a fall on 03/25/2022.  Patient fell at home and was seen at the [facility] clinic acute care on that date and was x-rayed.  She was found to have a comminuted proximal right humerus fracture and was placed in a sling.  Patient apparently has been lethargic and weak for several days and fell again on Friday, 03/26/2022.  She presented to the emergency room was admitted for dehydration and pain control.  Patient states that the right shoulder is moderately painful  She has multiple medical problems as well.  Hemoglobin is 9.7.  Past Medical History:  Diagnosis Date   Anemia    HX of   Arthritis    Bipolar 1 disorder ([place])    Cancer (HCC)    skin cancer   Cataracts, bilateral    Diabetes mellitus without complication (HCC)    Dyspnea    on exertion (Pt states, "out of shape")   Function kidney decreased    High cholesterol    Hypertension    Hypothyroidism    Psoriasis    Psoriatic arthritis ([place])    Thyroid disease    Past Surgical History:  Procedure Laterality Date   CATARACT EXTRACTION W/PHACO Left 01/10/2020   Procedure: CATARACT EXTRACTION PHACO AND INTRAOCULAR LENS PLACEMENT ([place]) LEFT DIABETIC;  Surgeon: Marchia Meiers, MD;  Location: [place];  Service: Ophthalmology;  Laterality: Left;  5.32 0:41.5   CATARACT EXTRACTION W/PHACO Right 02/07/2020   Procedure: CATARACT EXTRACTION PHACO AND INTRAOCULAR LENS PLACEMENT ([place]) RIGHT DIABETIC VISION BLUE 4.01 00:27.4;  Surgeon: Marchia Meiers, MD;  Location: [place];  Service: Ophthalmology;  Laterality: Right;  Diabetic - oral meds   COLONOSCOPY WITH PROPOFOL N/A 03/04/2016   Procedure: COLONOSCOPY WITH PROPOFOL;  Surgeon: Manya Silvas, MD;  Location: [facility] ENDOSCOPY;  Service: Endoscopy;  Laterality: N/A;   great toe  amputation     matrixectomy     TONSILLECTOMY     Social History   Socioeconomic History   Marital status: Married    Spouse name: Not on file   Number of children: Not on file   Years of education: Not on file   Highest education level: Not on file  Occupational History   Not on file  Tobacco Use   Smoking status: Passive Smoke Exposure - Never Smoker   Smokeless tobacco: Never  Vaping Use   Vaping Use: Never used  Substance and Sexual Activity   Alcohol use: No   Drug use: No   Sexual activity: Never    Birth control/protection: Post-menopausal  Other Topics Concern   Not on file  Social History Narrative   Not on file   Social Determinants of Health   Financial Resource Strain: Not on file  Food Insecurity: Not on file  Transportation Needs: Not on file  Physical Activity: Not on file  Stress: Not on file  Social Connections: Not on file   Family History  Problem Relation Age of Onset   Thyroid disease Mother    Cancer Mother    Stroke Father    Heart failure Father    Allergies  Allergen Reactions   Iodine Solution [Povidone Iodine] Other (See Comments)    States it was used on toes and face broke out   Penicillins Rash   Shellfish Allergy Rash   Prior  to Admission medications   Medication Sig Start Date End Date Taking? Authorizing Provider  benztropine (COGENTIN) 0.5 MG tablet Take 0.5 mg by mouth 2 (two) times daily. 11/11/21  Yes [provider]  divalproex (DEPAKOTE) 250 MG DR tablet Take 500 mg by mouth 2 (two) times daily. breakfast and bedtime   Yes [provider]  levothyroxine (SYNTHROID) 100 MCG tablet Take 100 mcg by mouth daily with breakfast. 12/23/15  Yes [provider]  pioglitazone (ACTOS) 15 MG tablet Take 15 mg by mouth daily.   Yes [provider]  traMADol (ULTRAM) 50 MG tablet Take 50 mg by mouth every 8 (eight) hours as needed. 05/27/15 03/30/22 Yes [provider]  Cholecalciferol (VITAMIN  D3) 125 MCG (5000 UT) CHEW Chew by mouth. dinner    [provider]  clonazePAM (KLONOPIN) 1 MG tablet Take 1 mg by mouth at bedtime. 02/20/22   [provider]  diclofenac sodium (VOLTAREN) 1 % GEL Apply topically 4 (four) times daily.     [provider]  famotidine (PEPCID) 20 MG tablet Take 1 tablet (20 mg total) by mouth daily. 10/24/20 10/24/21  Nolberto Hanlon, MD  fluocinonide ointment (LIDEX) 4.19 % Apply 1 application topically 2 (two) times daily.    [provider]  Melatonin 10 MG SUBL Place under the tongue at bedtime.    [provider]  QUEtiapine (SEROQUEL) 300 MG tablet Take 0.5 tablets (150 mg total) by mouth at bedtime. Patient taking differently: Take 300 mg by mouth at bedtime. 10/24/20   Nolberto Hanlon, MD   DG Shoulder Right  Result Date: 03/26/2022 CLINICAL DATA:  Humerus fracture yesterday. [place] again. Known proximal humerus fracture. EXAM: RIGHT SHOULDER - 2+ VIEW COMPARISON:  Reportedly right shoulder radiographs were performed 03/25/2022 (yesterday) however are unavailable. FINDINGS: There is a markedly comminuted and moderately displaced fracture of the right humeral surgical neck. On frontal view there appears to be approximately 1.5 cm fracture line craniocaudal diastasis. There is a cortical fragment measuring up to 2 cm that appears to be displaced medially 8 mm. There is minimal varus angulation of the fracture. On transscapular Y-view there appears to be anterior angulation of the fracture and anterior displacement of the distal fracture component with respect to the proximal fracture component. There appears to be volume loss from a cortical fracture of the superolateral humeral head greater tuberosity. The humeral head remains appropriately located with respect of the glenoid. Mild acromioclavicular joint space narrowing and peripheral osteophytosis. IMPRESSION: 1. Markedly comminuted and moderately displaced fracture of the right  humeral surgical neck. 2. Likely additional comminuted greater tuberosity of the humeral head fracture. Electronically Signed   By: Yvonne Kendall M.D.   On: 03/26/2022 15:05    Positive ROS: All other systems have been reviewed and were otherwise negative with the exception of those mentioned in the HPI and as above.  Physical Exam: General: Alert, no acute distress Cardiovascular: No pedal edema Respiratory: No cyanosis, no use of accessory musculature GI: No organomegaly, abdomen is soft and non-tender Skin: No lesions in the area of chief complaint Neurologic: Sensation intact distally Psychiatric: Patient is competent for consent with normal mood and affect Lymphatic: No axillary or cervical lymphadenopathy  MUSCULOSKELETAL: Tenderness and swelling around the proximal right shoulder.  Minimal bruising yet.  Neurovascular status good distally.  Patient is lying quietly in her hospital bed and is lethargic in appearance.  Legs show no evidence of trauma with good range of motion of  the hips and knees.  The left upper extremity is normal.  Back is nontender.  Assessment: Comminuted proximal right humerus fracture  Plan: Recommend conservative treatment at this point time.  Patient is not a good operative candidate. Referral to a shoulder specialist at a later date can be done if there is any difficulty with healing.    Park Breed, MD [phone]   03/27/2022 2:39 PM

## 2022-03-28 DIAGNOSIS — N390 Urinary tract infection, site not specified: Secondary | ICD-10-CM | POA: Diagnosis not present

## 2022-03-28 LAB — CBC
HCT: 29.4 % — ABNORMAL LOW (ref 36.0–46.0)
Hemoglobin: 9.7 g/dL — ABNORMAL LOW (ref 12.0–15.0)
MCH: 30 pg (ref 26.0–34.0)
MCHC: 33 g/dL (ref 30.0–36.0)
MCV: 91 fL (ref 80.0–100.0)
Platelets: 243 10*3/uL (ref 150–400)
RBC: 3.23 MIL/uL — ABNORMAL LOW (ref 3.87–5.11)
RDW: 13.6 % (ref 11.5–15.5)
WBC: 9.3 10*3/uL (ref 4.0–10.5)
nRBC: 0.3 % — ABNORMAL HIGH (ref 0.0–0.2)

## 2022-03-28 LAB — GLUCOSE, CAPILLARY
Glucose-Capillary: 137 mg/dL — ABNORMAL HIGH (ref 70–99)
Glucose-Capillary: 229 mg/dL — ABNORMAL HIGH (ref 70–99)
Glucose-Capillary: 137 mg/dL — ABNORMAL HIGH (ref 70–99)
Glucose-Capillary: 150 mg/dL — ABNORMAL HIGH (ref 70–99)

## 2022-03-28 MED ORDER — OXYCODONE HCL 5 MG PO TABS
5.0000 mg | ORAL_TABLET | Freq: Four times a day (QID) | ORAL | Status: DC | PRN
  Administered 2022-03-28 – 2022-03-29 (×5): 5 mg via ORAL
  Filled 2022-03-28 (×5): qty 1

## 2022-03-28 NOTE — NC FL2 (Signed)
[place] LEVEL OF CARE SCREENING TOOL     IDENTIFICATION  Patient Name: Stacey Ball Birthdate: [birth year] Sex: female Admission Date (Current Location): 03/26/2022  [facility] and [place] Number:  Engineering geologist and Address:  [facility], [address] [place], [place] [address]      Provider Number: [id number]  Attending Physician Name and Address:  Shawna Clamp, MD  Relative Name and Phone Number:  Gemmill,Christopher (Spouse)   [phone] (Mobile)    Current Level of Care: Hospital Recommended Level of Care: [place] Prior Approval Number:    Date Approved/Denied:   PASRR Number: pending  Discharge Plan:      Current Diagnoses: Patient Active Problem List   Diagnosis Date Noted   Hypothyroidism 03/26/2022   UTI (urinary tract infection) 03/26/2022   Pneumonia due to COVID-19 virus 10/22/2020   Bipolar disorder ([place]) 10/22/2020   Acute encephalopathy 10/22/2020   Benign hypertensive kidney disease with chronic kidney disease 07/11/2019   Secondary hyperparathyroidism of renal origin ([place]) 07/11/2019   Class 1 obesity due to excess calories with serious comorbidity and body mass index (BMI) of 32.0 to 32.9 in adult 05/09/2019   Type 2 diabetes mellitus with stage 3 chronic kidney disease, without long-term current use of insulin ([place]) 09/16/2017   Stage 3 chronic kidney disease ([place]) 10/04/2016   Essential hypertriglyceridemia 03/22/2016   Essential hypertension 03/17/2016    Orientation RESPIRATION BLADDER Height & Weight     Self, Time, Situation, Place  Normal Incontinent Weight:   Height:     BEHAVIORAL SYMPTOMS/MOOD NEUROLOGICAL BOWEL NUTRITION STATUS        Diet (heart healthy/carb modified)  AMBULATORY STATUS COMMUNICATION OF NEEDS Skin   Limited Assist Verbally Other (Comment) (dry)                       Personal Care Assistance Level of Assistance  Bathing, Feeding, Dressing  Bathing Assistance: Limited assistance Feeding assistance: Limited assistance Dressing Assistance: Limited assistance     Functional Limitations Info             SPECIAL CARE FACTORS FREQUENCY  PT (By licensed PT), OT (By licensed OT)     PT Frequency: 5 times per week OT Frequency: 5 times per week            Contractures      Additional Factors Info  Code Status, Allergies Code Status Info: full Allergies Info: Iodine Solution (Povidone Iodine), Penicillins, Shellfish Allergy           Current Medications (03/28/2022):  This is the current hospital active medication list Current Facility-Administered Medications  Medication Dose Route Frequency Provider Last Rate Last Admin   acetaminophen (TYLENOL) tablet 650 mg  650 mg Oral Q6H PRN Marcelyn Bruins, MD       Or   acetaminophen (TYLENOL) suppository 650 mg  650 mg Rectal Q6H PRN Marcelyn Bruins, MD       benztropine (COGENTIN) tablet 0.5 mg  0.5 mg Oral BID Marcelyn Bruins, MD   0.5 mg at 03/28/22 0941   cefTRIAXone (ROCEPHIN) 1 g in sodium chloride 0.9 % 100 mL IVPB  1 g Intravenous Q24H Marcelyn Bruins, MD 200 mL/hr at 03/27/22 1729 1 g at 03/27/22 1729   clonazePAM (KLONOPIN) tablet 1 mg  1 mg Oral QHS PRN Marcelyn Bruins, MD       divalproex (DEPAKOTE) DR tablet 500 mg  500 mg Oral BID Marcelyn Bruins, MD   500 mg at 03/28/22 0941   insulin aspart (novoLOG) injection 0-15 Units  0-15 Units Subcutaneous TID WC Marcelyn Bruins, MD   5 Units at 03/28/22 1217   levothyroxine (SYNTHROID) tablet 100 mcg  100 mcg Oral QAC breakfast Marcelyn Bruins, MD   100 mcg at 03/28/22 0537   melatonin tablet 10 mg  10 mg Oral QHS Marcelyn Bruins, MD   10 mg at 03/27/22 2125   oxyCODONE (Oxy IR/ROXICODONE) immediate release tablet 5 mg  5 mg Oral Q6H PRN Shawna Clamp, MD   5 mg at 03/28/22 0950   polyethylene glycol (MIRALAX / GLYCOLAX) packet 17 g  17 g Oral Daily PRN Marcelyn Bruins, MD        QUEtiapine (SEROQUEL) tablet 150 mg  150 mg Oral QHS Marcelyn Bruins, MD   150 mg at 03/27/22 2126   sodium chloride flush (NS) 0.9 % injection 3 mL  3 mL Intravenous Q12H Marcelyn Bruins, MD   3 mL at 03/28/22 0941     Discharge Medications: Please see discharge summary for a list of discharge medications.  Relevant Imaging Results:  Relevant Lab Results:   Additional Information SS #: [id number]  [place], LCSW

## 2022-03-28 NOTE — Evaluation (Signed)
Physical Therapy Evaluation Patient Details Name: Stacey Ball MRN: [id number] DOB: [birth year] Today's Date: 03/28/2022  History of Present Illness  61 years old female with PMH significant for bipolar disorder, anemia, cataracts, hyperlipidemia, hypertension, hypothyroidism, obesity, type 2 diabetes, CKD stage IIIb presented in the ED with c/o: generalized  weakness and recurrent falls. Patient has generalized weakness for some time which has gotten worse.  Fall on day of arrival found to have right humeral fracture and was placed in a sling.  Fx will be treated conservatively w/o surgery.  Clinical Impression  Pt showed good effort, but ultimately is very limited with how much she can tolerate.  She is walker reliant at baseline and now unable to use R UE making mobility very difficult.  She has poor tolerance at baseline, but after just 38f of very slow and labored gait using hemiwalker her HR was in the 140s, she was exhausted and ultimately did not show ability to safely return home even with 24/7 assist.  Pt wants to be able to go home but understands her limitations and is open to the idea of rehab.  Spoke with husband on the phone as well, he agrees with plan for STR.        Recommendations for follow up therapy are one component of a multi-disciplinary discharge planning process, led by the attending physician.  Recommendations may be updated based on patient status, additional functional criteria and insurance authorization.  Follow Up Recommendations Skilled nursing-short term rehab (<3 hours/day)    Assistance Recommended at Discharge Frequent or constant Supervision/Assistance  Patient can return home with the following  A lot of help with walking and/or transfers;A little help with bathing/dressing/bathroom;Assistance with cooking/housework;Assistance with feeding;Assist for transportation    Equipment Recommendations  (hemi walker)  Recommendations for Other Services        Functional Status Assessment Patient has had a recent decline in their functional status and demonstrates the ability to make significant improvements in function in a reasonable and predictable amount of time.     Precautions / Restrictions Precautions Precautions: Fall Required Braces or Orthoses: Sling Restrictions Weight Bearing Restrictions: Yes RUE Weight Bearing: Non weight bearing      Mobility  Bed Mobility Overal bed mobility: Needs Assistance Bed Mobility: Supine to Sit     Supine to sit: Mod assist     General bed mobility comments: Pt made good effort and was able to ease LEs toward EOB but even with rail she was unable to lift torso at all and needed assist to get upright to sitting EOB    Transfers Overall transfer level: Needs assistance Equipment used: Hemi-walker Transfers: Sit to/from Stand Sit to Stand: Min assist, From elevated surface           General transfer comment: unable to rise on mutliple attempts from standard height bed (neither with UE on HW nor foot board), raised bed ~3" and with light assist she was laborously able to attain standing    Ambulation/Gait Ambulation/Gait assistance: Min assist Gait Distance (Feet): 8 Feet Assistive device: Hemi-walker         General Gait Details: Pt was very weak and limited ambulation, she struggled to advance either foot more than 2" with each step, struggled to manage hemiwalker and was very quick to fatigue with low grade knee buckling.  Pt's HR into the 140s with the modest effort and ultimately asking to sit rather abruptly due to fatigue.  Stairs  Wheelchair Mobility    Modified Rankin (Stroke Patients Only)       Balance Overall balance assessment: Needs assistance Sitting-balance support: Single extremity supported Sitting balance-Leahy Scale: Good     Standing balance support: Single extremity supported Standing balance-Leahy Scale: Poor Standing balance  comment: no overt LOBs, but highly reliant on AD, poor standing tolerance and general unsteadiness with each weight shift/step                             Pertinent Vitals/Pain Pain Assessment Pain Assessment: 0-10 Pain Score: 7  Pain Location: R shoulder    Home Living Family/patient expects to be discharged to:: Skilled nursing facility                   Additional Comments: 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  No stairs, has canes but uses walker any time she is up (cannot [place] R UE now).    Prior Function Prior Level of Function : Needs assist             Mobility Comments: pt with more and more falls recently, can tolerate only very limited in-home distances. ADLs Comments: husband helps with most ADLs     Hand Dominance        Extremity/Trunk Assessment   Upper Extremity Assessment Upper Extremity Assessment: Generalized weakness (R in sling, L grossly 3/5 t/o)    Lower Extremity Assessment Lower Extremity Assessment: Generalized weakness       Communication   Communication: No difficulties  Cognition Arousal/Alertness: Awake/alert Behavior During Therapy: WFL for tasks assessed/performed Overall Cognitive Status: Within Functional Limits for tasks assessed                                          General Comments General comments (skin integrity, edema, etc.): Pt very weak, history of increasing falls recently, poor activity tolerance    Exercises     Assessment/Plan    PT Assessment Patient needs continued PT services  PT Problem List Decreased strength;Decreased range of motion;Decreased activity tolerance;Decreased balance;Decreased mobility;Decreased coordination;Decreased knowledge of use of DME;Decreased safety awareness;Pain;Cardiopulmonary status limiting activity       PT Treatment Interventions DME instruction;Gait training;Stair training;Functional mobility training;Therapeutic  activities;Therapeutic exercise;Balance training;Neuromuscular re-education;Patient/family education    PT Goals (Current goals can be found in the Care Plan section)  Acute Rehab PT Goals Patient Stated Goal: get stronger PT Goal Formulation: With patient Time For Goal Achievement: 04/10/22 Potential to Achieve Goals: Fair    Frequency Min 2X/week     Co-evaluation               AM-PAC PT "6 Clicks" Mobility  Outcome Measure Help needed turning from your back to your side while in a flat bed without using bedrails?: A Little Help needed moving from lying on your back to sitting on the side of a flat bed without using bedrails?: A Lot Help needed moving to and from a bed to a chair (including a wheelchair)?: A Lot Help needed standing up from a chair using your arms (e.g., wheelchair or bedside chair)?: A Lot Help needed to walk in hospital room?: A Lot Help needed climbing 3-5 steps with a railing? : Total 6 Click Score: 12    End of Session Equipment Utilized During Treatment: Gait belt Activity Tolerance:  Patient limited by fatigue Patient left: with chair alarm set;with call bell/phone within reach Nurse Communication: Mobility status PT Visit Diagnosis: Muscle weakness (generalized) (M62.81);Difficulty in walking, not elsewhere classified (R26.2);Pain;Unsteadiness on feet (R26.81) Pain - Right/Left: Right Pain - part of body: Shoulder    Time: 9628-3662 PT Time Calculation (min) (ACUTE ONLY): 50 min   Charges:   PT Evaluation $PT Eval Low Complexity: 1 Low PT Treatments $Gait Training: 8-22 mins $Therapeutic Activity: 8-22 mins        Kreg Shropshire, DPT 03/28/2022, 10:53 AM

## 2022-03-28 NOTE — TOC Initial Note (Signed)
Transition of Care [facility]) - Initial/Assessment Note    Patient Details  Name: Stacey Ball MRN: [id number] Date of Birth: 1960-11-14  Transition of Care [facility]) CM/SW Contact:    Magnus Ivan, LCSW Phone Number: 03/28/2022, 1:40 PM  Clinical Narrative:                 CSW spoke with patient and spouse regarding SNF rec. Patient lives with her husband who drives her to appointments. PCP is Dr. Netty Starring. Pharmacy is [facility] Dr. Remigio Eisenmenger has a walker and wheelchair at home. No HH or SNF history. Patient and spouse are agreeable to SNF. SNF work up started. Spouse requested a call with bed offers when available.  Expected Discharge Plan: Skilled Nursing Facility Barriers to Discharge: Continued Medical Work up   Patient Goals and CMS Choice Patient states their goals for this hospitalization and ongoing recovery are:: SNF CMS Medicare.gov Compare Post Acute Care list provided to:: Patient Represenative (must comment) Choice offered to / list presented to : Spouse  Expected Discharge Plan and Services Expected Discharge Plan: [place] arrangements for the past 2 months: Single Family Home                                      Prior Living Arrangements/Services Living arrangements for the past 2 months: Single Family Home Lives with:: Spouse Patient language and need for interpreter reviewed:: Yes Do you feel safe going back to the place where you live?: Yes      Need for Family Participation in Patient Care: Yes (Comment) Care giver support system in place?: Yes (comment) Current home services: DME Criminal Activity/Legal Involvement Pertinent to Current Situation/Hospitalization: No - Comment as needed  Activities of Daily Living Home Assistive Devices/Equipment: Eyeglasses, Gilford Rile (specify type) ADL Screening (condition at time of admission) Patient's cognitive ability adequate to safely complete daily  activities?: No Is the patient deaf or have difficulty hearing?: No Does the patient have difficulty seeing, even when wearing glasses/contacts?: No Does the patient have difficulty concentrating, remembering, or making decisions?: No Patient able to express need for assistance with ADLs?: Yes Does the patient have difficulty dressing or bathing?: Yes Independently performs ADLs?: No Does the patient have difficulty walking or climbing stairs?: Yes Weakness of Legs: Both Weakness of Arms/Hands: Right  Permission Sought/Granted Permission sought to share information with : Facility Sport and exercise psychologist, Family Supports Permission granted to share information with : Yes, Verbal Permission Granted  Share Information with NAME: spouse Harrell Gave  Permission granted to share info w AGENCY: SNFs        Emotional Assessment       Orientation: : Oriented to Self, Oriented to Place, Oriented to  Time, Oriented to Situation Alcohol / Substance Use: Not Applicable Psych Involvement: No (comment)  Admission diagnosis:  UTI (urinary tract infection) [N39.0] Generalized weakness [R53.1] Closed fracture of head of right humerus, initial encounter [C12.751Z] Urinary tract infection without hematuria, site unspecified [N39.0] Patient Active Problem List   Diagnosis Date Noted   Hypothyroidism 03/26/2022   UTI (urinary tract infection) 03/26/2022   Pneumonia due to COVID-19 virus 10/22/2020   Bipolar disorder ([place]) 10/22/2020   Acute encephalopathy 10/22/2020   Benign hypertensive kidney disease with chronic kidney disease 07/11/2019   Secondary hyperparathyroidism of renal origin ([place]) 07/11/2019   Class 1 obesity due to excess calories  with serious comorbidity and body mass index (BMI) of 32.0 to 32.9 in adult 05/09/2019   Type 2 diabetes mellitus with stage 3 chronic kidney disease, without long-term current use of insulin ([place]) 09/16/2017   Stage 3 chronic kidney disease ([place])  10/04/2016   Essential hypertriglyceridemia 03/22/2016   Essential hypertension 03/17/2016   PCP:  Dion Body, MD Pharmacy:   [id number] [address], [place] [address] [place] [address] Phone: [phone] Fax: [phone]     Social Determinants of Health (SDOH) Interventions    Readmission Risk Interventions    03/28/2022    1:38 PM  Readmission Risk Prevention Plan  Post Dischage Appt Complete  Medication Screening Complete  Transportation Screening Complete

## 2022-03-28 NOTE — Plan of Care (Signed)

## 2022-03-28 NOTE — [place] Notes (Signed)
Pt crying because she is over wheelmed with everything that is going on and not being able to cut her own food.

## 2022-03-28 NOTE — Progress Notes (Signed)
PROGRESS NOTE    Stacey Ball  WIO:[id number] DOB: [birth year] DOA: 03/26/2022  PCP: Dion Body, MD   Brief Narrative:  This 61 years old female with PMH significant for bipolar disorder, anemia, cataracts, hyperlipidemia, hypertension, hypothyroidism, obesity, type 2 diabetes, CKD stage IIIb presented in the ED with c/o: generalized  weakness and recurrent falls. Patient has generalized weakness for some time which has gotten worse.  She had a fall yesterday and was evaluated outpatient and found to have right humeral fracture and was placed in a sling.  Patient had additional fall yesterday, denies any loss of consciousness or head trauma or head injury. In the ED work-up revealed UA positive for UTI.  Lactic acid normal.  x-ray right shoulder shows humeral neck fracture.  ED physician spoke with orthopedics to discuss need for urgent evaluation. Patient is admitted for generalized weakness secondary to UTI started on IV antibiotics.  Orthopedic consulted recommended conservative management.  Patient is not a candidate for surgical intervention.  Assessment & Plan:   Principal Problem:   UTI (urinary tract infection) Active Problems:   Bipolar disorder ([place])   Class 1 obesity due to excess calories with serious comorbidity and body mass index (BMI) of 32.0 to 32.9 in adult   Type 2 diabetes mellitus with stage 3 chronic kidney disease, without long-term current use of insulin (HCC)   Essential hypertension   Hypothyroidism  Generalized weakness /recurrent falls: Likely multifactorial could be secondary to UTI. Patient with chronic generalized weakness, recently got worse and had fall. She suffered  right humeral fracture and was evaluated outpatient,  placed in a sling. She also found to have UTI on urinalysis. Follow up urine culture. Continue IV ceftriaxone X  3 days. PT and OT recommended skilled nursing facility for rehabilitation.  Right humerus fracture: Orthopedic  consulted , recommended patient is not a candidate for surgical intervention. Recommended outpatient follow-up with conservative management. Adequate pain control with pain medication Hold anticoagulation for now  Bipolar disorder: Continue Seroquel, Depakote, benztropine and as needed clonazepam.  Hypothyroidism: Continue Synthroid 100 mcg daily  Diabetes mellitus type 2 Continue regular insulin sliding scale,  obtain hemoglobin A1c  CKD stage IIIb.  Renal functions at baseline  Obesity  Estimated body mass index is 33.46 kg/m as calculated from the following:   Height as of 10/21/20: '5\' 7"'$  (1.702 m).   Weight as of 10/24/20: 96.9 kg.  Diet and exercise discussed in detail.  DVT prophylaxis: SCDs for now Code Status: Full code Family Communication: Husband at bedside. Disposition Plan:   Status is: Inpatient Remains inpatient appropriate because:    Admitted for generalized weakness,  found to have UTI.  Right humeral fracture orthopedic recommended conservative management.  PT recommended SNF, authorization process initiated.  Consultants:  Orthopedic  Procedures: None.  Antimicrobials:  Anti-infectives (From admission, onward)    Start     Dose/Rate Route Frequency Ordered Stop   03/27/22 1500  cefTRIAXone (ROCEPHIN) 1 g in sodium chloride 0.9 % 100 mL IVPB        1 g 200 mL/hr over 30 Minutes Intravenous Every 24 hours 03/26/22 1611     03/26/22 1515  cefTRIAXone (ROCEPHIN) 1 g in sodium chloride 0.9 % 100 mL IVPB        1 g 200 mL/hr over 30 Minutes Intravenous  Once 03/26/22 1509 03/26/22 1601        Subjective: Patient was seen and examined at bedside.  Overnight events noted. Patient reports still  having pain in the right shoulder,  asking to increase her pain medication. Patient also reports having burning urination which is getting better.   Objective: Vitals:   03/27/22 1725 03/27/22 1929 03/28/22 0343 03/28/22 0807  BP: (!) 146/78 (!) 145/79  131/75 116/62  Pulse: (!) 101 96 (!) 105 (!) 104  Resp: '16 18 18 16  '$ Temp: 98.8 F (37.1 C) 98.2 F (36.8 C) 98.9 F (37.2 C) 98.9 F (37.2 C)  TempSrc:      SpO2: 100% 100% 95% 96%    Intake/Output Summary (Last 24 hours) at 03/28/2022 1124 Last data filed at 03/28/2022 0941 Gross per 24 hour  Intake 3 ml  Output 1350 ml  Net -1347 ml   There were no vitals filed for this visit.  Examination:  General exam: Appears comfortable, not in any acute distress.  Deconditioned Respiratory system: CTA bilaterally, normal respiratory effort, no accessory muscle use. Cardiovascular system: S1-S2 heard, regular rate and rhythm, no murmur. Gastrointestinal system: Abdomen is soft, non tender, non distended, BS+ Central nervous system: Alert and oriented x 3. No focal neurological deficits. Extremities: Right shoulder in sling, tenderness noted, restricted movement Skin: No rashes, lesions or ulcers Psychiatry: Judgement and insight appear normal. Mood & affect appropriate.     Data Reviewed: I have personally reviewed following labs and imaging studies  CBC: Recent Labs  Lab 03/26/22 1353 03/27/22 0557 03/28/22 0525  WBC 8.5 9.1 9.3  NEUTROABS 6.1  --   --   HGB 10.1* 9.7* 9.7*  HCT 31.5* 30.5* 29.4*  MCV 93.8 92.1 91.0  PLT 215 253 790   Basic Metabolic Panel: Recent Labs  Lab 03/26/22 1353 03/26/22 1603 03/27/22 0557  NA 135  --  134*  K 3.4*  --  4.4  CL 101  --  102  CO2 29  --  24  GLUCOSE 171*  --  187*  BUN 21*  --  22*  CREATININE 0.96  --  0.94  CALCIUM 9.1  --  9.0  MG  --  1.5*  --    GFR: CrCl cannot be calculated (Unknown ideal weight.). Liver Function Tests: Recent Labs  Lab 03/26/22 1353 03/27/22 0557  AST 17 16  ALT 10 9  ALKPHOS 53 51  BILITOT 0.7 0.3  PROT 6.4* 5.9*  ALBUMIN 2.6* 2.5*   No results for input(s): "LIPASE", "AMYLASE" in the last 168 hours. No results for input(s): "AMMONIA" in the last 168 hours. Coagulation  Profile: No results for input(s): "INR", "PROTIME" in the last 168 hours. Cardiac Enzymes: No results for input(s): "CKTOTAL", "CKMB", "CKMBINDEX", "TROPONINI" in the last 168 hours. BNP (last 3 results) No results for input(s): "PROBNP" in the last 8760 hours. HbA1C: No results for input(s): "HGBA1C" in the last 72 hours. CBG: Recent Labs  Lab 03/27/22 0815 03/27/22 1220 03/27/22 1656 03/27/22 2109 03/28/22 0808  GLUCAP 194* 154* 153* 125* 137*   Lipid Profile: No results for input(s): "CHOL", "HDL", "LDLCALC", "TRIG", "CHOLHDL", "LDLDIRECT" in the last 72 hours. Thyroid Function Tests: No results for input(s): "TSH", "T4TOTAL", "FREET4", "T3FREE", "THYROIDAB" in the last 72 hours. Anemia Panel: No results for input(s): "VITAMINB12", "FOLATE", "FERRITIN", "TIBC", "IRON", "RETICCTPCT" in the last 72 hours. Sepsis Labs: Recent Labs  Lab 03/26/22 1421 03/26/22 1603  LATICACIDVEN 1.5 0.7    No results found for this or any previous visit (from the past 240 hour(s)).   Radiology Studies: DG Shoulder Right  Result Date: 03/26/2022 CLINICAL DATA:  Humerus  fracture yesterday. [place] again. Known proximal humerus fracture. EXAM: RIGHT SHOULDER - 2+ VIEW COMPARISON:  Reportedly right shoulder radiographs were performed 03/25/2022 (yesterday) however are unavailable. FINDINGS: There is a markedly comminuted and moderately displaced fracture of the right humeral surgical neck. On frontal view there appears to be approximately 1.5 cm fracture line craniocaudal diastasis. There is a cortical fragment measuring up to 2 cm that appears to be displaced medially 8 mm. There is minimal varus angulation of the fracture. On transscapular Y-view there appears to be anterior angulation of the fracture and anterior displacement of the distal fracture component with respect to the proximal fracture component. There appears to be volume loss from a cortical fracture of the superolateral humeral head greater  tuberosity. The humeral head remains appropriately located with respect of the glenoid. Mild acromioclavicular joint space narrowing and peripheral osteophytosis. IMPRESSION: 1. Markedly comminuted and moderately displaced fracture of the right humeral surgical neck. 2. Likely additional comminuted greater tuberosity of the humeral head fracture. Electronically Signed   By: Yvonne Kendall M.D.   On: 03/26/2022 15:05     Scheduled Meds:  benztropine  0.5 mg Oral BID   divalproex  500 mg Oral BID   insulin aspart  0-15 Units Subcutaneous TID WC   levothyroxine  100 mcg Oral QAC breakfast   melatonin  10 mg Oral QHS   QUEtiapine  150 mg Oral QHS   sodium chloride flush  3 mL Intravenous Q12H   Continuous Infusions:  cefTRIAXone (ROCEPHIN)  IV 1 g (03/27/22 1729)     LOS: 1 day    Time spent: 35 mins    Gavina Dildine, MD [facility]   If 7PM-7AM, please contact night-coverage

## 2022-03-28 NOTE — Evaluation (Signed)
Occupational Therapy Evaluation Patient Details Name: Stacey Ball MRN: [id number] DOB: 03/26/1961 Today's Date: 03/28/2022   History of Present Illness 61 years old female with PMH significant for bipolar disorder, anemia, cataracts, hyperlipidemia, hypertension, hypothyroidism, obesity, type 2 diabetes, CKD stage IIIb presented in the ED with c/o: generalized  weakness and recurrent falls. Patient has generalized weakness for some time which has gotten worse.  Fall on day of arrival found to have right humeral fracture and was placed in a sling.  Fx will be treated conservatively w/o surgery.   Clinical Impression   Stacey Ball was seen for OT evaluation this date. Prior to hospital admission, pt required at least some assist for functional mobility and tub/shower transfers from her spouse. Pt lives with her spouse in a 1 level home with tub shower. Currently pt demonstrates impairments as described below (See OT problem list) which functionally limit her ability to perform ADL/self-care tasks. Pt currently requires MOD A for STS from a low surface and MIN GUARD for static standing. She requires MAX A to adjust sling for improved comfort. Anticipate MAX A for UB/LB dressing and bathing 2/2 NWB through her RLE and generalized weakness.  Pt would benefit from skilled OT services to address noted impairments and functional limitations (see below for any additional details) in order to maximize safety and independence while minimizing falls risk and caregiver burden. Upon hospital discharge, recommend STR to maximize pt safety and return to PLOF.        Recommendations for follow up therapy are one component of a multi-disciplinary discharge planning process, led by the attending physician.  Recommendations may be updated based on patient status, additional functional criteria and insurance authorization.   Follow Up Recommendations  Skilled nursing-short term rehab (<3 hours/day)    Assistance  Recommended at Discharge Intermittent Supervision/Assistance  Patient can return home with the following A lot of help with bathing/dressing/bathroom;A lot of help with walking and/or transfers;Assistance with cooking/housework;Assistance with feeding;Help with stairs or ramp for entrance;Assist for transportation    Functional Status Assessment  Patient has had a recent decline in their functional status and demonstrates the ability to make significant improvements in function in a reasonable and predictable amount of time.  Equipment Recommendations  BSC/3in1    Recommendations for Other Services       Precautions / Restrictions Precautions Precautions: Fall Precaution Comments: Per secure chat with MD Sabra Heck "No movement or any treatment to the arm" RLE Required Braces or Orthoses: Sling Restrictions Weight Bearing Restrictions: Yes RUE Weight Bearing: Non weight bearing      Mobility Bed Mobility               General bed mobility comments: Deferred. Pt in recliner at start/end of session.    Transfers Overall transfer level: Needs assistance Equipment used: Hemi-walker Transfers: Sit to/from Stand Sit to Stand: Mod assist           General transfer comment: MOD A for STS from recliner with cues for hand/foot placement and safe use of hemi-walker.      Balance Overall balance assessment: Needs assistance Sitting-balance support: Single extremity supported Sitting balance-Leahy Scale: Good     Standing balance support: Single extremity supported Standing balance-Leahy Scale: Poor Standing balance comment: no overt LOBs, but highly reliant on AD, poor standing tolerance and general unsteadiness  ADL either performed or assessed with clinical judgement   ADL Overall ADL's : Needs assistance/impaired                                       General ADL Comments: Pt is significantly functionally limited by  increased pain and decreased functional use of her R (dominant) UE, generalized weakness, and decreased activity tolerance. MOD A for STS with close min guard during static standing. MAX A for sling adjustment/UB dressing. MAX A for LB ADL management. MIN GUARD to MIN A for toilet transfer to [name].     Vision Patient Visual Report: No change from baseline       Perception     Praxis      Pertinent Vitals/Pain Pain Assessment Pain Assessment: 0-10 Pain Score: 6  Pain Location: R shoulder Pain Descriptors / Indicators: Constant, Grimacing, Guarding, Sore Pain Intervention(s): Monitored during session, Limited activity within patient's tolerance, Premedicated before session, Repositioned     Hand Dominance Right   Extremity/Trunk Assessment Upper Extremity Assessment Upper Extremity Assessment: Generalized weakness;RUE deficits/detail RUE Deficits / Details: To remain in sling, NWB, with no active use per MD. RUE: Unable to fully assess due to pain;Unable to fully assess due to immobilization   Lower Extremity Assessment Lower Extremity Assessment: Generalized weakness       Communication Communication Communication: No difficulties   Cognition Arousal/Alertness: Awake/alert Behavior During Therapy: WFL for tasks assessed/performed Overall Cognitive Status: Within Functional Limits for tasks assessed                                       General Comments       Exercises Other Exercises Other Exercises: Pt/caregiver educated on role of OT in acute setting, RUE NWB restrictions/precautions, safe use of AE/DME for adl management, DC recs and routines modifications to support safety and functional independence.   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  No stairs, has canes but  uses walker any time she is up (cannot [place] R UE now).      Prior Functioning/Environment Prior Level of Function : Needs assist       Physical Assist : Mobility (physical);ADLs (physical) Mobility (physical): Gait ADLs (physical): IADLs;Dressing;Bathing Mobility Comments: pt with more and more falls recently, can tolerate only very limited in-home distances using RW and assist from spouse. ADLs Comments: husband helps with most ADLs/IADLs        OT Problem List: Decreased strength;Decreased activity tolerance;Decreased safety awareness;Decreased knowledge of use of DME or AE;Impaired balance (sitting and/or standing);Decreased coordination;Pain;Decreased range of motion;Impaired UE functional use      OT Treatment/Interventions: Self-care/ADL training;Therapeutic exercise;Therapeutic activities;DME and/or AE instruction;Patient/family education;Balance training;Energy conservation    OT Goals(Current goals can be found in the care plan section) Acute Rehab OT Goals Patient Stated Goal: to feel better OT Goal Formulation: With patient Time For Goal Achievement: 04/11/22 Potential to Achieve Goals: Good ADL Goals Pt Will Perform Grooming: sitting;with set-up;with supervision Pt Will Perform Upper Body Dressing: with  set-up;with supervision;sitting Pt Will Perform Lower Body Dressing: with min assist;sit to/from stand;with adaptive equipment;with caregiver independent in assisting (c LRAD PRN) Pt Will Transfer to Toilet: bedside commode;with set-up;with supervision;ambulating (c LRAD PRN) Pt Will Perform Toileting - Clothing Manipulation and hygiene: sit to/from stand;with adaptive equipment;with caregiver independent in assisting;with set-up;with supervision (c LRAD PRN)  OT Frequency: Min 2X/week    Co-evaluation              AM-PAC OT "6 Clicks" Daily Activity     Outcome Measure Help from another person eating meals?: A Little Help from another person taking care of  personal grooming?: A Little Help from another person toileting, which includes using toliet, bedpan, or urinal?: A Lot Help from another person bathing (including washing, rinsing, drying)?: A Lot Help from another person to put on and taking off regular upper body clothing?: A Lot Help from another person to put on and taking off regular lower body clothing?: A Lot 6 Click Score: 14   End of Session Equipment Utilized During Treatment: Gait belt;Other (comment) (hemi-walker)  Activity Tolerance: Patient tolerated treatment well Patient left: in chair  OT Visit Diagnosis: Other abnormalities of gait and mobility (R26.89);Muscle weakness (generalized) (M62.81)                Time: 2263-3354 OT Time Calculation (min): 34 min Charges:  OT General Charges $OT Visit: 1 Visit OT Evaluation $OT Eval Moderate Complexity: 1 Mod OT Treatments $Self Care/Home Management : 23-37 mins  Shara Blazing, M.S., OTR/L Ascom: [phone] 03/28/22, 2:08 PM

## 2022-03-29 LAB — HEMOGLOBIN A1C
Hgb A1c MFr Bld: 6 % — ABNORMAL HIGH (ref 4.8–5.6)
Mean Plasma Glucose: 125.5 mg/dL

## 2022-03-29 LAB — CBC
HCT: 29.6 % — ABNORMAL LOW (ref 36.0–46.0)
Hemoglobin: 9.7 g/dL — ABNORMAL LOW (ref 12.0–15.0)
MCH: 29.8 pg (ref 26.0–34.0)
MCHC: 32.8 g/dL (ref 30.0–36.0)
MCV: 91.1 fL (ref 80.0–100.0)
Platelets: 263 10*3/uL (ref 150–400)
RBC: 3.25 MIL/uL — ABNORMAL LOW (ref 3.87–5.11)
RDW: 13.5 % (ref 11.5–15.5)
WBC: 9.4 10*3/uL (ref 4.0–10.5)
nRBC: 0.2 % (ref 0.0–0.2)

## 2022-03-29 LAB — BASIC METABOLIC PANEL
Anion gap: 8 (ref 5–15)
BUN: 19 mg/dL (ref 6–20)
CO2: 29 mmol/L (ref 22–32)
Calcium: 9.1 mg/dL (ref 8.9–10.3)
Chloride: 99 mmol/L (ref 98–111)
Creatinine, Ser: 0.93 mg/dL (ref 0.44–1.00)
GFR, Estimated: >60 mL/min (ref >60)
Glucose, Bld: 144 mg/dL — ABNORMAL HIGH (ref 70–99)
Potassium: 3.9 mmol/L (ref 3.5–5.1)
Sodium: 136 mmol/L (ref 135–145)

## 2022-03-29 LAB — GLUCOSE, CAPILLARY
Glucose-Capillary: 127 mg/dL — ABNORMAL HIGH (ref 70–99)
Glucose-Capillary: 143 mg/dL — ABNORMAL HIGH (ref 70–99)
Glucose-Capillary: 151 mg/dL — ABNORMAL HIGH (ref 70–99)
Glucose-Capillary: 163 mg/dL — ABNORMAL HIGH (ref 70–99)

## 2022-03-29 NOTE — Clinical Social Work Note (Signed)
RE:  Stacey Ball   Date of Birth:   07-12-1962___________  Date: 03/29/2022       To Whom It May Concern:  Please be advised that the above-named patient will require a short-term nursing home stay - anticipated 30 days or less for rehabilitation and strengthening.  The plan is for return home.  MD ELECTRONIC SIGNATURE NOTED BELOW

## 2022-03-29 NOTE — Progress Notes (Signed)
Occupational Therapy Treatment Patient Details Name: EZELLE SURPRENANT MRN: [id number] DOB: 18-Feb-1961 Today's Date: 03/29/2022   History of present illness 61 years old female with PMH significant for bipolar disorder, anemia, cataracts, hyperlipidemia, hypertension, hypothyroidism, obesity, type 2 diabetes, CKD stage IIIb presented in the ED with c/o: generalized  weakness and recurrent falls. Patient has generalized weakness for some time which has gotten worse.  Fall on day of arrival found to have right humeral fracture and was placed in a sling.  Fx will be treated conservatively w/o surgery.   OT comments  Ms. Zimmerle was seen for OT treatment on this date. Upon arrival to room pt awake/alert, semi-supine in bed after just finishing up PT session. Pt endorses fatigue from PT session, but agreeable to EOB functional activity. OT facilitated seated UB grooming tasks as described below (see ADL section for additional detail). Pt requires MOD-MAX A for bed mobility and SET UP- MIN A for seated ADL management. Pt educated on compensatory strategies for 1 handed ADL management t/o session. She demonstrates limited recall of education from past sessions and will continue to benefit from reinforcement and ADL training. Pt continues to benefit from skilled OT services to maximize return to PLOF and minimize risk of future falls, injury, caregiver burden, and readmission. Will continue to follow POC as written. Discharge recommendation remains appropriate.     Recommendations for follow up therapy are one component of a multi-disciplinary discharge planning process, led by the attending physician.  Recommendations may be updated based on patient status, additional functional criteria and insurance authorization.    Follow Up Recommendations  Skilled nursing-short term rehab (<3 hours/day)    Assistance Recommended at Discharge Intermittent Supervision/Assistance  Patient can return home with the  following  A lot of help with bathing/dressing/bathroom;A lot of help with walking and/or transfers;Assistance with cooking/housework;Assistance with feeding;Help with stairs or ramp for entrance;Assist for transportation   Equipment Recommendations  BSC/3in1    Recommendations for Other Services      Precautions / Restrictions Precautions Precautions: Fall Precaution Comments: Per secure chat with MD Sabra Heck "No movement or any treatment to the arm" RLE Required Braces or Orthoses: Sling Restrictions Weight Bearing Restrictions: Yes RUE Weight Bearing: Non weight bearing       Mobility Bed Mobility Overal bed mobility: Needs Assistance Bed Mobility: Supine to Sit, Sit to Supine     Supine to sit: Mod assist Sit to supine: Max assist        Transfers                         Balance Overall balance assessment: Needs assistance Sitting-balance support: Single extremity supported Sitting balance-Leahy Scale: Poor Sitting balance - Comments: Requires intermittent MIN A to maintain sitting at EOB during functional activity. Postural control: Right lateral lean                                 ADL either performed or assessed with clinical judgement   ADL Overall ADL's : Needs assistance/impaired                                       General ADL Comments: Pt continues to be functionally limited by decreased activity tolerance, generalized weakness, and impaired functional use of her RUE. She requires MOD A  for bed mobility, SET UP for seated grooming with intermittent MIN A for sitting balance as pt fatigues quickly in sitting at EOB.    Extremity/Trunk Assessment              Vision       Perception     Praxis      Cognition Arousal/Alertness: Awake/alert Behavior During Therapy: WFL for tasks assessed/performed, Flat affect Overall Cognitive Status: Within Functional Limits for tasks assessed                                           Exercises Other Exercises Other Exercises: OT facilitated bed mobility and seated grooming tasks as described above. Pt educated on compensatory strategies for 1 handed ADL management t/o session. She demonstrates limited recall of education from past sessions and will continue to benefit from reinforcement and ADL training.    Shoulder Instructions       General Comments      Pertinent Vitals/ Pain       Pain Assessment Pain Score: 6  Pain Location: R shoulder Pain Descriptors / Indicators: Constant, Grimacing, Guarding, Sore Pain Intervention(s): Limited activity within patient's tolerance, Monitored during session, Repositioned  Home Living                                          Prior Functioning/Environment              Frequency  Min 2X/week        Progress Toward Goals  OT Goals(current goals can now be found in the care plan section)  Progress towards OT goals: Progressing toward goals  Acute Rehab OT Goals Patient Stated Goal: to feel better OT Goal Formulation: With patient Time For Goal Achievement: 04/11/22 Potential to Achieve Goals: Good  Plan Discharge plan remains appropriate;Frequency remains appropriate    Co-evaluation                 AM-PAC OT "6 Clicks" Daily Activity     Outcome Measure   Help from another person eating meals?: A Little Help from another person taking care of personal grooming?: A Little Help from another person toileting, which includes using toliet, bedpan, or urinal?: A Lot Help from another person bathing (including washing, rinsing, drying)?: A Lot Help from another person to put on and taking off regular upper body clothing?: A Lot Help from another person to put on and taking off regular lower body clothing?: A Lot 6 Click Score: 14    End of Session    OT Visit Diagnosis: Other abnormalities of gait and mobility (R26.89);Muscle weakness  (generalized) (M62.81)   Activity Tolerance Patient limited by fatigue   Patient Left in bed;with call bell/phone within reach;with bed alarm set   Nurse Communication          Time: 1453-1520 OT Time Calculation (min): 27 min  Charges: OT General Charges $OT Visit: 1 Visit OT Treatments $Self Care/Home Management : 23-37 mins   Shara Blazing, M.S., OTR/L Ascom: [phone] 03/29/22, 3:38 PM

## 2022-03-29 NOTE — TOC Progression Note (Addendum)
Transition of Care [facility]) - Progression Note    Patient Details  Name: Stacey Ball MRN: [id number] Date of Birth: Aug 03, 1961  Transition of Care [facility]) CM/SW Contact  Eileen Stanford, LCSW Phone Number: 03/29/2022, 12:16 PM  Clinical Narrative:   CSW provided bed offers. Pt is going to look into them. CSW did notify pt that Josem Kaufmann has to be started today. Pt ask that CSW follow up with her in a bit.  Choice is [facility] [place] started.   Expected Discharge Plan: [place] Barriers to Discharge: Continued Medical Work up  Expected Discharge Plan and Services Expected Discharge Plan: [place] arrangements for the past 2 months: Single Family Home                                       Social Determinants of Health (SDOH) Interventions    Readmission Risk Interventions    03/28/2022    1:38 PM  Readmission Risk Prevention Plan  Post Dischage Appt Complete  Medication Screening Complete  Transportation Screening Complete

## 2022-03-29 NOTE — Progress Notes (Signed)
Physical Therapy Treatment Patient Details Name: Stacey Ball MRN: [id number] DOB: 1961-02-27 Today's Date: 03/29/2022   History of Present Illness 61 years old female with PMH significant for bipolar disorder, anemia, cataracts, hyperlipidemia, hypertension, hypothyroidism, obesity, type 2 diabetes, CKD stage IIIb presented in the ED with c/o: generalized  weakness and recurrent falls. Patient has generalized weakness for some time which has gotten worse.  Fall on day of arrival found to have right humeral fracture and was placed in a sling.  Fx will be treated conservatively w/o surgery.    PT Comments    Pt was pleasant and motivated to participate during the session and put forth good effort throughout. Pt was able to complete bed mobility w/ modA and required trunk control and help to complete scoot EOB and help for leg management when moving from sit to supine. Pt was unable to complete sit to stand from lowered bed surface and required modA to initiate stand and verbal cuing for hand placement. Bed was slightly elevated and pt was able to perform stands w/ minA. Pt was able to side step to [facility] w/ min foot clearance and required verbal cuing for sequencing. Pt very limited by fatigue and needed longer rest between exercises. Pt will benefit from PT services in a SNF setting upon discharge to safely address deficits listed in patient problem list for decreased caregiver assistance and eventual return to PLOF.   Recommendations for follow up therapy are one component of a multi-disciplinary discharge planning process, led by the attending physician.  Recommendations may be updated based on patient status, additional functional criteria and insurance authorization.  Follow Up Recommendations  Skilled nursing-short term rehab (<3 hours/day)     Assistance Recommended at Discharge Intermittent Supervision/Assistance  Patient can return home with the following A lot of help with walking and/or  transfers;A little help with bathing/dressing/bathroom;Assistance with cooking/housework;Assistance with feeding;Assist for transportation;Help with stairs or ramp for entrance   Equipment Recommendations  Other (comment) (hemiwalker)    Recommendations for Other Services       Precautions / Restrictions Precautions Precautions: Fall Precaution Comments: Per secure chat with MD Sabra Heck "No movement or any treatment to the arm" RLE Required Braces or Orthoses: Sling Restrictions Weight Bearing Restrictions: Yes RUE Weight Bearing: Non weight bearing     Mobility  Bed Mobility Overal bed mobility: Needs Assistance Bed Mobility: Supine to Sit, Sit to Supine     Supine to sit: Mod assist Sit to supine: Mod assist   General bed mobility comments: ModA for trunk control and leg mangement.    Transfers Overall transfer level: Needs assistance Equipment used: Hemi-walker Transfers: Sit to/from Stand Sit to Stand: Mod assist           General transfer comment: modA to initiate stand and verbal cuing for hand placement; bed elevated to aid w/ stand    Ambulation/Gait Ambulation/Gait assistance: Min guard Gait Distance (Feet): 2 Feet Assistive device: Hemi-walker Gait Pattern/deviations: Step-to pattern, Decreased step length - right, Decreased step length - left       General Gait Details: Pt able to side step toward [facility] and with verbal cues for sequencing and w/ min foot clearance. Minor knee buckling noted but able to self correct.    Stairs             Wheelchair Mobility    Modified Rankin (Stroke Patients Only)       Balance Overall balance assessment: Needs assistance Sitting-balance support: Single extremity supported,  Feet supported Sitting balance-Leahy Scale: Fair       Standing balance-Leahy Scale: Poor                              Cognition Arousal/Alertness: Awake/alert Behavior During Therapy: WFL for tasks  assessed/performed, Flat affect Overall Cognitive Status: Within Functional Limits for tasks assessed                                          Exercises      General Comments        Pertinent Vitals/Pain Pain Assessment Pain Assessment: 0-10 Pain Score: 5  Pain Descriptors / Indicators: Constant, Guarding, Sore Pain Intervention(s): Monitored during session, Premedicated before session, Repositioned    Home Living                          Prior Function            PT Goals (current goals can now be found in the care plan section) Progress towards PT goals: Progressing toward goals    Frequency    Min 2X/week      PT Plan Current plan remains appropriate    Co-evaluation              AM-PAC PT "6 Clicks" Mobility   Outcome Measure  Help needed turning from your back to your side while in a flat bed without using bedrails?: A Little Help needed moving from lying on your back to sitting on the side of a flat bed without using bedrails?: A Lot Help needed moving to and from a bed to a chair (including a wheelchair)?: A Lot Help needed standing up from a chair using your arms (e.g., wheelchair or bedside chair)?: A Lot Help needed to walk in hospital room?: A Lot Help needed climbing 3-5 steps with a railing? : Total 6 Click Score: 12    End of Session Equipment Utilized During Treatment: Gait belt Activity Tolerance: Patient limited by fatigue Patient left: in bed;with call bell/phone within reach;with bed alarm set Nurse Communication: Mobility status PT Visit Diagnosis: Muscle weakness (generalized) (M62.81);Difficulty in walking, not elsewhere classified (R26.2);Pain;Unsteadiness on feet (R26.81) Pain - Right/Left: Right Pain - part of body: Shoulder     Time: 9024-0973 PT Time Calculation (min) (ACUTE ONLY): 23 min  Charges:                       Turner Daniels, SPT  03/29/2022, 4:37 PM

## 2022-03-29 NOTE — Progress Notes (Signed)
OT Cancellation Note  Patient Details Name: Stacey Ball MRN: [id number] DOB: 12/20/1960   Cancelled Treatment:    Reason Eval/Treat Not Completed: Patient at procedure or test/ unavailable. OT attempted to see pt for tx session, however, upon arrival to pt room physical therapist present beginning session. Will hold and re-attempt at a later time/date as available and pt medically appropriate for OT services.   Shara Blazing, M.S., OTR/L Ascom: [phone] 03/29/22, 2:22 PM

## 2022-03-29 NOTE — Progress Notes (Signed)
PROGRESS NOTE    Stacey Ball  FOY:[id number] DOB: 1960-12-15 DOA: 03/26/2022  PCP: Dion Body, MD   Brief Narrative:  This 61 years old female with PMH significant for bipolar disorder, anemia, cataracts, hyperlipidemia, hypertension, hypothyroidism, obesity, type 2 diabetes, CKD stage IIIb presented in the ED with c/o: generalized  weakness and recurrent falls. Patient has generalized weakness for some time which has gotten worse.  She had a fall yesterday and was evaluated outpatient and found to have right humeral fracture and was placed in a sling.  Patient had additional fall yesterday, denies any loss of consciousness or head trauma or head injury. In the ED work-up revealed UA positive for UTI.  Lactic acid normal.  x-ray right shoulder shows humeral neck fracture.  ED physician spoke with orthopedics to discuss need for urgent evaluation. Patient is admitted for generalized weakness secondary to UTI started on IV antibiotics.  Orthopedics consulted recommended conservative management.  Patient is not a candidate for surgical intervention.  Assessment & Plan:   Principal Problem:   UTI (urinary tract infection) Active Problems:   Bipolar disorder ([place])   Class 1 obesity due to excess calories with serious comorbidity and body mass index (BMI) of 32.0 to 32.9 in adult   Type 2 diabetes mellitus with stage 3 chronic kidney disease, without long-term current use of insulin (HCC)   Essential hypertension   Hypothyroidism  Generalized weakness /recurrent falls: Likely multifactorial could be secondary to UTI. Patient with chronic generalized weakness, recently got worse and had fall. She suffered  right humeral fracture and was evaluated outpatient,  placed in a sling. She also found to have UTI on urinalysis. Continue IV ceftriaxone X  3 days. PT and OT recommended skilled nursing facility for rehabilitation.  Right humerus fracture: Orthopedic consulted , recommended  patient is not a candidate for surgical intervention. Recommended outpatient follow-up with conservative management. Adequate pain control with pain medication Hold anticoagulation for now  Bipolar disorder: Continue Seroquel, Depakote, benztropine and as needed clonazepam.  Hypothyroidism: Continue Synthroid 100 mcg daily  Diabetes mellitus type 2 Continue regular insulin sliding scale,  HB A1c 6.0  CKD stage IIIb.  Renal functions at baseline.  Obesity  Estimated body mass index is 33.46 kg/m as calculated from the following:   Height as of 10/21/20: '5\' 7"'$  (1.702 m).   Weight as of 10/24/20: 96.9 kg.  Diet and exercise discussed in detail.  DVT prophylaxis: SCDs for now Code Status: Full code Family Communication: Husband at bedside. Disposition Plan:   Status is: Inpatient Remains inpatient appropriate because:    Admitted for generalized weakness,  found to have UTI.  Right humeral fracture orthopedic recommended conservative management.  PT recommended SNF, authorization process initiated.  Consultants:  Orthopedic  Procedures: None.  Antimicrobials:  Anti-infectives (From admission, onward)    Start     Dose/Rate Route Frequency Ordered Stop   03/27/22 1500  cefTRIAXone (ROCEPHIN) 1 g in sodium chloride 0.9 % 100 mL IVPB        1 g 200 mL/hr over 30 Minutes Intravenous Every 24 hours 03/26/22 1611     03/26/22 1515  cefTRIAXone (ROCEPHIN) 1 g in sodium chloride 0.9 % 100 mL IVPB        1 g 200 mL/hr over 30 Minutes Intravenous  Once 03/26/22 1509 03/26/22 1601        Subjective: Patient was seen and examined at bedside.  Overnight events noted. Patient reports still having pain in the  right shoulder but manageable with pain medications. She reported urinary frequency and burning has improved.   Objective: Vitals:   03/28/22 1506 03/28/22 2115 03/29/22 0427 03/29/22 0814  BP: (!) 147/73 134/71 122/65 121/77  Pulse: (!) 105 (!) 104 (!) 108 (!) 105   Resp: '17 16 16 16  '$ Temp: 98.7 F (37.1 C) 98.8 F (37.1 C) 98.1 F (36.7 C) 97.9 F (36.6 C)  TempSrc:   Oral   SpO2: 96% 100% 95% 99%    Intake/Output Summary (Last 24 hours) at 03/29/2022 1402 Last data filed at 03/29/2022 0800 Gross per 24 hour  Intake 688.82 ml  Output 2500 ml  Net -1811.18 ml   There were no vitals filed for this visit.  Examination:  General exam: Appears comfortable, not in any acute distress.  Deconditioned Respiratory system: CTA bilaterally, normal respiratory effort, no accessory muscle use. Cardiovascular system: S1-S2 heard, regular rate and rhythm, no murmur. Gastrointestinal system: Abdomen is soft, nontender, nondistended, positive bowel sounds Central nervous system: Alert and oriented x 3. No focal neurological deficits. Extremities: Right shoulder in sling, tenderness noted, restricted movements. Skin: No rashes, lesions or ulcers Psychiatry: Judgement and insight appear normal. Mood & affect appropriate.     Data Reviewed: I have personally reviewed following labs and imaging studies  CBC: Recent Labs  Lab 03/26/22 1353 03/27/22 0557 03/28/22 0525 03/29/22 0516  WBC 8.5 9.1 9.3 9.4  NEUTROABS 6.1  --   --   --   HGB 10.1* 9.7* 9.7* 9.7*  HCT 31.5* 30.5* 29.4* 29.6*  MCV 93.8 92.1 91.0 91.1  PLT 215 253 243 179   Basic Metabolic Panel: Recent Labs  Lab 03/26/22 1353 03/26/22 1603 03/27/22 0557 03/29/22 0516  NA 135  --  134* 136  K 3.4*  --  4.4 3.9  CL 101  --  102 99  CO2 29  --  24 29  GLUCOSE 171*  --  187* 144*  BUN 21*  --  22* 19  CREATININE 0.96  --  0.94 0.93  CALCIUM 9.1  --  9.0 9.1  MG  --  1.5*  --   --    GFR: CrCl cannot be calculated (Unknown ideal weight.). Liver Function Tests: Recent Labs  Lab 03/26/22 1353 03/27/22 0557  AST 17 16  ALT 10 9  ALKPHOS 53 51  BILITOT 0.7 0.3  PROT 6.4* 5.9*  ALBUMIN 2.6* 2.5*   No results for input(s): "LIPASE", "AMYLASE" in the last 168 hours. No  results for input(s): "AMMONIA" in the last 168 hours. Coagulation Profile: No results for input(s): "INR", "PROTIME" in the last 168 hours. Cardiac Enzymes: No results for input(s): "CKTOTAL", "CKMB", "CKMBINDEX", "TROPONINI" in the last 168 hours. BNP (last 3 results) No results for input(s): "PROBNP" in the last 8760 hours. HbA1C: Recent Labs    03/29/22 0516  HGBA1C 6.0*   CBG: Recent Labs  Lab 03/28/22 1138 03/28/22 1624 03/28/22 2117 03/29/22 0825 03/29/22 1203  GLUCAP 229* 137* 150* 127* 143*   Lipid Profile: No results for input(s): "CHOL", "HDL", "LDLCALC", "TRIG", "CHOLHDL", "LDLDIRECT" in the last 72 hours. Thyroid Function Tests: No results for input(s): "TSH", "T4TOTAL", "FREET4", "T3FREE", "THYROIDAB" in the last 72 hours. Anemia Panel: No results for input(s): "VITAMINB12", "FOLATE", "FERRITIN", "TIBC", "IRON", "RETICCTPCT" in the last 72 hours. Sepsis Labs: Recent Labs  Lab 03/26/22 1421 03/26/22 1603  LATICACIDVEN 1.5 0.7    No results found for this or any previous visit (from the past  240 hour(s)).   Radiology Studies: No results found.   Scheduled Meds:  benztropine  0.5 mg Oral BID   divalproex  500 mg Oral BID   insulin aspart  0-15 Units Subcutaneous TID WC   levothyroxine  100 mcg Oral QAC breakfast   melatonin  10 mg Oral QHS   QUEtiapine  150 mg Oral QHS   sodium chloride flush  3 mL Intravenous Q12H   Continuous Infusions:  cefTRIAXone (ROCEPHIN)  IV Stopped (03/28/22 1533)     LOS: 2 days    Time spent: 35 mins    Ilyas Lipsitz, MD [facility]   If 7PM-7AM, please contact night-coverage

## 2022-03-29 NOTE — Plan of Care (Signed)

## 2022-03-30 DIAGNOSIS — N2581 Secondary hyperparathyroidism of renal origin: Secondary | ICD-10-CM | POA: Diagnosis not present

## 2022-03-30 DIAGNOSIS — M6281 Muscle weakness (generalized): Secondary | ICD-10-CM | POA: Diagnosis not present

## 2022-03-30 DIAGNOSIS — K5900 Constipation, unspecified: Secondary | ICD-10-CM | POA: Diagnosis not present

## 2022-03-30 DIAGNOSIS — M25562 Pain in left knee: Secondary | ICD-10-CM | POA: Diagnosis not present

## 2022-03-30 DIAGNOSIS — E781 Pure hyperglyceridemia: Secondary | ICD-10-CM | POA: Diagnosis not present

## 2022-03-30 DIAGNOSIS — E119 Type 2 diabetes mellitus without complications: Secondary | ICD-10-CM | POA: Diagnosis not present

## 2022-03-30 DIAGNOSIS — E6609 Other obesity due to excess calories: Secondary | ICD-10-CM | POA: Diagnosis not present

## 2022-03-30 DIAGNOSIS — L409 Psoriasis, unspecified: Secondary | ICD-10-CM | POA: Diagnosis not present

## 2022-03-30 DIAGNOSIS — G251 Drug-induced tremor: Secondary | ICD-10-CM | POA: Diagnosis not present

## 2022-03-30 DIAGNOSIS — R609 Edema, unspecified: Secondary | ICD-10-CM | POA: Diagnosis not present

## 2022-03-30 DIAGNOSIS — N390 Urinary tract infection, site not specified: Secondary | ICD-10-CM | POA: Diagnosis not present

## 2022-03-30 DIAGNOSIS — M25561 Pain in right knee: Secondary | ICD-10-CM | POA: Diagnosis not present

## 2022-03-30 DIAGNOSIS — R12 Heartburn: Secondary | ICD-10-CM | POA: Diagnosis not present

## 2022-03-30 DIAGNOSIS — F5101 Primary insomnia: Secondary | ICD-10-CM | POA: Diagnosis not present

## 2022-03-30 DIAGNOSIS — E0590 Thyrotoxicosis, unspecified without thyrotoxic crisis or storm: Secondary | ICD-10-CM | POA: Diagnosis not present

## 2022-03-30 DIAGNOSIS — Z9181 History of falling: Secondary | ICD-10-CM | POA: Diagnosis not present

## 2022-03-30 DIAGNOSIS — Z789 Other specified health status: Secondary | ICD-10-CM | POA: Diagnosis not present

## 2022-03-30 DIAGNOSIS — R5381 Other malaise: Secondary | ICD-10-CM | POA: Diagnosis not present

## 2022-03-30 DIAGNOSIS — Z743 Need for continuous supervision: Secondary | ICD-10-CM | POA: Diagnosis not present

## 2022-03-30 DIAGNOSIS — E559 Vitamin D deficiency, unspecified: Secondary | ICD-10-CM | POA: Diagnosis not present

## 2022-03-30 DIAGNOSIS — F310 Bipolar disorder, current episode hypomanic: Secondary | ICD-10-CM | POA: Diagnosis not present

## 2022-03-30 DIAGNOSIS — E039 Hypothyroidism, unspecified: Secondary | ICD-10-CM | POA: Diagnosis not present

## 2022-03-30 DIAGNOSIS — E1122 Type 2 diabetes mellitus with diabetic chronic kidney disease: Secondary | ICD-10-CM | POA: Diagnosis not present

## 2022-03-30 DIAGNOSIS — F411 Generalized anxiety disorder: Secondary | ICD-10-CM | POA: Diagnosis not present

## 2022-03-30 DIAGNOSIS — G4700 Insomnia, unspecified: Secondary | ICD-10-CM | POA: Diagnosis not present

## 2022-03-30 DIAGNOSIS — N183 Chronic kidney disease, stage 3 unspecified: Secondary | ICD-10-CM | POA: Diagnosis not present

## 2022-03-30 DIAGNOSIS — R531 Weakness: Secondary | ICD-10-CM | POA: Diagnosis not present

## 2022-03-30 DIAGNOSIS — R41841 Cognitive communication deficit: Secondary | ICD-10-CM | POA: Diagnosis not present

## 2022-03-30 DIAGNOSIS — I10 Essential (primary) hypertension: Secondary | ICD-10-CM | POA: Diagnosis not present

## 2022-03-30 DIAGNOSIS — F3132 Bipolar disorder, current episode depressed, moderate: Secondary | ICD-10-CM | POA: Diagnosis not present

## 2022-03-30 DIAGNOSIS — Z79899 Other long term (current) drug therapy: Secondary | ICD-10-CM | POA: Diagnosis not present

## 2022-03-30 DIAGNOSIS — Z6832 Body mass index (BMI) 32.0-32.9, adult: Secondary | ICD-10-CM | POA: Diagnosis not present

## 2022-03-30 DIAGNOSIS — F319 Bipolar disorder, unspecified: Secondary | ICD-10-CM | POA: Diagnosis not present

## 2022-03-30 DIAGNOSIS — S42201A Unspecified fracture of upper end of right humerus, initial encounter for closed fracture: Secondary | ICD-10-CM | POA: Diagnosis not present

## 2022-03-30 DIAGNOSIS — S42291D Other displaced fracture of upper end of right humerus, subsequent encounter for fracture with routine healing: Secondary | ICD-10-CM | POA: Diagnosis not present

## 2022-03-30 DIAGNOSIS — I129 Hypertensive chronic kidney disease with stage 1 through stage 4 chronic kidney disease, or unspecified chronic kidney disease: Secondary | ICD-10-CM | POA: Diagnosis not present

## 2022-03-30 LAB — GLUCOSE, CAPILLARY
Glucose-Capillary: 147 mg/dL — ABNORMAL HIGH (ref 70–99)
Glucose-Capillary: 192 mg/dL — ABNORMAL HIGH (ref 70–99)

## 2022-03-30 NOTE — TOC Progression Note (Signed)
Transition of Care [facility]) - Progression Note    Patient Details  Name: Stacey Ball MRN: [id number] Date of Birth: 06-16-1961  Transition of Care [facility]) CM/SW [place], LCSW Phone Number: 03/30/2022, 2:39 PM  Clinical Narrative:   Kenney Houseman at [facility] confirmed pt has HTA [facility]m Kaufmann via [place] [facility], approval [phone].    Expected Discharge Plan: [place] Barriers to Discharge: Continued Medical Work up  Expected Discharge Plan and Services Expected Discharge Plan: [place] arrangements for the past 2 months: Single Family Home Expected Discharge Date: 03/30/22                                     Social Determinants of Health (SDOH) Interventions    Readmission Risk Interventions    03/28/2022    1:38 PM  Readmission Risk Prevention Plan  Post Dischage Appt Complete  Medication Screening Complete  Transportation Screening Complete

## 2022-03-30 NOTE — Discharge Summary (Signed)
[place] Discharge Summary  JORDAN CARAVEO IRC:[id number] DOB: 10-04-1961 DOA: 03/26/2022  PCP: Dion Body, MD  Admit date: 03/26/2022  Discharge date: 03/30/2022  Admitted From: Home  Disposition:  SNF  Recommendations for Outpatient Follow-up:  Follow up with PCP in 1-2 weeks Please obtain BMP/CBC in one week Advised to follow-up with orthopedics as scheduled.  Home Health: None Equipment/Devices: None  Discharge Condition: Stable CODE STATUS:Full code Diet recommendation: Heart Healthy   Brief [facility] Course: This 61 years old female with PMH significant for bipolar disorder, anemia, cataracts, hyperlipidemia, hypertension, hypothyroidism, obesity, type 2 diabetes, CKD stage IIIb presented in the ED with c/o: generalized  weakness and recurrent falls. Patient has generalized weakness for some time which has gotten worse.  She had a fall yesterday and was evaluated outpatient and found to have right humeral fracture and was placed in a sling.  Patient had additional fall yesterday, denies any loss of consciousness or head trauma or head injury. In the ED work-up revealed UA positive for UTI.  Lactic acid normal.  x-ray right shoulder shows humeral neck fracture. ED physician spoke with orthopedics to discuss need for urgent evaluation. Patient was admitted for generalized weakness secondary to UTI,  started on IV antibiotics.  Orthopedics consulted,  recommended conservative management. Patient is not a candidate for surgical intervention.  Advised outpatient follow-up.  Patient completed antibiotics for 3 days. PT recommended  skilled nursing facility for rehab.  Patient feels better and want to be discharged.  Patient being discharged skilled nursing facility for rehab.    Discharge Diagnoses:  Principal Problem:   UTI (urinary tract infection) Active Problems:   Bipolar disorder ([place])   Class 1 obesity due to excess calories with serious comorbidity and body mass  index (BMI) of 32.0 to 32.9 in adult   Type 2 diabetes mellitus with stage 3 chronic kidney disease, without long-term current use of insulin (HCC)   Essential hypertension   Hypothyroidism  Generalized weakness /recurrent falls: Likely multifactorial could be secondary to UTI. Patient with chronic generalized weakness, recently got worse and had fall. She suffered  right humeral fracture and was evaluated outpatient,  placed in a sling. She also found to have UTI on urinalysis. Completed antibiotic course for 3 days. PT and OT recommended skilled nursing facility for rehabilitation.   Right humerus fracture: Orthopedics consulted , recommended patient is not a candidate for surgical intervention. Recommended outpatient follow-up with conservative management. Adequate pain control with pain medication. Hold anticoagulation for now   Bipolar disorder: Continue Seroquel, Depakote, benztropine and as needed clonazepam.   Hypothyroidism: Continue Synthroid 100 mcg daily.   Diabetes mellitus type 2 Continue regular insulin sliding scale,  HB A1c 6.0   CKD stage IIIb.  Renal functions at baseline.   Obesity  Estimated body mass index is 33.46 kg/m as calculated from the following:   Height as of 10/21/20: '5\' 7"'$  (1.702 m).   Weight as of 10/24/20: 96.9 kg.  Diet and exercise discussed in detail.  Discharge Instructions  Discharge Instructions     Call MD for:  difficulty breathing, headache or visual disturbances   Complete by: As directed    Call MD for:  persistant dizziness or light-headedness   Complete by: As directed    Call MD for:  persistant nausea and vomiting   Complete by: As directed    Diet - low sodium heart healthy   Complete by: As directed    Diet Carb Modified  Complete by: As directed    Discharge instructions   Complete by: As directed    Advised to follow-up with primary care physician in 1 week. Advised to follow-up with orthopedics for right  shoulder fracture.   Increase activity slowly   Complete by: As directed       Allergies as of 03/30/2022       Reactions   Iodine Solution [povidone Iodine] Other (See Comments)   States it was used on toes and face broke out   Penicillins Rash   Shellfish Allergy Rash        Medication List     TAKE these medications    benztropine 0.5 MG tablet Commonly known as: COGENTIN Take 0.5 mg by mouth 2 (two) times daily.   clonazePAM 1 MG tablet Commonly known as: KLONOPIN Take 1 mg by mouth at bedtime.   diclofenac sodium 1 % Gel Commonly known as: VOLTAREN Apply topically 4 (four) times daily.   divalproex 250 MG DR tablet Commonly known as: DEPAKOTE Take 500 mg by mouth 2 (two) times daily. breakfast and bedtime   famotidine 20 MG tablet Commonly known as: PEPCID Take 1 tablet (20 mg total) by mouth daily.   fluocinonide ointment 0.05 % Commonly known as: LIDEX Apply 1 application topically 2 (two) times daily.   levothyroxine 100 MCG tablet Commonly known as: SYNTHROID Take 100 mcg by mouth daily with breakfast.   Melatonin 10 MG Subl Place under the tongue at bedtime.   pioglitazone 15 MG tablet Commonly known as: ACTOS Take 15 mg by mouth daily.   QUEtiapine 300 MG tablet Commonly known as: SEROQUEL Take 0.5 tablets (150 mg total) by mouth at bedtime. What changed: how much to take   traMADol 50 MG tablet Commonly known as: ULTRAM Take 50 mg by mouth every 8 (eight) hours as needed.   Vitamin D3 125 MCG (5000 UT) Chew Chew by mouth. dinner        Contact information for follow-up providers     Dion Body, MD Follow up in 1 week(s).   Specialty: Family Medicine Contact information: [place] [address] [phone]         Earnestine Leys, MD Follow up in 1 week(s).   Specialty: Orthopedic Surgery Contact information: [place] [address] [phone]               Contact information for after-discharge care     [facility] .   Service: Skilled Nursing Contact information: [place] [address] [phone]                    Allergies  Allergen Reactions   Iodine Solution [Povidone Iodine] Other (See Comments)    States it was used on toes and face broke out   Penicillins Rash   Shellfish Allergy Rash    Consultations: Orthopedics   Procedures/Studies: DG Shoulder Right  Result Date: 03/26/2022 CLINICAL DATA:  Humerus fracture yesterday. [place] again. Known proximal humerus fracture. EXAM: RIGHT SHOULDER - 2+ VIEW COMPARISON:  Reportedly right shoulder radiographs were performed 03/25/2022 (yesterday) however are unavailable. FINDINGS: There is a markedly comminuted and moderately displaced fracture of the right humeral surgical neck. On frontal view there appears to be approximately 1.5 cm fracture line craniocaudal diastasis. There is a cortical fragment measuring up to 2 cm that appears to be displaced medially  8 mm. There is minimal varus angulation of the fracture. On transscapular Y-view there appears to be anterior angulation of the fracture and anterior displacement of the distal fracture component with respect to the proximal fracture component. There appears to be volume loss from a cortical fracture of the superolateral humeral head greater tuberosity. The humeral head remains appropriately located with respect of the glenoid. Mild acromioclavicular joint space narrowing and peripheral osteophytosis. IMPRESSION: 1. Markedly comminuted and moderately displaced fracture of the right humeral surgical neck. 2. Likely additional comminuted greater tuberosity of the humeral head fracture. Electronically Signed   By: Yvonne Kendall M.D.   On: 03/26/2022 15:05      Subjective: Patient was seen and examined at bedside.  Overnight events noted.    Patient reports feeling much improved.  Patient is being discharged to SNF.  Discharge Exam: Vitals:   03/30/22 0800 03/30/22 1113  BP: 120/69 (!) 117/53  Pulse: (!) 105 (!) 107  Resp:    Temp: 97.9 F (36.6 C) 97.9 F (36.6 C)  SpO2: 92% 93%   Vitals:   03/29/22 2008 03/30/22 0417 03/30/22 0800 03/30/22 1113  BP: 139/74 137/75 120/69 (!) 117/53  Pulse: (!) 103 (!) 107 (!) 105 (!) 107  Resp: 17 17    Temp: 99.2 F (37.3 C) 98.7 F (37.1 C) 97.9 F (36.6 C) 97.9 F (36.6 C)  TempSrc:    Oral  SpO2: 98% 91% 92% 93%    General: Pt is alert, awake, not in acute distress Cardiovascular: RRR, S1/S2 +, no rubs, no gallops Respiratory: CTA bilaterally, no wheezing, no rhonchi Abdominal: Soft, NT, ND, bowel sounds + Extremities: no edema, no cyanosis    The results of significant diagnostics from this hospitalization (including imaging, microbiology, ancillary and laboratory) are listed below for reference.     Microbiology: No results found for this or any previous visit (from the past 240 hour(s)).   Labs: BNP (last 3 results) No results for input(s): "BNP" in the last 8760 hours. Basic Metabolic Panel: Recent Labs  Lab 03/26/22 1353 03/26/22 1603 03/27/22 0557 03/29/22 0516  NA 135  --  134* 136  K 3.4*  --  4.4 3.9  CL 101  --  102 99  CO2 29  --  24 29  GLUCOSE 171*  --  187* 144*  BUN 21*  --  22* 19  CREATININE 0.96  --  0.94 0.93  CALCIUM 9.1  --  9.0 9.1  MG  --  1.5*  --   --    Liver Function Tests: Recent Labs  Lab 03/26/22 1353 03/27/22 0557  AST 17 16  ALT 10 9  ALKPHOS 53 51  BILITOT 0.7 0.3  PROT 6.4* 5.9*  ALBUMIN 2.6* 2.5*   No results for input(s): "LIPASE", "AMYLASE" in the last 168 hours. No results for input(s): "AMMONIA" in the last 168 hours. CBC: Recent Labs  Lab 03/26/22 1353 03/27/22 0557 03/28/22 0525 03/29/22 0516  WBC 8.5 9.1 9.3 9.4  NEUTROABS 6.1  --   --   --   HGB 10.1* 9.7* 9.7* 9.7*  HCT 31.5* 30.5*  29.4* 29.6*  MCV 93.8 92.1 91.0 91.1  PLT 215 253 243 263   Cardiac Enzymes: No results for input(s): "CKTOTAL", "CKMB", "CKMBINDEX", "TROPONINI" in the last 168 hours. BNP: Invalid input(s): "POCBNP" CBG: Recent Labs  Lab 03/29/22 1203 03/29/22 1609 03/29/22 2300 03/30/22 0823 03/30/22 1202  GLUCAP 143* 151* 163* 147* 192*   D-Dimer No  results for input(s): "DDIMER" in the last 72 hours. Hgb A1c Recent Labs    03/29/22 0516  HGBA1C 6.0*   Lipid Profile No results for input(s): "CHOL", "HDL", "LDLCALC", "TRIG", "CHOLHDL", "LDLDIRECT" in the last 72 hours. Thyroid function studies No results for input(s): "TSH", "T4TOTAL", "T3FREE", "THYROIDAB" in the last 72 hours.  Invalid input(s): "FREET3" Anemia work up No results for input(s): "VITAMINB12", "FOLATE", "FERRITIN", "TIBC", "IRON", "RETICCTPCT" in the last 72 hours. Urinalysis    Component Value Date/Time   COLORURINE YELLOW (A) 03/26/2022 1353   APPEARANCEUR HAZY (A) 03/26/2022 1353   LABSPEC 1.014 03/26/2022 1353   PHURINE 5.0 03/26/2022 1353   GLUCOSEU 50 (A) 03/26/2022 1353   HGBUR SMALL (A) 03/26/2022 1353   BILIRUBINUR NEGATIVE 03/26/2022 1353   KETONESUR NEGATIVE 03/26/2022 1353   PROTEINUR NEGATIVE 03/26/2022 1353   NITRITE POSITIVE (A) 03/26/2022 1353   LEUKOCYTESUR TRACE (A) 03/26/2022 1353   Sepsis Labs Recent Labs  Lab 03/26/22 1353 03/27/22 0557 03/28/22 0525 03/29/22 0516  WBC 8.5 9.1 9.3 9.4   Microbiology No results found for this or any previous visit (from the past 240 hour(s)).   Time coordinating discharge: Over 30 minutes  SIGNED:   Shawna Clamp, MD  [facility] 03/30/2022, 2:38 PM Pager   If 7PM-7AM, please contact night-coverage

## 2022-03-30 NOTE — Care Management Important Message (Signed)
Important Message  Patient Details  Name: Stacey Ball MRN: [id number] Date of Birth: [birth year]   Medicare Important Message Given:  Yes     Juliann Pulse A Tavio Biegel 03/30/2022, 12:44 PM

## 2022-03-30 NOTE — Progress Notes (Signed)
Physical Therapy Treatment Patient Details Name: Stacey Ball MRN: [id number] DOB: 12/14/1960 Today's Date: 03/30/2022   History of Present Illness 61 years old female with PMH significant for bipolar disorder, anemia, cataracts, hyperlipidemia, hypertension, hypothyroidism, obesity, type 2 diabetes, CKD stage IIIb presented in the ED with c/o: generalized  weakness and recurrent falls. Patient has generalized weakness for some time which has gotten worse.  Fall on day of arrival found to have right humeral fracture and was placed in a sling.  Fx will be treated conservatively w/o surgery.    PT Comments    Pt was pleasant and motivated to participate during the session and put forth good effort throughout. Pt was able to complete bed mobility w/ modA for trunk control and required minA for leg management when going from sit to supine. Pt with difficulty performing sitting balance EOB this session and reports they feel like falling back despite anterior lean. Pt required minA for stability and moving to midline in sitting. Pt completed dynamic sitting balance w/ minA moving posteriorly and to the L side outside of BOS to help with orientation with good carry over. Pt complete sit to stand transfer x3 w/ modA to initiate stand and verbal/physical cuing for hand placement. Pt attempted side step to [facility] w/ very minimal  foot clearance and difficulty to maintain upright posture. Pt with decreased tolerance in standing position this session. Pt will benefit from PT services in a SNF setting upon discharge to safely address deficits listed in patient problem list for decreased caregiver assistance and eventual return to PLOF.   Recommendations for follow up therapy are one component of a multi-disciplinary discharge planning process, led by the attending physician.  Recommendations may be updated based on patient status, additional functional criteria and insurance authorization.  Follow Up  Recommendations  Skilled nursing-short term rehab (<3 hours/day)     Assistance Recommended at Discharge Intermittent Supervision/Assistance  Patient can return home with the following A lot of help with walking and/or transfers;A little help with bathing/dressing/bathroom;Assistance with cooking/housework;Assistance with feeding;Assist for transportation;Help with stairs or ramp for entrance   Equipment Recommendations  Other (comment) (hemiwalker)    Recommendations for Other Services       Precautions / Restrictions Precautions Precautions: Fall Precaution Comments: Per secure chat with MD Sabra Heck "No movement or any treatment to the arm" RLE Required Braces or Orthoses: Sling Restrictions Weight Bearing Restrictions: Yes RUE Weight Bearing: Non weight bearing     Mobility  Bed Mobility Overal bed mobility: Needs Assistance Bed Mobility: Supine to Sit, Sit to Supine     Supine to sit: Mod assist Sit to supine: Mod assist   General bed mobility comments: ModA for trunk control and leg mangement.    Transfers Overall transfer level: Needs assistance Equipment used: Hemi-walker Transfers: Sit to/from Stand Sit to Stand: Mod assist           General transfer comment: modA to initiate stand and verbal cuing for hand placement; bed elevated to aid w/ stand    Ambulation/Gait Ambulation/Gait assistance: Min guard   Assistive device: Hemi-walker Gait Pattern/deviations: Step-to pattern, Decreased step length - right, Decreased step length - left       General Gait Details: attempted to sidestep toward [facility] w/ difficulty to maintain upright posture; very minimal shuffle before needing to sit down   Stairs             Wheelchair Mobility    Modified Rankin (Stroke Patients Only)  Balance Overall balance assessment: Needs assistance Sitting-balance support: Single extremity supported, Feet supported Sitting balance-Leahy Scale: Poor Sitting  balance - Comments: required intermittent minA to maintain sitting at EOB; difficulty to return to midline and to lean L and back   Standing balance support: Single extremity supported Standing balance-Leahy Scale: Poor Standing balance comment: difficulty to maintain upright posture w/ poor standing tolerance                            Cognition Arousal/Alertness: Awake/alert Behavior During Therapy: WFL for tasks assessed/performed, Flat affect Overall Cognitive Status: Within Functional Limits for tasks assessed                                          Exercises Other Exercises Other Exercises: weight shifting in sitting    General Comments        Pertinent Vitals/Pain Pain Assessment Pain Assessment: 0-10 Pain Score:  (pt unable to give number, states it's "not too bad") Pain Location: R shoulder Pain Descriptors / Indicators: Constant, Guarding, Sore Pain Intervention(s): Monitored during session, Repositioned    Home Living                          Prior Function            PT Goals (current goals can now be found in the care plan section) Progress towards PT goals: Progressing toward goals    Frequency    Min 2X/week      PT Plan Current plan remains appropriate    Co-evaluation              AM-PAC PT "6 Clicks" Mobility   Outcome Measure  Help needed turning from your back to your side while in a flat bed without using bedrails?: A Little Help needed moving from lying on your back to sitting on the side of a flat bed without using bedrails?: A Lot Help needed moving to and from a bed to a chair (including a wheelchair)?: A Lot Help needed standing up from a chair using your arms (e.g., wheelchair or bedside chair)?: A Lot Help needed to walk in hospital room?: A Lot Help needed climbing 3-5 steps with a railing? : Total 6 Click Score: 12    End of Session Equipment Utilized During Treatment: Gait  belt Activity Tolerance: Patient tolerated treatment well Patient left: in bed;with call bell/phone within reach;with bed alarm set Nurse Communication: Mobility status PT Visit Diagnosis: Muscle weakness (generalized) (M62.81);Difficulty in walking, not elsewhere classified (R26.2);Pain;Unsteadiness on feet (R26.81) Pain - Right/Left: Right Pain - part of body: Shoulder     Time: 1013-1040 PT Time Calculation (min) (ACUTE ONLY): 27 min  Charges:                        Turner Daniels, SPT  03/30/2022, 1:01 PM

## 2022-03-30 NOTE — TOC Progression Note (Addendum)
Transition of Care [facility]) - Progression Note    Patient Details  Name: Stacey Ball MRN: [id number] Date of Birth: Dec 07, 1960  [facility] CM/SW Contact  Eileen Stanford, LCSW Phone Number: 03/30/2022, 11:43 AM  Clinical Narrative: Pt's Spouse reached out to CSW to inquire about pt going to [facility] instead of [facility]. [facility] has made a bed offer and can take pt. CSW reached out to [place] with [facility] and notified her of facility change and confirmed insurance is still pending.      Expected Discharge Plan: [place] Barriers to Discharge: Continued Medical Work up  Expected Discharge Plan and Services Expected Discharge Plan: [place] arrangements for the past 2 months: Single Family Home                                       Social Determinants of Health (SDOH) Interventions    Readmission Risk Interventions    03/28/2022    1:38 PM  Readmission Risk Prevention Plan  Post Dischage Appt Complete  Medication Screening Complete  Transportation Screening Complete

## 2022-03-30 NOTE — TOC Transition Note (Signed)
Transition of Care [facility]) - CM/SW Discharge Note   Patient Details  Name: RHYLEN PULIDO MRN: [id number] Date of Birth: May 19, 1961  Transition of Care [facility]) CM/SW Contact:  Eileen Stanford, LCSW Phone Number: 03/30/2022, 2:41 PM   Clinical Narrative:   Clinical Social Worker facilitated patient discharge including contacting patient family and facility to confirm patient discharge plans.  Clinical information faxed to facility and family agreeable with plan.  CSW arranged ambulance transport via [facility] to [facility] .  RN to call [phone] for report prior to discharge.     Final next level of care: Skilled Nursing Facility Barriers to Discharge: No Barriers Identified   Patient Goals and CMS Choice Patient states their goals for this hospitalization and ongoing recovery are:: SNF CMS Medicare.gov Compare Post Acute Care list provided to:: Patient Represenative (must comment) Choice offered to / list presented to : Spouse  Discharge Placement              Patient chooses bed at:  [facility]) Patient to be transferred to facility by: [facility] Name of family member notified: Gerald Stabs, Spouse Patient and family notified of of transfer: 03/30/22  Discharge Plan and Services                                     Social Determinants of Health (SDOH) Interventions     Readmission Risk Interventions    03/28/2022    1:38 PM  Readmission Risk Prevention Plan  Post Dischage Appt Complete  Medication Screening Complete  Transportation Screening Complete

## 2022-03-30 NOTE — Discharge Instructions (Signed)
Advised to follow-up with primary care physician in 1 week. Advised to follow-up with orthopedics as scheduled.

## 2022-03-30 NOTE — Plan of Care (Signed)
  Problem: Education: Goal: Ability to describe self-care measures that may prevent or decrease complications (Diabetes Survival Skills Education) will improve Outcome: Progressing Goal: Individualized Educational Video(s) Outcome: Progressing   Problem: Fluid Volume: Goal: Ability to maintain a balanced intake and output will improve Outcome: Progressing   Problem: Health Behavior/Discharge Planning: Goal: Ability to identify and utilize available resources and services will improve Outcome: Progressing   Problem: Metabolic: Goal: Ability to maintain appropriate glucose levels will improve Outcome: Progressing   Problem: Education: Goal: Knowledge of General Education information will improve Description: Including pain rating scale, medication(s)/side effects and non-pharmacologic comfort measures Outcome: Progressing

## 2022-03-31 DIAGNOSIS — S42291D Other displaced fracture of upper end of right humerus, subsequent encounter for fracture with routine healing: Secondary | ICD-10-CM | POA: Diagnosis not present

## 2022-03-31 DIAGNOSIS — R5381 Other malaise: Secondary | ICD-10-CM | POA: Diagnosis not present

## 2022-03-31 DIAGNOSIS — Z79899 Other long term (current) drug therapy: Secondary | ICD-10-CM | POA: Diagnosis not present

## 2022-03-31 DIAGNOSIS — Z9181 History of falling: Secondary | ICD-10-CM | POA: Diagnosis not present

## 2022-03-31 DIAGNOSIS — E0590 Thyrotoxicosis, unspecified without thyrotoxic crisis or storm: Secondary | ICD-10-CM | POA: Diagnosis not present

## 2022-03-31 DIAGNOSIS — E039 Hypothyroidism, unspecified: Secondary | ICD-10-CM | POA: Diagnosis not present

## 2022-03-31 DIAGNOSIS — F319 Bipolar disorder, unspecified: Secondary | ICD-10-CM | POA: Diagnosis not present

## 2022-04-01 DIAGNOSIS — E039 Hypothyroidism, unspecified: Secondary | ICD-10-CM | POA: Diagnosis not present

## 2022-04-01 DIAGNOSIS — F310 Bipolar disorder, current episode hypomanic: Secondary | ICD-10-CM | POA: Diagnosis not present

## 2022-04-01 DIAGNOSIS — M6281 Muscle weakness (generalized): Secondary | ICD-10-CM | POA: Diagnosis not present

## 2022-04-01 DIAGNOSIS — E559 Vitamin D deficiency, unspecified: Secondary | ICD-10-CM | POA: Diagnosis not present

## 2022-04-01 DIAGNOSIS — E119 Type 2 diabetes mellitus without complications: Secondary | ICD-10-CM | POA: Diagnosis not present

## 2022-04-01 DIAGNOSIS — I10 Essential (primary) hypertension: Secondary | ICD-10-CM | POA: Diagnosis not present

## 2022-04-01 DIAGNOSIS — L409 Psoriasis, unspecified: Secondary | ICD-10-CM | POA: Diagnosis not present

## 2022-04-01 DIAGNOSIS — S42291D Other displaced fracture of upper end of right humerus, subsequent encounter for fracture with routine healing: Secondary | ICD-10-CM | POA: Diagnosis not present

## 2022-04-01 DIAGNOSIS — E1122 Type 2 diabetes mellitus with diabetic chronic kidney disease: Secondary | ICD-10-CM | POA: Diagnosis not present

## 2022-04-01 DIAGNOSIS — Z789 Other specified health status: Secondary | ICD-10-CM | POA: Diagnosis not present

## 2022-04-01 DIAGNOSIS — G4700 Insomnia, unspecified: Secondary | ICD-10-CM | POA: Diagnosis not present

## 2022-04-02 DIAGNOSIS — L409 Psoriasis, unspecified: Secondary | ICD-10-CM | POA: Diagnosis not present

## 2022-04-02 DIAGNOSIS — E039 Hypothyroidism, unspecified: Secondary | ICD-10-CM | POA: Diagnosis not present

## 2022-04-02 DIAGNOSIS — E1122 Type 2 diabetes mellitus with diabetic chronic kidney disease: Secondary | ICD-10-CM | POA: Diagnosis not present

## 2022-04-02 DIAGNOSIS — F310 Bipolar disorder, current episode hypomanic: Secondary | ICD-10-CM | POA: Diagnosis not present

## 2022-04-05 DIAGNOSIS — S42291D Other displaced fracture of upper end of right humerus, subsequent encounter for fracture with routine healing: Secondary | ICD-10-CM | POA: Diagnosis not present

## 2022-04-05 DIAGNOSIS — M6281 Muscle weakness (generalized): Secondary | ICD-10-CM | POA: Diagnosis not present

## 2022-04-05 DIAGNOSIS — K5900 Constipation, unspecified: Secondary | ICD-10-CM | POA: Diagnosis not present

## 2022-04-05 DIAGNOSIS — I10 Essential (primary) hypertension: Secondary | ICD-10-CM | POA: Diagnosis not present

## 2022-04-05 DIAGNOSIS — L409 Psoriasis, unspecified: Secondary | ICD-10-CM | POA: Diagnosis not present

## 2022-04-05 DIAGNOSIS — R609 Edema, unspecified: Secondary | ICD-10-CM | POA: Diagnosis not present

## 2022-04-06 DIAGNOSIS — E119 Type 2 diabetes mellitus without complications: Secondary | ICD-10-CM | POA: Diagnosis not present

## 2022-04-06 DIAGNOSIS — E559 Vitamin D deficiency, unspecified: Secondary | ICD-10-CM | POA: Diagnosis not present

## 2022-04-07 DIAGNOSIS — L409 Psoriasis, unspecified: Secondary | ICD-10-CM | POA: Diagnosis not present

## 2022-04-07 DIAGNOSIS — S42291D Other displaced fracture of upper end of right humerus, subsequent encounter for fracture with routine healing: Secondary | ICD-10-CM | POA: Diagnosis not present

## 2022-04-07 DIAGNOSIS — E119 Type 2 diabetes mellitus without complications: Secondary | ICD-10-CM | POA: Diagnosis not present

## 2022-04-08 DIAGNOSIS — S42201A Unspecified fracture of upper end of right humerus, initial encounter for closed fracture: Secondary | ICD-10-CM | POA: Diagnosis not present

## 2022-04-09 DIAGNOSIS — R609 Edema, unspecified: Secondary | ICD-10-CM | POA: Diagnosis not present

## 2022-04-09 DIAGNOSIS — S42291D Other displaced fracture of upper end of right humerus, subsequent encounter for fracture with routine healing: Secondary | ICD-10-CM | POA: Diagnosis not present

## 2022-04-09 DIAGNOSIS — L409 Psoriasis, unspecified: Secondary | ICD-10-CM | POA: Diagnosis not present

## 2022-04-09 DIAGNOSIS — M6281 Muscle weakness (generalized): Secondary | ICD-10-CM | POA: Diagnosis not present

## 2022-04-12 DIAGNOSIS — M25562 Pain in left knee: Secondary | ICD-10-CM | POA: Diagnosis not present

## 2022-04-12 DIAGNOSIS — M25561 Pain in right knee: Secondary | ICD-10-CM | POA: Diagnosis not present

## 2022-04-12 DIAGNOSIS — R609 Edema, unspecified: Secondary | ICD-10-CM | POA: Diagnosis not present

## 2022-04-12 DIAGNOSIS — R12 Heartburn: Secondary | ICD-10-CM | POA: Diagnosis not present

## 2022-04-14 DIAGNOSIS — L409 Psoriasis, unspecified: Secondary | ICD-10-CM | POA: Diagnosis not present

## 2022-04-14 DIAGNOSIS — E1122 Type 2 diabetes mellitus with diabetic chronic kidney disease: Secondary | ICD-10-CM | POA: Diagnosis not present

## 2022-04-14 DIAGNOSIS — M6281 Muscle weakness (generalized): Secondary | ICD-10-CM | POA: Diagnosis not present

## 2022-04-15 DIAGNOSIS — F3132 Bipolar disorder, current episode depressed, moderate: Secondary | ICD-10-CM | POA: Diagnosis not present

## 2022-04-15 DIAGNOSIS — F411 Generalized anxiety disorder: Secondary | ICD-10-CM | POA: Diagnosis not present

## 2022-04-15 DIAGNOSIS — F5101 Primary insomnia: Secondary | ICD-10-CM | POA: Diagnosis not present

## 2022-04-15 DIAGNOSIS — G251 Drug-induced tremor: Secondary | ICD-10-CM | POA: Diagnosis not present

## 2022-04-16 DIAGNOSIS — F310 Bipolar disorder, current episode hypomanic: Secondary | ICD-10-CM | POA: Diagnosis not present

## 2022-04-16 DIAGNOSIS — S42291D Other displaced fracture of upper end of right humerus, subsequent encounter for fracture with routine healing: Secondary | ICD-10-CM | POA: Diagnosis not present

## 2022-04-16 DIAGNOSIS — L409 Psoriasis, unspecified: Secondary | ICD-10-CM | POA: Diagnosis not present

## 2022-04-26 DIAGNOSIS — E1159 Type 2 diabetes mellitus with other circulatory complications: Secondary | ICD-10-CM | POA: Diagnosis not present

## 2022-04-26 DIAGNOSIS — S42201A Unspecified fracture of upper end of right humerus, initial encounter for closed fracture: Secondary | ICD-10-CM | POA: Diagnosis not present

## 2022-04-26 DIAGNOSIS — D638 Anemia in other chronic diseases classified elsewhere: Secondary | ICD-10-CM | POA: Diagnosis not present

## 2022-04-26 DIAGNOSIS — Z09 Encounter for follow-up examination after completed treatment for conditions other than malignant neoplasm: Secondary | ICD-10-CM | POA: Diagnosis not present

## 2022-04-26 DIAGNOSIS — F319 Bipolar disorder, unspecified: Secondary | ICD-10-CM | POA: Diagnosis not present

## 2022-04-27 DIAGNOSIS — F250 Schizoaffective disorder, bipolar type: Secondary | ICD-10-CM | POA: Diagnosis not present

## 2022-04-29 DIAGNOSIS — S42201A Unspecified fracture of upper end of right humerus, initial encounter for closed fracture: Secondary | ICD-10-CM | POA: Diagnosis not present

## 2022-05-10 ENCOUNTER — Ambulatory Visit: Payer: PPO | Admitting: Physical Therapy

## 2022-05-18 ENCOUNTER — Ambulatory Visit: Payer: PPO | Attending: Family Medicine | Admitting: Physical Therapy

## 2022-05-18 ENCOUNTER — Encounter: Payer: Self-pay | Admitting: Physical Therapy

## 2022-05-18 DIAGNOSIS — M25611 Stiffness of right shoulder, not elsewhere classified: Secondary | ICD-10-CM | POA: Insufficient documentation

## 2022-05-18 DIAGNOSIS — M25511 Pain in right shoulder: Secondary | ICD-10-CM | POA: Insufficient documentation

## 2022-05-18 DIAGNOSIS — R2689 Other abnormalities of gait and mobility: Secondary | ICD-10-CM | POA: Insufficient documentation

## 2022-05-18 DIAGNOSIS — M6281 Muscle weakness (generalized): Secondary | ICD-10-CM | POA: Insufficient documentation

## 2022-05-18 DIAGNOSIS — R2681 Unsteadiness on feet: Secondary | ICD-10-CM | POA: Diagnosis not present

## 2022-05-18 DIAGNOSIS — R262 Difficulty in walking, not elsewhere classified: Secondary | ICD-10-CM | POA: Insufficient documentation

## 2022-05-18 DIAGNOSIS — R278 Other lack of coordination: Secondary | ICD-10-CM | POA: Insufficient documentation

## 2022-05-18 NOTE — Therapy (Signed)
OUTPATIENT PHYSICAL THERAPY SHOULDER EVALUATION   Patient Name: Stacey Ball MRN: [id number] DOB:February 14, 1961, 61 y.o., female Today's Date: 05/18/2022   PT End of Session - 05/18/22 1107     Visit Number 1    Number of Visits 24    Date for PT Re-Evaluation 08/10/22    PT Start Time 1100    PT Stop Time 1145    PT Time Calculation (min) 45 min    Behavior During Therapy [facility] for tasks assessed/performed   Verbose but pleasant            Past Medical History:  Diagnosis Date   Anemia    HX of   Arthritis    Bipolar 1 disorder ([place])    Cancer ([place])    skin cancer   Cataracts, bilateral    Diabetes mellitus without complication ([place])    Dyspnea    on exertion (Pt states, "out of shape")   Function kidney decreased    High cholesterol    Hypertension    Hypothyroidism    Psoriasis    Psoriatic arthritis ([place])    Thyroid disease    Past Surgical History:  Procedure Laterality Date   CATARACT EXTRACTION W/PHACO Left 01/10/2020   Procedure: CATARACT EXTRACTION PHACO AND INTRAOCULAR LENS PLACEMENT ([place]) LEFT DIABETIC;  Surgeon: Marchia Meiers, MD;  Location: [place];  Service: Ophthalmology;  Laterality: Left;  5.32 0:41.5   CATARACT EXTRACTION W/PHACO Right 02/07/2020   Procedure: CATARACT EXTRACTION PHACO AND INTRAOCULAR LENS PLACEMENT ([place]) RIGHT DIABETIC VISION BLUE 4.01 00:27.4;  Surgeon: Marchia Meiers, MD;  Location: [place];  Service: Ophthalmology;  Laterality: Right;  Diabetic - oral meds   COLONOSCOPY WITH PROPOFOL N/A 03/04/2016   Procedure: COLONOSCOPY WITH PROPOFOL;  Surgeon: Manya Silvas, MD;  Location: [facility] ENDOSCOPY;  Service: Endoscopy;  Laterality: N/A;   great toe amputation     matrixectomy     TONSILLECTOMY     Patient Active Problem List   Diagnosis Date Noted   Hypothyroidism 03/26/2022   UTI (urinary tract infection) 03/26/2022   Pneumonia due to COVID-19 virus 10/22/2020   Bipolar disorder ([place]) 10/22/2020   Acute  encephalopathy 10/22/2020   Benign hypertensive kidney disease with chronic kidney disease 07/11/2019   Secondary hyperparathyroidism of renal origin ([place]) 07/11/2019   Class 1 obesity due to excess calories with serious comorbidity and body mass index (BMI) of 32.0 to 32.9 in adult 05/09/2019   Type 2 diabetes mellitus with stage 3 chronic kidney disease, without long-term current use of insulin ([place]) 09/16/2017   Stage 3 chronic kidney disease ([place]) 10/04/2016   Essential hypertriglyceridemia 03/22/2016   Essential hypertension 03/17/2016    PCP: Dion Body, MD  REFERRING PROVIDER: Mortimer Fries, PA-C  REFERRING DIAG: [phone] (ICD-10-CM) - Unspecified fracture of upper end of right humerus, initial encounter for closed fracture  THERAPY DIAG:  Stiffness of right shoulder, not elsewhere classified  Muscle weakness (generalized)  Right shoulder pain, unspecified chronicity  Rationale for Evaluation and Treatment Rehabilitation  ONSET DATE: 03/25/22  SUBJECTIVE:  SUBJECTIVE STATEMENT: Pt she had a fall in early June that resulted in a right humeral fracture. Pt has significant support from her husband and caregiver but does report sometimes his care is somewhat overwhelming and does not allow her to be independent with things.  Patient reports her balance and mobility is as big a concern for her as her shoulder is.  Patient states she is interested in receiving occupational therapy and physical therapy to further improve her progress timeline.  Patient has had multiple falls in the last 6 months with latest resulting in this fracture.  PERTINENT HISTORY: Stacey Ball is a 61 y.o. here for an acute issue. She has a PMH of HTN, hypothyroidism, DM2, bipolar, CKD, HLD, Covid January 2022. She suffered  a recent fall on 03/25/2022 landing on her right shoulder suffering a humerus fracture. She was hospitalized on 03/26/2022. She was transferred to rehab facility, [facility]. Now has returned home.   PAIN:  Are you having pain? No  PRECAUTIONS: Shoulder no reaching, lifting,   WEIGHT BEARING RESTRICTIONS No  FALLS:  Has patient fallen in last 6 months? Yes. Number of falls 4 Once at [facility], once at night fall with walker, twice when alone at home.  LIVING ENVIRONMENT: Lives with: lives with their family and lives with their spouse Lives in: House/apartment Stairs: No Has following equipment at home: Quad cane small base, walker ( can't use due to shoulder) , shower chair, elevated toilet chair.   OCCUPATION: Retired, used to work part time ( 5 years ago)   PLOF: Independent with basic ADLs, Independent with household mobility with device, Independent with community mobility with device, Independent with homemaking with ambulation, Independent with gait, and pt reports her husband started doing all the chores which enabled her to become inactive   PATIENT GOALS Improve her shoulder function and improve her balance and mobility   OBJECTIVE:   DIAGNOSTIC FINDINGS:  There is a markedly comminuted and moderately displaced fracture of the right humeral surgical neck. On frontal view there appears to be approximately 1.5 cm fracture line craniocaudal diastasis. There is a cortical fragment measuring up to 2 cm that appears to be displaced medially 8 mm.   There is minimal varus angulation of the fracture. On transscapular Y-view there appears to be anterior angulation of the fracture and anterior displacement of the distal fracture component with respect to the proximal fracture component. There appears to be volume loss from a cortical fracture of the superolateral humeral head greater tuberosity.   The humeral head remains appropriately located with respect of  the glenoid.   Mild acromioclavicular joint space narrowing and peripheral osteophytosis.   IMPRESSION: 1. Markedly comminuted and moderately displaced fracture of the right humeral surgical neck. 2. Likely additional comminuted greater tuberosity of the humeral head fracture.  PATIENT SURVEYS:  FOTO 27  COGNITION:  Overall cognitive status: Within functional limits for tasks assessed     SENSATION: Not tested  POSTURE: Slumped posture in chair. Rounded shoulders   UPPER EXTREMITY ROM:   Passive ROM Right eval Left eval  Shoulder flexion 45 WNL  Shoulder extension    Shoulder abduction 45 WNL  Shoulder adduction    Shoulder internal rotation 15 WNL  Shoulder external rotation 0 WNL  Elbow flexion    Elbow extension    Wrist flexion    Wrist extension    Wrist ulnar deviation    Wrist radial deviation    Wrist pronation    Wrist  supination    (Blank rows = not tested) -significant UT involvement with testing R shoulder flexion   UPPER EXTREMITY MMT:  Unable to assess due to restriction for active motion  MMT Right eval Left eval  Shoulder flexion  WNL  Shoulder extension    Shoulder abduction  WNL  Shoulder adduction    Shoulder internal rotation  WNL  Shoulder external rotation  WNL  Middle trapezius    Lower trapezius    Elbow flexion    Elbow extension    Wrist flexion    Wrist extension    Wrist ulnar deviation    Wrist radial deviation    Wrist pronation    Wrist supination    Grip strength (lbs)    (Blank rows = not tested)   JOINT MOBILITY TESTING:  Not tested      TODAY'S TREATMENT:  Eval Only    PATIENT EDUCATION: Education details: POC regarding  Person educated: Patient Education method: Explanation Education comprehension: verbalized understanding   HOME EXERCISE PROGRAM: To begin next session  ASSESSMENT:  CLINICAL IMPRESSION: Patient is a 61 y.o. female who was seen today for physical therapy evaluation and  treatment for proximal right humeral fracture. Pt presents with limitations in Right shoulder function and significant limitations in functional ability to complete tasks in her home including home making and chores.  Unable to assess strength formally today due to restrictions to "gentle passive motions" and pendulum exercises right now. Pt to be assessed for balance and LE strength next session as she complains of significant functional impairments and frequent falls due to this. Pt is to be referred to OT a ththis clinic for continued treatment of her right shoulder fracture and to be treated concurrently with LE strength and balance in PT. This will begin following OT referral and eval. Pt will beenfit from PT interventions to improve her strength, improve her QOL and improve her overall function.    OBJECTIVE IMPAIRMENTS decreased activity tolerance, decreased ROM, decreased strength, hypomobility, impaired flexibility, impaired UE functional use, and pain.   ACTIVITY LIMITATIONS carrying, lifting, bathing, dressing, and self feeding  PARTICIPATION LIMITATIONS: meal prep, cleaning, laundry, and community activity  PERSONAL FACTORS 3+ comorbidities: HTN, DM, HLD   are also affecting patient's functional outcome.   REHAB POTENTIAL: Good  CLINICAL DECISION MAKING: Stable/uncomplicated  EVALUATION COMPLEXITY: Low   GOALS: Goals reviewed with patient? Yes  SHORT TERM GOALS: Target date: 06/15/2022   Patient will be independent in home exercise program to improve strength/mobility for better functional independence with ADLs. Baseline: No HEP currently  Goal status: INITIAL    LONG TERM GOALS: Target date:  08/10/2022 1.  Patient (> 70 years old) will complete five times sit to stand test in < 15 seconds indicating an increased LE strength and improved balance. Baseline: To test visit 2  Goal status: INITIAL  2.  Patient will increase FOTO score to equal to or greater than  57   to  demonstrate statistically significant improvement in mobility and quality of life.  Baseline: 27 Goal status: INITIAL   3.  Patient will increase Berg Balance score by > 6 points to demonstrate decreased fall risk during functional activities. Baseline: To test visit 2  Goal status: INITIAL   4.   Patient will reduce timed up and go to <11 seconds to reduce fall risk and demonstrate improved transfer/gait ability. Baseline: To test visit 2  Goal status: INITIAL  5.   Patient will improve R  shoulder PROM to 90 degrees flexion and abduction and 30 degrees IR and IR in order to progress to functional shoulder use as tissues continue to heal  Baseline: see eval  Goal status: INITIAL       PLAN: PT FREQUENCY: 2x/week  PT DURATION: 12 weeks  PLANNED INTERVENTIONS: Therapeutic exercises, Therapeutic activity, Neuromuscular re-education, Balance training, Gait training, Patient/Family education, Self Care, Joint mobilization, Aquatic Therapy, Dry Needling, Electrical stimulation, Moist heat, Taping, Ultrasound, and Manual therapy  PLAN FOR NEXT SESSION: Initial HEP   Particia Lather, PT 05/18/2022, 2:08 PM

## 2022-05-20 ENCOUNTER — Ambulatory Visit: Payer: PPO

## 2022-05-20 DIAGNOSIS — R2681 Unsteadiness on feet: Secondary | ICD-10-CM

## 2022-05-20 DIAGNOSIS — M6281 Muscle weakness (generalized): Secondary | ICD-10-CM

## 2022-05-20 DIAGNOSIS — R2689 Other abnormalities of gait and mobility: Secondary | ICD-10-CM

## 2022-05-20 DIAGNOSIS — M25511 Pain in right shoulder: Secondary | ICD-10-CM

## 2022-05-20 DIAGNOSIS — M25611 Stiffness of right shoulder, not elsewhere classified: Secondary | ICD-10-CM | POA: Diagnosis not present

## 2022-05-20 NOTE — Therapy (Signed)
OUTPATIENT PHYSICAL THERAPY TREATMENT   Patient Name: Stacey Ball MRN: [id number] DOB:06-22-1961, 61 y.o., female Today's Date: 05/20/2022   PT End of Session - 05/20/22 1448     Visit Number 2    Number of Visits 24    Date for PT Re-Evaluation 08/10/22    PT Start Time 1153    PT Stop Time 1230    PT Time Calculation (min) 37 min    Equipment Utilized During Treatment Gait belt    Activity Tolerance Patient tolerated treatment well;No increased pain    Behavior During Therapy WFL for tasks assessed/performed   Verbose but pleasant             Past Medical History:  Diagnosis Date   Anemia    HX of   Arthritis    Bipolar 1 disorder ([place])    Cancer ([facility])    skin cancer   Cataracts, bilateral    Diabetes mellitus without complication ([facility])    Dyspnea    on exertion (Pt states, "out of shape")   Function kidney decreased    High cholesterol    Hypertension    Hypothyroidism    Psoriasis    Psoriatic arthritis ([place])    Thyroid disease    Past Surgical History:  Procedure Laterality Date   CATARACT EXTRACTION W/PHACO Left 01/10/2020   Procedure: CATARACT EXTRACTION PHACO AND INTRAOCULAR LENS PLACEMENT ([place]) LEFT DIABETIC;  Surgeon: Marchia Meiers, MD;  Location: [place];  Service: Ophthalmology;  Laterality: Left;  5.32 0:41.5   CATARACT EXTRACTION W/PHACO Right 02/07/2020   Procedure: CATARACT EXTRACTION PHACO AND INTRAOCULAR LENS PLACEMENT ([place]) RIGHT DIABETIC VISION BLUE 4.01 00:27.4;  Surgeon: Marchia Meiers, MD;  Location: [place];  Service: Ophthalmology;  Laterality: Right;  Diabetic - oral meds   COLONOSCOPY WITH PROPOFOL N/A 03/04/2016   Procedure: COLONOSCOPY WITH PROPOFOL;  Surgeon: Manya Silvas, MD;  Location: [facility] ENDOSCOPY;  Service: Endoscopy;  Laterality: N/A;   great toe amputation     matrixectomy     TONSILLECTOMY     Patient Active Problem List   Diagnosis Date Noted   Hypothyroidism 03/26/2022   UTI (urinary tract  infection) 03/26/2022   Pneumonia due to COVID-19 virus 10/22/2020   Bipolar disorder ([place]) 10/22/2020   Acute encephalopathy 10/22/2020   Benign hypertensive kidney disease with chronic kidney disease 07/11/2019   Secondary hyperparathyroidism of renal origin ([place]) 07/11/2019   Class 1 obesity due to excess calories with serious comorbidity and body mass index (BMI) of 32.0 to 32.9 in adult 05/09/2019   Type 2 diabetes mellitus with stage 3 chronic kidney disease, without long-term current use of insulin ([place]) 09/16/2017   Stage 3 chronic kidney disease ([place]) 10/04/2016   Essential hypertriglyceridemia 03/22/2016   Essential hypertension 03/17/2016    PCP: Dion Body, MD  REFERRING PROVIDER: Mortimer Fries, PA-C  REFERRING DIAG: [phone] (ICD-10-CM) - Unspecified fracture of upper end of right humerus, initial encounter for closed fracture  THERAPY DIAG:  Muscle weakness (generalized)  Other abnormalities of gait and mobility  Unsteadiness on feet  Right shoulder pain, unspecified chronicity  Rationale for Evaluation and Treatment Rehabilitation  ONSET DATE: 03/25/22  SUBJECTIVE:  SUBJECTIVE STATEMENT: Pt reports current pain level is about a 5/10. Reports not agonizing. Pt feels she may have slept on her arm wrong. Pt reports no recent stumbles/falls. Feels like she has lost some skill with using the [place]. Reports FOF.  PAIN:  Are you having pain? Yes, 5/10 in R UE  PERTINENT HISTORY: Stacey Ball is a 61 y.o. here for an acute issue. She has a PMH of HTN, hypothyroidism, DM2, bipolar, CKD, HLD, Covid January 2022. She suffered a recent fall on 03/25/2022 landing on her right shoulder suffering a humerus fracture. She was hospitalized on 03/26/2022. She was transferred to rehab facility,  [facility]. Now has returned home.     PRECAUTIONS: Shoulder no reaching, lifting,   WEIGHT BEARING RESTRICTIONS No  FALLS:  Has patient fallen in last 6 months? Yes. Number of falls 4 Once at [facility], once at night fall with walker, twice when alone at home.  LIVING ENVIRONMENT: Lives with: lives with their family and lives with their spouse Lives in: House/apartment Stairs: No Has following equipment at home: Quad cane small base, walker ( can't use due to shoulder) , shower chair, elevated toilet chair.   OCCUPATION: Retired, used to work part time ( 5 years ago)   PLOF: Independent with basic ADLs, Independent with household mobility with device, Independent with community mobility with device, Independent with homemaking with ambulation, Independent with gait, and pt reports her husband started doing all the chores which enabled her to become inactive   PATIENT GOALS Improve her shoulder function and improve her balance and mobility   OBJECTIVE:  All findings in objective taken at eval unless otherwise specified   DIAGNOSTIC FINDINGS:  There is a markedly comminuted and moderately displaced fracture of the right humeral surgical neck. On frontal view there appears to be approximately 1.5 cm fracture line craniocaudal diastasis. There is a cortical fragment measuring up to 2 cm that appears to be displaced medially 8 mm.   There is minimal varus angulation of the fracture. On transscapular Y-view there appears to be anterior angulation of the fracture and anterior displacement of the distal fracture component with respect to the proximal fracture component. There appears to be volume loss from a cortical fracture of the superolateral humeral head greater tuberosity.   The humeral head remains appropriately located with respect of the glenoid.   Mild acromioclavicular joint space narrowing and peripheral osteophytosis.   IMPRESSION: 1. Markedly comminuted  and moderately displaced fracture of the right humeral surgical neck. 2. Likely additional comminuted greater tuberosity of the humeral head fracture.   UPPER EXTREMITY ROM:   Passive ROM Right eval Left eval  Shoulder flexion 45 WNL  Shoulder extension    Shoulder abduction 45 WNL  Shoulder adduction    Shoulder internal rotation 15 WNL  Shoulder external rotation 0 WNL  Elbow flexion    Elbow extension    Wrist flexion    Wrist extension    Wrist ulnar deviation    Wrist radial deviation    Wrist pronation    Wrist supination    (Blank rows = not tested) -significant UT involvement with testing R shoulder flexion   UPPER EXTREMITY MMT:  Unable to assess due to restriction for active motion  MMT Right eval Left eval  Shoulder flexion  WNL  Shoulder extension    Shoulder abduction  WNL  Shoulder adduction    Shoulder internal rotation  WNL  Shoulder external rotation  WNL  (Blank rows =  not tested)   JOINT MOBILITY TESTING:  Not tested    TODAY'S TREATMENT:   05/20/2022  Further testing completed to assess balance, LE strength: Instruction provided throughout in correct technique with testing, and testing indications.  TUG: 75 sec with SPC BERG: deferred MMT LE: Grossly 4-/5 BLE 5xSTS: 27 sec with LUE support off chair  Reviewed the following for pt's HEP Seated DF 20x each LE Seated PF 20x each LE  Seated marches 20x each LE Seated LAQ 20x each LE Provided printout: Access Code: [email or web address] URL: [email or web address] Date: 05/20/2022 Prepared by: Ricard Dillon  Exercises - Seated Toe Raise  - 1 x daily - 5 x weekly - 2 sets - 20 reps - 2 seconds hold - Seated Heel Raise  - 1 x daily - 5 x weekly - 2 sets - 20 reps - 2 seconds hold - Seated Long Arc Quad  - 1 x daily - 5 x weekly - 2 sets - 20 reps - 2 seconds hold - Seated March  - 1 x daily - 5 x weekly - 2 sets - 20 reps - 2 seconds hold   PATIENT EDUCATION: Education details:  further testing, indications, goals, plan and HEP Person educated: Patient Education method: Explanation, Demonstration, Tactile cues, Verbal cues, and Handouts Education comprehension: verbalized understanding, returned demonstration, verbal cues required, tactile cues required, and needs further education   HOME EXERCISE PROGRAM: 05/20/2022: Access Code: [email or web address] URL: [email or web address] Date: 05/20/2022 Prepared by: Ricard Dillon  Exercises - Seated Toe Raise  - 1 x daily - 5 x weekly - 2 sets - 20 reps - 2 seconds hold - Seated Heel Raise  - 1 x daily - 5 x weekly - 2 sets - 20 reps - 2 seconds hold - Seated Long Arc Quad  - 1 x daily - 5 x weekly - 2 sets - 20 reps - 2 seconds hold - Seated March  - 1 x daily - 5 x weekly - 2 sets - 20 reps - 2 seconds hold    ASSESSMENT:  CLINICAL IMPRESSION: Session somewhat limited secondary to pt late arrival. However, pt pleasant and motivated to participate in PT. Further testing completed on this date. LE MMT indicates significant strength impairment throughout BLE. Pt is at a high risk for future falls AEB TUG and 5xSTS scores. PT issued HEP on this date to address deficits and went through its exercises in session with patient to confirm her understanding. Will continue to review HEP as appropriate. The pt will benefit  from PT interventions to improve her strength, balance, and to improve her QOL and function.    OBJECTIVE IMPAIRMENTS decreased activity tolerance, decreased ROM, decreased strength, hypomobility, impaired flexibility, impaired UE functional use, and pain.   ACTIVITY LIMITATIONS carrying, lifting, bathing, dressing, and self feeding  PARTICIPATION LIMITATIONS: meal prep, cleaning, laundry, and community activity  PERSONAL FACTORS 3+ comorbidities: HTN, DM, HLD   are also affecting patient's functional outcome.   REHAB POTENTIAL: Good  CLINICAL DECISION MAKING: Stable/uncomplicated  EVALUATION COMPLEXITY:  Low   GOALS: Goals reviewed with patient? Yes  SHORT TERM GOALS: Target date: 06/15/2022   Patient will be independent in home exercise program to improve strength/mobility for better functional independence with ADLs. Baseline: No HEP currently  Goal status: INITIAL    LONG TERM GOALS: Target date:  08/10/2022 1.  Patient (> 78 years old) will complete five times sit to stand test in < 15 seconds indicating an increased LE strength and improved  balance. Baseline: 8/3: 27 sec  Goal status: INITIAL  2.  Patient will increase FOTO score to equal to or greater than  57   to demonstrate statistically significant improvement in mobility and quality of life.  Baseline: 27 Goal status: INITIAL   3.  Patient will increase Berg Balance score by > 6 points to demonstrate decreased fall risk during functional activities. Baseline: deferred  Goal status: INITIAL   4.   Patient will reduce timed up and go to <11 seconds to reduce fall risk and demonstrate improved transfer/gait ability. Baseline: 8/3: 75 seconds with SPC  Goal status: INITIAL  5.   Patient will improve R shoulder PROM to 90 degrees flexion and abduction and 30 degrees IR and IR in order to progress to functional shoulder use as tissues continue to heal  Baseline: see eval  Goal status: INITIAL   PLAN: PT FREQUENCY: 2x/week  PT DURATION: 12 weeks  PLANNED INTERVENTIONS: Therapeutic exercises, Therapeutic activity, Neuromuscular re-education, Balance training, Gait training, Patient/Family education, Self Care, Joint mobilization, Aquatic Therapy, Dry Needling, Electrical stimulation, Moist heat, Taping, Ultrasound, and Manual therapy  PLAN FOR NEXT SESSION: Review HEP, complete BERG as able, LE strengthening, gait, balance   Zollie Pee, PT 05/20/2022, 2:50 PM

## 2022-05-25 ENCOUNTER — Ambulatory Visit: Payer: PPO

## 2022-05-25 DIAGNOSIS — M6281 Muscle weakness (generalized): Secondary | ICD-10-CM

## 2022-05-25 DIAGNOSIS — R2681 Unsteadiness on feet: Secondary | ICD-10-CM

## 2022-05-25 DIAGNOSIS — M25611 Stiffness of right shoulder, not elsewhere classified: Secondary | ICD-10-CM

## 2022-05-25 DIAGNOSIS — M25511 Pain in right shoulder: Secondary | ICD-10-CM

## 2022-05-25 NOTE — Therapy (Signed)
OUTPATIENT PHYSICAL THERAPY TREATMENT   Patient Name: Stacey Ball MRN: [id number] DOB:1961-01-15, 62 y.o., female Today's Date: 05/25/2022   PT End of Session - 05/25/22 0927     Visit Number 3    Number of Visits 24    Date for PT Re-Evaluation 08/10/22    PT Start Time 0851    PT Stop Time 0931    PT Time Calculation (min) 40 min    Equipment Utilized During Treatment Gait belt    Activity Tolerance Patient tolerated treatment well;No increased pain    Behavior During Therapy WFL for tasks assessed/performed   Verbose but pleasant              Past Medical History:  Diagnosis Date   Anemia    HX of   Arthritis    Bipolar 1 disorder ([place])    Cancer (HCC)    skin cancer   Cataracts, bilateral    Diabetes mellitus without complication (HCC)    Dyspnea    on exertion (Pt states, "out of shape")   Function kidney decreased    High cholesterol    Hypertension    Hypothyroidism    Psoriasis    Psoriatic arthritis ([place])    Thyroid disease    Past Surgical History:  Procedure Laterality Date   CATARACT EXTRACTION W/PHACO Left 01/10/2020   Procedure: CATARACT EXTRACTION PHACO AND INTRAOCULAR LENS PLACEMENT ([place]) LEFT DIABETIC;  Surgeon: Marchia Meiers, MD;  Location: [facility];  Service: Ophthalmology;  Laterality: Left;  5.32 0:41.5   CATARACT EXTRACTION W/PHACO Right 02/07/2020   Procedure: CATARACT EXTRACTION PHACO AND INTRAOCULAR LENS PLACEMENT ([place]) RIGHT DIABETIC VISION BLUE 4.01 00:27.4;  Surgeon: Marchia Meiers, MD;  Location: [facility];  Service: Ophthalmology;  Laterality: Right;  Diabetic - oral meds   COLONOSCOPY WITH PROPOFOL N/A 03/04/2016   Procedure: COLONOSCOPY WITH PROPOFOL;  Surgeon: Manya Silvas, MD;  Location: [facility] ENDOSCOPY;  Service: Endoscopy;  Laterality: N/A;   great toe amputation     matrixectomy     TONSILLECTOMY     Patient Active Problem List   Diagnosis Date Noted   Hypothyroidism 03/26/2022   UTI (urinary  tract infection) 03/26/2022   Pneumonia due to COVID-19 virus 10/22/2020   Bipolar disorder ([place]) 10/22/2020   Acute encephalopathy 10/22/2020   Benign hypertensive kidney disease with chronic kidney disease 07/11/2019   Secondary hyperparathyroidism of renal origin ([place]) 07/11/2019   Class 1 obesity due to excess calories with serious comorbidity and body mass index (BMI) of 32.0 to 32.9 in adult 05/09/2019   Type 2 diabetes mellitus with stage 3 chronic kidney disease, without long-term current use of insulin ([place]) 09/16/2017   Stage 3 chronic kidney disease ([place]) 10/04/2016   Essential hypertriglyceridemia 03/22/2016   Essential hypertension 03/17/2016    PCP: Dion Body, MD  REFERRING PROVIDER: Mortimer Fries, PA-C  REFERRING DIAG: [phone] (ICD-10-CM) - Unspecified fracture of upper end of right humerus, initial encounter for closed fracture  THERAPY DIAG:  Muscle weakness (generalized)  Unsteadiness on feet  Right shoulder pain, unspecified chronicity  Stiffness of right shoulder, not elsewhere classified  Rationale for Evaluation and Treatment Rehabilitation  ONSET DATE: 03/25/22  SUBJECTIVE:  SUBJECTIVE STATEMENT: Pt reports she has been performing pendulums at home and exercises she was given following her surgery. She is somewhat uncertain about how to perform her HEP.  PAIN:  Are you having pain? Yes, 5/10 in R UE  PERTINENT HISTORY: Stacey Ball is a 61 y.o. here for an acute issue. She has a PMH of HTN, hypothyroidism, DM2, bipolar, CKD, HLD, Covid January 2022. She suffered a recent fall on 03/25/2022 landing on her right shoulder suffering a humerus fracture. She was hospitalized on 03/26/2022. She was transferred to rehab facility, [facility]. Now has returned home.      PRECAUTIONS: Shoulder no reaching, lifting,   WEIGHT BEARING RESTRICTIONS No  FALLS:  Has patient fallen in last 6 months? Yes. Number of falls 4 Once at [facility], once at night fall with walker, twice when alone at home.  LIVING ENVIRONMENT: Lives with: lives with their family and lives with their spouse Lives in: House/apartment Stairs: No Has following equipment at home: Quad cane small base, walker ( can't use due to shoulder) , shower chair, elevated toilet chair.   OCCUPATION: Retired, used to work part time ( 5 years ago)   PLOF: Independent with basic ADLs, Independent with household mobility with device, Independent with community mobility with device, Independent with homemaking with ambulation, Independent with gait, and pt reports her husband started doing all the chores which enabled her to become inactive   PATIENT GOALS Improve her shoulder function and improve her balance and mobility   OBJECTIVE:  All findings in objective taken at eval unless otherwise specified   DIAGNOSTIC FINDINGS:  There is a markedly comminuted and moderately displaced fracture of the right humeral surgical neck. On frontal view there appears to be approximately 1.5 cm fracture line craniocaudal diastasis. There is a cortical fragment measuring up to 2 cm that appears to be displaced medially 8 mm.   There is minimal varus angulation of the fracture. On transscapular Y-view there appears to be anterior angulation of the fracture and anterior displacement of the distal fracture component with respect to the proximal fracture component. There appears to be volume loss from a cortical fracture of the superolateral humeral head greater tuberosity.   The humeral head remains appropriately located with respect of the glenoid.   Mild acromioclavicular joint space narrowing and peripheral osteophytosis.   IMPRESSION: 1. Markedly comminuted and moderately displaced fracture of  the right humeral surgical neck. 2. Likely additional comminuted greater tuberosity of the humeral head fracture.   UPPER EXTREMITY ROM:   Passive ROM Right eval Left eval  Shoulder flexion 45 WNL  Shoulder extension    Shoulder abduction 45 WNL  Shoulder adduction    Shoulder internal rotation 15 WNL  Shoulder external rotation 0 WNL  Elbow flexion    Elbow extension    Wrist flexion    Wrist extension    Wrist ulnar deviation    Wrist radial deviation    Wrist pronation    Wrist supination    (Blank rows = not tested) -significant UT involvement with testing R shoulder flexion   UPPER EXTREMITY MMT:  Unable to assess due to restriction for active motion  MMT Right eval Left eval  Shoulder flexion  WNL  Shoulder extension    Shoulder abduction  WNL  Shoulder adduction    Shoulder internal rotation  WNL  Shoulder external rotation  WNL  (Blank rows = not tested)   JOINT MOBILITY TESTING:  Not tested  TODAY'S TREATMENT:   05/25/2022  Beginning of session in part dedicated to explaining plan pending pt referral to OT for her shoulder.  NMR: BERG: 28/56   TherEx: Seated DF 20x each LE with 2 sec holds  Performed second set with 2.5# weights donned each LE  Seated PF 2x20 with 2.5# weights each LE with 2 sec holds. Performs through decreased ROM   Seated marches 15x 2 sets with 2.5# weights donned each LE  Seated LAQ 15x 2 sets each LE with 2.5# weights donned  Comments: Pt exhibits reduced eccentric control with addition of weights, and greater muscular fatigue.   PATIENT EDUCATION: Education details: further testing, indications, goals, HEP review, exercise technique Person educated: Patient Education method: Explanation, Demonstration, Tactile cues, and Verbal cues Education comprehension: verbalized understanding, returned demonstration, verbal cues required, tactile cues required, and needs further education   HOME EXERCISE PROGRAM:   No  updates today, pt to continue HEP as previously given   05/20/2022: Access Code: [email or web address] URL: [email or web address] Date: 05/20/2022 Prepared by: Ricard Dillon  Exercises - Seated Toe Raise  - 1 x daily - 5 x weekly - 2 sets - 20 reps - 2 seconds hold - Seated Heel Raise  - 1 x daily - 5 x weekly - 2 sets - 20 reps - 2 seconds hold - Seated Long Arc Quad  - 1 x daily - 5 x weekly - 2 sets - 20 reps - 2 seconds hold - Seated March  - 1 x daily - 5 x weekly - 2 sets - 20 reps - 2 seconds hold    ASSESSMENT:  CLINICAL IMPRESSION: Completed BERG on this date. Score of 28/56, indicates fall risk. Reviewed HEP as pt reported confusion with exercises. Will likely have to reinforce in future sessions. Pt was able to progress level of resistance used with therex, but exhibited increased fatigue, and decreased eccentric control. Pt required cues throughout to attend to task. The pt will benefit  from PT interventions to improve her strength, balance, and to improve her QOL and function.    OBJECTIVE IMPAIRMENTS decreased activity tolerance, decreased ROM, decreased strength, hypomobility, impaired flexibility, impaired UE functional use, and pain.   ACTIVITY LIMITATIONS carrying, lifting, bathing, dressing, and self feeding  PARTICIPATION LIMITATIONS: meal prep, cleaning, laundry, and community activity  PERSONAL FACTORS 3+ comorbidities: HTN, DM, HLD   are also affecting patient's functional outcome.   REHAB POTENTIAL: Good  CLINICAL DECISION MAKING: Stable/uncomplicated  EVALUATION COMPLEXITY: Low   GOALS: Goals reviewed with patient? Yes  SHORT TERM GOALS: Target date: 06/15/2022   Patient will be independent in home exercise program to improve strength/mobility for better functional independence with ADLs. Baseline: No HEP currently  Goal status: INITIAL    LONG TERM GOALS: Target date:  08/10/2022 1.  Patient (> 37 years old) will complete five times sit to stand  test in < 15 seconds indicating an increased LE strength and improved balance. Baseline: 8/3: 27 sec  Goal status: INITIAL  2.  Patient will increase FOTO score to equal to or greater than  57   to demonstrate statistically significant improvement in mobility and quality of life.  Baseline: 27 Goal status: INITIAL   3.  Patient will increase Berg Balance score by > 6 points to demonstrate decreased fall risk during functional activities. Baseline: deferred  Goal status: INITIAL   4.   Patient will reduce timed up and go to <11 seconds to reduce fall risk and demonstrate improved  transfer/gait ability. Baseline: 8/3: 75 seconds with SPC  Goal status: INITIAL  5.   Patient will improve R shoulder PROM to 90 degrees flexion and abduction and 30 degrees IR and IR in order to progress to functional shoulder use as tissues continue to heal  Baseline: see eval  Goal status: INITIAL   PLAN: PT FREQUENCY: 2x/week  PT DURATION: 12 weeks  PLANNED INTERVENTIONS: Therapeutic exercises, Therapeutic activity, Neuromuscular re-education, Balance training, Gait training, Patient/Family education, Self Care, Joint mobilization, Aquatic Therapy, Dry Needling, Electrical stimulation, Moist heat, Taping, Ultrasound, and Manual therapy  PLAN FOR NEXT SESSION: Review HEP, LE strengthening, gait, balance, address shoulder as able   Zollie Pee, PT 05/25/2022, 9:58 AM

## 2022-05-27 ENCOUNTER — Ambulatory Visit: Payer: PPO

## 2022-05-27 DIAGNOSIS — M6281 Muscle weakness (generalized): Secondary | ICD-10-CM

## 2022-05-27 DIAGNOSIS — R262 Difficulty in walking, not elsewhere classified: Secondary | ICD-10-CM

## 2022-05-27 DIAGNOSIS — M25611 Stiffness of right shoulder, not elsewhere classified: Secondary | ICD-10-CM | POA: Diagnosis not present

## 2022-05-27 DIAGNOSIS — S42201A Unspecified fracture of upper end of right humerus, initial encounter for closed fracture: Secondary | ICD-10-CM | POA: Diagnosis not present

## 2022-05-27 DIAGNOSIS — R2681 Unsteadiness on feet: Secondary | ICD-10-CM

## 2022-05-27 NOTE — Therapy (Signed)
OUTPATIENT PHYSICAL THERAPY TREATMENT   Patient Name: Stacey Ball MRN: [id number] DOB:04-Nov-1960, 61 y.o., female Today's Date: 05/27/2022   PT End of Session - 05/27/22 1514     Visit Number 4    Number of Visits 24    Date for PT Re-Evaluation 08/10/22    PT Start Time 5462    PT Stop Time 1558    PT Time Calculation (min) 43 min    Equipment Utilized During Treatment Gait belt    Activity Tolerance Patient tolerated treatment well;No increased pain    Behavior During Therapy WFL for tasks assessed/performed   Verbose but pleasant               Past Medical History:  Diagnosis Date   Anemia    HX of   Arthritis    Bipolar 1 disorder ([place])    Cancer (HCC)    skin cancer   Cataracts, bilateral    Diabetes mellitus without complication (HCC)    Dyspnea    on exertion (Pt states, "out of shape")   Function kidney decreased    High cholesterol    Hypertension    Hypothyroidism    Psoriasis    Psoriatic arthritis ([place])    Thyroid disease    Past Surgical History:  Procedure Laterality Date   CATARACT EXTRACTION W/PHACO Left 01/10/2020   Procedure: CATARACT EXTRACTION PHACO AND INTRAOCULAR LENS PLACEMENT ([place]) LEFT DIABETIC;  Surgeon: Marchia Meiers, MD;  Location: [facility];  Service: Ophthalmology;  Laterality: Left;  5.32 0:41.5   CATARACT EXTRACTION W/PHACO Right 02/07/2020   Procedure: CATARACT EXTRACTION PHACO AND INTRAOCULAR LENS PLACEMENT ([place]) RIGHT DIABETIC VISION BLUE 4.01 00:27.4;  Surgeon: Marchia Meiers, MD;  Location: [facility];  Service: Ophthalmology;  Laterality: Right;  Diabetic - oral meds   COLONOSCOPY WITH PROPOFOL N/A 03/04/2016   Procedure: COLONOSCOPY WITH PROPOFOL;  Surgeon: Manya Silvas, MD;  Location: [facility] ENDOSCOPY;  Service: Endoscopy;  Laterality: N/A;   great toe amputation     matrixectomy     TONSILLECTOMY     Patient Active Problem List   Diagnosis Date Noted   Hypothyroidism 03/26/2022   UTI (urinary  tract infection) 03/26/2022   Pneumonia due to COVID-19 virus 10/22/2020   Bipolar disorder ([place]) 10/22/2020   Acute encephalopathy 10/22/2020   Benign hypertensive kidney disease with chronic kidney disease 07/11/2019   Secondary hyperparathyroidism of renal origin ([place]) 07/11/2019   Class 1 obesity due to excess calories with serious comorbidity and body mass index (BMI) of 32.0 to 32.9 in adult 05/09/2019   Type 2 diabetes mellitus with stage 3 chronic kidney disease, without long-term current use of insulin ([place]) 09/16/2017   Stage 3 chronic kidney disease ([place]) 10/04/2016   Essential hypertriglyceridemia 03/22/2016   Essential hypertension 03/17/2016    PCP: Dion Body, MD  REFERRING PROVIDER: Mortimer Fries, PA-C  REFERRING DIAG: [phone] (ICD-10-CM) - Unspecified fracture of upper end of right humerus, initial encounter for closed fracture  THERAPY DIAG:  Muscle weakness (generalized)  Unsteadiness on feet  Difficulty in walking, not elsewhere classified  Rationale for Evaluation and Treatment Rehabilitation  ONSET DATE: 03/25/22  SUBJECTIVE:  SUBJECTIVE STATEMENT: Pt saw orhto doc and was told she can stop using her sling, and can start gently abducting her arm. Pt reports current pain in shoulder and B knees and her lower back. States, "it's weird and moves around" when referring to her pain.  PAIN:  Are you having pain? Yes, 7/10 currently  PERTINENT HISTORY: Stacey Ball is a 61 y.o. here for an acute issue. She has a PMH of HTN, hypothyroidism, DM2, bipolar, CKD, HLD, Covid January 2022. She suffered a recent fall on 03/25/2022 landing on her right shoulder suffering a humerus fracture. She was hospitalized on 03/26/2022. She was transferred to rehab facility, [facility]. Now  has returned home.     PRECAUTIONS: Shoulder no reaching, lifting, reports ortho doc d/c sling as of 05/27/2022  WEIGHT BEARING RESTRICTIONS No  FALLS:  Has patient fallen in last 6 months? Yes. Number of falls 4 Once at [place], once at night fall with walker, twice when alone at home.  LIVING ENVIRONMENT: Lives with: lives with their family and lives with their spouse Lives in: House/apartment Stairs: No Has following equipment at home: Quad cane small base, walker ( can't use due to shoulder) , shower chair, elevated toilet chair.   OCCUPATION: Retired, used to work part time ( 5 years ago)   PLOF: Independent with basic ADLs, Independent with household mobility with device, Independent with community mobility with device, Independent with homemaking with ambulation, Independent with gait, and pt reports her husband started doing all the chores which enabled her to become inactive   PATIENT GOALS Improve her shoulder function and improve her balance and mobility   OBJECTIVE:  All findings in objective taken at eval unless otherwise specified   DIAGNOSTIC FINDINGS:  There is a markedly comminuted and moderately displaced fracture of the right humeral surgical neck. On frontal view there appears to be approximately 1.5 cm fracture line craniocaudal diastasis. There is a cortical fragment measuring up to 2 cm that appears to be displaced medially 8 mm.   There is minimal varus angulation of the fracture. On transscapular Y-view there appears to be anterior angulation of the fracture and anterior displacement of the distal fracture component with respect to the proximal fracture component. There appears to be volume loss from a cortical fracture of the superolateral humeral head greater tuberosity.   The humeral head remains appropriately located with respect of the glenoid.   Mild acromioclavicular joint space narrowing and peripheral osteophytosis.   IMPRESSION: 1.  Markedly comminuted and moderately displaced fracture of the right humeral surgical neck. 2. Likely additional comminuted greater tuberosity of the humeral head fracture.   UPPER EXTREMITY ROM:   Passive ROM Right eval Left eval  Shoulder flexion 45 WNL  Shoulder extension    Shoulder abduction 45 WNL  Shoulder adduction    Shoulder internal rotation 15 WNL  Shoulder external rotation 0 WNL  Elbow flexion    Elbow extension    Wrist flexion    Wrist extension    Wrist ulnar deviation    Wrist radial deviation    Wrist pronation    Wrist supination    (Blank rows = not tested) -significant UT involvement with testing R shoulder flexion   UPPER EXTREMITY MMT:  Unable to assess due to restriction for active motion  MMT Right eval Left eval  Shoulder flexion  WNL  Shoulder extension    Shoulder abduction  WNL  Shoulder adduction    Shoulder internal rotation  WNL  Shoulder external rotation  WNL  (Blank rows = not tested)   TODAY'S TREATMENT:   05/27/2022  TherEx:  4# weights donned: Seated March 10x2 sets each LE LAQ 2x8 each LE  Seated DF 2x29 with 4# weights B   Seated PF 2x20 with 4# weights B     STS 10x use of LUE to assist   NMR: Gait belt donned and CGA provided throughout unless otherwise specified  Agility ladder step-length and balance intervention: one foot per square, LUE support on bar. Cuing throughout to not push through RUE  8x length of bars. Able to improve step-length, performs slowly   NBOS 30 sec NBOS with vert, horiz head turns 10x for each Tandem stance with LUE support on bar 2x30 sec each LE      PATIENT EDUCATION: Education details: Pt educated throughout session about proper posture and technique with exercises. Improved exercise technique, movement at target joints, use of target muscles after min to mod verbal, visual, tactile cues.  Person educated: Patient Education method: Explanation, Demonstration, Tactile cues,  and Verbal cues Education comprehension: verbalized understanding, returned demonstration, verbal cues required, tactile cues required, and needs further education   HOME EXERCISE PROGRAM:   No updates today, pt to continue HEP as previously given   05/20/2022: Access Code: [email or web address] URL: [email or web address] Date: 05/20/2022 Prepared by: Ricard Dillon  Exercises - Seated Toe Raise  - 1 x daily - 5 x weekly - 2 sets - 20 reps - 2 seconds hold - Seated Heel Raise  - 1 x daily - 5 x weekly - 2 sets - 20 reps - 2 seconds hold - Seated Long Arc Quad  - 1 x daily - 5 x weekly - 2 sets - 20 reps - 2 seconds hold - Seated March  - 1 x daily - 5 x weekly - 2 sets - 20 reps - 2 seconds hold    ASSESSMENT:  CLINICAL IMPRESSION: Pt able to advance strengthening interventions in level of resistance or reps. Tolerates well without pain or significant fatigue. Initiated instruction in balance and step-length intervention. Pt exhibited within-session improvement following reps and instruction. PT did have to cue pt throughout not to push down through her RUE. The pt will benefit  from PT interventions to improve her strength, balance, and to improve her QOL and function.    OBJECTIVE IMPAIRMENTS decreased activity tolerance, decreased ROM, decreased strength, hypomobility, impaired flexibility, impaired UE functional use, and pain.   ACTIVITY LIMITATIONS carrying, lifting, bathing, dressing, and self feeding  PARTICIPATION LIMITATIONS: meal prep, cleaning, laundry, and community activity  PERSONAL FACTORS 3+ comorbidities: HTN, DM, HLD   are also affecting patient's functional outcome.   REHAB POTENTIAL: Good  CLINICAL DECISION MAKING: Stable/uncomplicated  EVALUATION COMPLEXITY: Low   GOALS: Goals reviewed with patient? Yes  SHORT TERM GOALS: Target date: 06/15/2022   Patient will be independent in home exercise program to improve strength/mobility for better functional  independence with ADLs. Baseline: No HEP currently  Goal status: INITIAL    LONG TERM GOALS: Target date:  08/10/2022 1.  Patient (> 21 years old) will complete five times sit to stand test in < 15 seconds indicating an increased LE strength and improved balance. Baseline: 8/3: 27 sec  Goal status: INITIAL  2.  Patient will increase FOTO score to equal to or greater than  57   to demonstrate statistically significant improvement in mobility and quality of life.  Baseline: 27 Goal status: INITIAL   3.  Patient will increase Berg Balance score by > 6 points to demonstrate decreased fall risk during functional activities. Baseline: 28/56  Goal status: INITIAL   4.   Patient will reduce timed up and go to <11 seconds to reduce fall risk and demonstrate improved transfer/gait ability. Baseline: 8/3: 75 seconds with SPC  Goal status: INITIAL  5.   Patient will improve R shoulder PROM to 90 degrees flexion and abduction and 30 degrees IR and IR in order to progress to functional shoulder use as tissues continue to heal  Baseline: see eval  Goal status: INITIAL   PLAN: PT FREQUENCY: 2x/week  PT DURATION: 12 weeks  PLANNED INTERVENTIONS: Therapeutic exercises, Therapeutic activity, Neuromuscular re-education, Balance training, Gait training, Patient/Family education, Self Care, Joint mobilization, Aquatic Therapy, Dry Needling, Electrical stimulation, Moist heat, Taping, Ultrasound, and Manual therapy  PLAN FOR NEXT SESSION: Review HEP, LE strengthening, gait, balance   Zollie Pee, PT 05/27/2022, 5:03 PM

## 2022-06-01 ENCOUNTER — Ambulatory Visit: Payer: PPO | Admitting: Physical Therapy

## 2022-06-01 ENCOUNTER — Ambulatory Visit: Payer: PPO

## 2022-06-01 ENCOUNTER — Encounter: Payer: Self-pay | Admitting: Physical Therapy

## 2022-06-01 DIAGNOSIS — M25611 Stiffness of right shoulder, not elsewhere classified: Secondary | ICD-10-CM

## 2022-06-01 DIAGNOSIS — R2681 Unsteadiness on feet: Secondary | ICD-10-CM

## 2022-06-01 DIAGNOSIS — M25511 Pain in right shoulder: Secondary | ICD-10-CM

## 2022-06-01 DIAGNOSIS — R262 Difficulty in walking, not elsewhere classified: Secondary | ICD-10-CM

## 2022-06-01 DIAGNOSIS — M6281 Muscle weakness (generalized): Secondary | ICD-10-CM

## 2022-06-01 NOTE — Therapy (Unsigned)
OUTPATIENT OCCUPATIONAL THERAPY ORTHO EVALUATION  Patient Name: Stacey Ball MRN: [id number] DOB:12/02/1960, 61 y.o., female Today's Date: 06/02/2022  PCP: Dr. Netty Starring REFERRING PROVIDER: Mortimer Fries, PA  OT End of Session - 06/02/22 1036     Visit Number 1    Number of Visits 24    Date for OT Re-Evaluation 08/24/22    OT Start Time 1300    OT Stop Time 1345    OT Time Calculation (min) 45 min    Equipment Utilized During Treatment transport chair    Activity Tolerance Patient tolerated treatment well    Behavior During Therapy WFL for tasks assessed/performed             Past Medical History:  Diagnosis Date   Anemia    HX of   Arthritis    Bipolar 1 disorder ([place])    Cancer ([place])    skin cancer   Cataracts, bilateral    Diabetes mellitus without complication ([place])    Dyspnea    on exertion (Pt states, "out of shape")   Function kidney decreased    High cholesterol    Hypertension    Hypothyroidism    Psoriasis    Psoriatic arthritis ([place])    Thyroid disease    Past Surgical History:  Procedure Laterality Date   CATARACT EXTRACTION W/PHACO Left 01/10/2020   Procedure: CATARACT EXTRACTION PHACO AND INTRAOCULAR LENS PLACEMENT ([place]) LEFT DIABETIC;  Surgeon: Marchia Meiers, MD;  Location: [place];  Service: Ophthalmology;  Laterality: Left;  5.32 0:41.5   CATARACT EXTRACTION W/PHACO Right 02/07/2020   Procedure: CATARACT EXTRACTION PHACO AND INTRAOCULAR LENS PLACEMENT ([place]) RIGHT DIABETIC VISION BLUE 4.01 00:27.4;  Surgeon: Marchia Meiers, MD;  Location: [place];  Service: Ophthalmology;  Laterality: Right;  Diabetic - oral meds   COLONOSCOPY WITH PROPOFOL N/A 03/04/2016   Procedure: COLONOSCOPY WITH PROPOFOL;  Surgeon: Manya Silvas, MD;  Location: [facility] ENDOSCOPY;  Service: Endoscopy;  Laterality: N/A;   great toe amputation     matrixectomy     TONSILLECTOMY     Patient Active Problem List   Diagnosis Date Noted   Hypothyroidism  03/26/2022   UTI (urinary tract infection) 03/26/2022   Pneumonia due to COVID-19 virus 10/22/2020   Bipolar disorder ([place]) 10/22/2020   Acute encephalopathy 10/22/2020   Benign hypertensive kidney disease with chronic kidney disease 07/11/2019   Secondary hyperparathyroidism of renal origin ([place]) 07/11/2019   Class 1 obesity due to excess calories with serious comorbidity and body mass index (BMI) of 32.0 to 32.9 in adult 05/09/2019   Type 2 diabetes mellitus with stage 3 chronic kidney disease, without long-term current use of insulin ([place]) 09/16/2017   Stage 3 chronic kidney disease ([place]) 10/04/2016   Essential hypertriglyceridemia 03/22/2016   Essential hypertension 03/17/2016    ONSET DATE: 03/26/22  REFERRING DIAG: Shoulder pain, right; unspecified fx of upper end of right humerus, initial encounter for closed fx.  THERAPY DIAG:  Muscle weakness (generalized)  Right shoulder pain, unspecified chronicity  Stiffness of right shoulder, not elsewhere classified  Rationale for Evaluation and Treatment Rehabilitation  SUBJECTIVE:  SUBJECTIVE STATEMENT: "I've just been doing pendulum swings." Pt accompanied by: self  PERTINENT HISTORY: Per chart on 03/26/22, 61 y/o old female with PMH significant for bipolar disorder, anemia, cataracts, hyperlipidemia, hypertension, hypothyroidism, obesity, type 2 diabetes, CKD stage IIIb presented in the ED with c/o: generalized  weakness and recurrent falls. Patient has generalized weakness for some time which has gotten worse.  She had a fall yesterday and was evaluated outpatient and found to have right humeral fracture and was placed in a sling.  Patient had additional fall yesterday, denies any loss of consciousness or head trauma or head injury. In the ED work-up revealed UA positive for UTI.  Lactic acid normal.  x-ray right shoulder shows humeral neck fracture. ED physician spoke with orthopedics to discuss need for urgent evaluation.  Patient was  admitted for generalized weakness secondary to UTI,  started on IV antibiotics.  Orthopedics consulted,  recommended conservative management. Patient is not a candidate for surgical intervention.  Advised outpatient follow-up.  Patient completed antibiotics for 3 days. PT recommended  skilled nursing facility for rehab.  Patient feels better and want to be discharged.  Patient being discharged skilled nursing facility for rehab.  PRECAUTIONS: Per secure chat with orthopedist Dr. Sabra Heck on 8/15//23, ok to begin gentle ROM and pt may put some weight into the RUE.  WEIGHT BEARING RESTRICTIONS No  PAIN:  Are you having pain? Yes: Pain location: R shoulder 4/10 pain at rest, 6/10 pain with activity  Pain description: achy Aggravating factors: lifting up arm  Relieving factors: rest, tylenol   FALLS: Has patient fallen in last 6 months? Yes. Number of falls "a bunch"  LIVING ENVIRONMENT: Lives with: lives with their spouse Lives in: first floor apartment Stairs: No Has following equipment at home: Lobbyist, Environmental consultant - 2 wheeled, Wheelchair (manual), Shower bench, and bed side commode  PLOF: Independent with basic ADLs, spouse and pt shared IADL responsibilities  PATIENT GOALS  "I'd like to be able to use it the way I used to and hold it up all the way."  OBJECTIVE:  HAND DOMINANCE: Right  ADLs: Overall ADLs: spouse provides assist as needed Transfers/ambulation related to ADLs: transport chair with the hospital, [facility] at home Eating: uses L non-dominant hand to cut food  Grooming: using L non-dominant arm to brush hair UB Dressing: extra time and effort LB Dressing: supv, occasional min A  Toileting: modified indep using L non-dominant hand for hygiene Bathing: supv from spouse and spouse helps to wash hair  Tub Shower transfers: supv with transfer tub bench  Equipment: Transfer tub bench  FUNCTIONAL OUTCOME MEASURES: FOTO: 28  UPPER EXTREMITY ROM     Active ROM  Right eval Left Eval WNL  Shoulder flexion 20 (35)   Shoulder abduction 25 (35)         Shoulder adduction    Shoulder extension    Shoulder internal rotation R thumb to R upper buttock ~55   Shoulder external rotation 0 (10)   Elbow flexion WNL   Elbow extension WNL   (Blank rows = not tested)    UPPER EXTREMITY MMT:     MMT Right Eval NT Left Eval 5/5  Shoulder flexion    Shoulder abduction    Shoulder adduction    Shoulder extension    Shoulder internal rotation    Shoulder external rotation    Elbow flexion    Elbow extension    (Blank rows = not tested)  HAND FUNCTION: Grip strength: Right: 15 lbs; Left: 24 lbs, Lateral pinch: Right: 11 lbs, Left: 10 lbs, and 3 point pinch: Right: 7 lbs, Left: 6 lbs  SENSATION: WFL  EDEMA: mild R upper arm  COGNITION: Overall cognitive status: Impaired Areas of impairment: Memory: Deficits    OBSERVATIONS: Pt pleasant and cooperative with all evaluation activities.  Pain and limited ROM in R  shoulder significantly limiting engagement of RUE for all self care tasks.   TODAY'S TREATMENT:  Evaluation completed.   Therapeutic Exercise: Reviewed current HEP (pendulum swings all directions).  Pt has been completing swings in sitting, but encouraged pt to prop LUE on table top to allow pt to bend over from the waist and allow gravity to move the RUE, which allowed pt more room for shoulder mobility than in the chair.  Pt able to return demo with tactile and vc for technique.  Advised 10 reps each way, several times per day.  Pt verbalized understanding.     PATIENT EDUCATION: Education details: OT role, goals, poc, pendulum swing technique Person educated: Patient Education method: Explanation, Tactile cues, and Verbal cues Education comprehension: verbalized understanding, returned demonstration, verbal cues required, tactile cues required, and needs further education   HOME EXERCISE PROGRAM: Pendulum swings; will add on  with additional visits   GOALS: Goals reviewed with patient? Yes  SHORT TERM GOALS: Target date: 07/13/22    Pt will be indep to perform HEP for increasing ROM to the RUE.  Baseline:  Requires vc and tactile cues for pendulum swings; will add on additonal exercises with future visits  Goal status: INITIAL   LONG TERM GOALS: Target date: 08/24/22    Pt will increase FOTO score to 50 or better to indicate improved functional performance with daily tasks. Baseline: 28 Goal status: INITIAL  2.  Pt will increase active shoulder flexion and abd to 70 degrees or better to increase use of the RUE during UB ADLs. Baseline: active R shoulder flex 20, abd 25 Goal status: INITIAL  3.  Pt will increase active R shoulder ER to 30 degrees or better to enable brushing/combing R side of head with R dominant hand. Baseline: unable (active ER 0) Goal status: INITIAL  4.  Pt will increase R grip strength by 10 or more lbs to improve ability to hold and carry ADL supplies in R dominant hand without dropping. Baseline: R 15# (L 24#) Goal status: INITIAL   ASSESSMENT:  CLINICAL IMPRESSION: Patient is a 61 y.o. female who was seen today for occupational therapy evaluation for assessment of R shoulder pain, decreased ROM, and decreased functional use following a fall on 03/26/22 where she sustained a R proximal humerus fx.  Pt is being seen by PT to focus on balance and mobility d/t frequent falls, while OT will plan to address R shoulder limitations which impact ADL perfomance.  PERFORMANCE DEFICITS in functional skills including ADLs, IADLs, edema, ROM, strength, pain, flexibility, balance, body mechanics, decreased knowledge of precautions, decreased knowledge of use of DME, and UE functional use, cognitive skills including memory, and psychosocial skills including.   IMPAIRMENTS are limiting patient from ADLs, IADLs, and leisure.   COMORBIDITIES may have co-morbidities  that affects occupational  performance. Patient will benefit from skilled OT to address above impairments and improve overall function.  MODIFICATION OR ASSISTANCE TO COMPLETE EVALUATION: Min-Moderate modification of tasks or assist with assess necessary to complete an evaluation.  OT OCCUPATIONAL PROFILE AND HISTORY: Problem focused assessment: Including review of records relating to presenting problem.  CLINICAL DECISION MAKING: Moderate - several treatment options, min-mod task modification necessary  REHAB POTENTIAL: Good  EVALUATION COMPLEXITY: Moderate      PLAN: OT FREQUENCY: 2x/week  OT DURATION: 12 weeks  PLANNED INTERVENTIONS: self care/ADL training, therapeutic exercise, therapeutic activity, manual therapy, passive range of motion, balance training, moist heat, cryotherapy, patient/family education, energy conservation, and DME and/or  AE instructions  RECOMMENDED OTHER SERVICES: N/A  CONSULTED AND AGREED WITH PLAN OF CARE: Patient  PLAN FOR NEXT SESSION: gentle PROM/HEP progression   Leta Speller, MS, OTR/L   Darleene Cleaver, OT 06/02/2022, 10:41 AM

## 2022-06-01 NOTE — Therapy (Signed)
OUTPATIENT PHYSICAL THERAPY TREATMENT   Patient Name: Stacey Ball MRN: [id number] DOB:[birth year], 61 y.o., female Today's Date: 06/01/2022   PT End of Session - 06/01/22 1325     Visit Number 5    Number of Visits 24    Date for PT Re-Evaluation 08/10/22    PT Start Time 8127    PT Stop Time 1427    PT Time Calculation (min) 42 min    Equipment Utilized During Treatment Gait belt    Activity Tolerance Patient tolerated treatment well;No increased pain    Behavior During Therapy WFL for tasks assessed/performed   Verbose but pleasant               Past Medical History:  Diagnosis Date   Anemia    HX of   Arthritis    Bipolar 1 disorder ([place])    Cancer ([facility])    skin cancer   Cataracts, bilateral    Diabetes mellitus without complication ([facility])    Dyspnea    on exertion (Pt states, "out of shape")   Function kidney decreased    High cholesterol    Hypertension    Hypothyroidism    Psoriasis    Psoriatic arthritis ([place])    Thyroid disease    Past Surgical History:  Procedure Laterality Date   CATARACT EXTRACTION W/PHACO Left 01/10/2020   Procedure: CATARACT EXTRACTION PHACO AND INTRAOCULAR LENS PLACEMENT ([place]) LEFT DIABETIC;  Surgeon: Marchia Meiers, MD;  Location: [place];  Service: Ophthalmology;  Laterality: Left;  5.32 0:41.5   CATARACT EXTRACTION W/PHACO Right 02/07/2020   Procedure: CATARACT EXTRACTION PHACO AND INTRAOCULAR LENS PLACEMENT ([place]) RIGHT DIABETIC VISION BLUE 4.01 00:27.4;  Surgeon: Marchia Meiers, MD;  Location: [place];  Service: Ophthalmology;  Laterality: Right;  Diabetic - oral meds   COLONOSCOPY WITH PROPOFOL N/A 03/04/2016   Procedure: COLONOSCOPY WITH PROPOFOL;  Surgeon: Manya Silvas, MD;  Location: [facility] ENDOSCOPY;  Service: Endoscopy;  Laterality: N/A;   great toe amputation     matrixectomy     TONSILLECTOMY     Patient Active Problem List   Diagnosis Date Noted   Hypothyroidism 03/26/2022   UTI (urinary  tract infection) 03/26/2022   Pneumonia due to COVID-19 virus 10/22/2020   Bipolar disorder ([place]) 10/22/2020   Acute encephalopathy 10/22/2020   Benign hypertensive kidney disease with chronic kidney disease 07/11/2019   Secondary hyperparathyroidism of renal origin ([place]) 07/11/2019   Class 1 obesity due to excess calories with serious comorbidity and body mass index (BMI) of 32.0 to 32.9 in adult 05/09/2019   Type 2 diabetes mellitus with stage 3 chronic kidney disease, without long-term current use of insulin ([place]) 09/16/2017   Stage 3 chronic kidney disease ([place]) 10/04/2016   Essential hypertriglyceridemia 03/22/2016   Essential hypertension 03/17/2016    PCP: Dion Body, MD  REFERRING PROVIDER: Mortimer Fries, PA-C  REFERRING DIAG: [phone] (ICD-10-CM) - Unspecified fracture of upper end of right humerus, initial encounter for closed fracture  THERAPY DIAG:  Muscle weakness (generalized)  Unsteadiness on feet  Difficulty in walking, not elsewhere classified  Rationale for Evaluation and Treatment Rehabilitation  ONSET DATE: 06/01/22   SUBJECTIVE:  SUBJECTIVE STATEMENT: Pt saw orhto doc and was told she can stop using her sling, and can start gently abducting her arm. Pt reports current pain in shoulder and B knees and her lower back. States, "it's weird and moves around" when referring to her pain.  PAIN:  Are you having pain? Yes, 7/10 currently  PERTINENT HISTORY: Stacey Ball is a 61 y.o. here for an acute issue. She has a PMH of HTN, hypothyroidism, DM2, bipolar, CKD, HLD, Covid January 2022. She suffered a recent fall on 03/25/2022 landing on her right shoulder suffering a humerus fracture. She was hospitalized on 03/26/2022. She was transferred to rehab facility, [facility].  Now has returned home.     PRECAUTIONS: Shoulder no reaching, lifting, reports ortho doc d/c sling as of 05/27/2022  WEIGHT BEARING RESTRICTIONS No  FALLS:  Has patient fallen in last 6 months? Yes. Number of falls 4 Once at [facility], once at night fall with walker, twice when alone at home.  LIVING ENVIRONMENT: Lives with: lives with their family and lives with their spouse Lives in: House/apartment Stairs: No Has following equipment at home: Quad cane small base, walker ( can't use due to shoulder) , shower chair, elevated toilet chair.   OCCUPATION: Retired, used to work part time ( 5 years ago)   PLOF: Independent with basic ADLs, Independent with household mobility with device, Independent with community mobility with device, Independent with homemaking with ambulation, Independent with gait, and pt reports her husband started doing all the chores which enabled her to become inactive   PATIENT GOALS Improve her shoulder function and improve her balance and mobility   OBJECTIVE:  All findings in objective taken at eval unless otherwise specified   DIAGNOSTIC FINDINGS:  There is a markedly comminuted and moderately displaced fracture of the right humeral surgical neck. On frontal view there appears to be approximately 1.5 cm fracture line craniocaudal diastasis. There is a cortical fragment measuring up to 2 cm that appears to be displaced medially 8 mm.   There is minimal varus angulation of the fracture. On transscapular Y-view there appears to be anterior angulation of the fracture and anterior displacement of the distal fracture component with respect to the proximal fracture component. There appears to be volume loss from a cortical fracture of the superolateral humeral head greater tuberosity.   The humeral head remains appropriately located with respect of the glenoid.   Mild acromioclavicular joint space narrowing and peripheral osteophytosis.    IMPRESSION: 1. Markedly comminuted and moderately displaced fracture of the right humeral surgical neck. 2. Likely additional comminuted greater tuberosity of the humeral head fracture.  From imaging results of shoulder from pt's chart   UPPER EXTREMITY ROM:   Passive ROM Right eval Left eval  Shoulder flexion 45 WNL  Shoulder extension    Shoulder abduction 45 WNL  Shoulder adduction    Shoulder internal rotation 15 WNL  Shoulder external rotation 0 WNL  Elbow flexion    Elbow extension    Wrist flexion    Wrist extension    Wrist ulnar deviation    Wrist radial deviation    Wrist pronation    Wrist supination    (Blank rows = not tested) -significant UT involvement with testing R shoulder flexion   UPPER EXTREMITY MMT:  Unable to assess due to restriction for active motion  MMT Right eval Left eval  Shoulder flexion  WNL  Shoulder extension    Shoulder abduction  WNL  Shoulder  adduction    Shoulder internal rotation  WNL  Shoulder external rotation  WNL  (Blank rows = not tested)   TODAY'S TREATMENT:   06/01/22   TherEx:  Nustep level 1 x 5 min cues for 40 steps per minute patient only able to maintain 25-30 steps per minute without significant fatigue.  4# weights donned: Seated March 10x2 sets each LE LAQ 2x10 each LE  Seated DF 2x30 with 4# weights B   Seated PF 2x30 with 4# weights B  Ambulation with straight point cane and cues for proper sequencing of cane.  Patient encouraged to take small steps with cane to ensure safe cane utilization at home.     STS 10x use of LUE to assist -   Gait belt donned and CGA provided throughout unless otherwise specified   Airex stance no UE 2 x 30 seconds   Lateral stepping in parallel bars x4 links the parallel bars.  Cues for proper form for muscle recruitment.  Ambulation with left upper extremity assist in parallel bars x16 feet      PATIENT EDUCATION: Education details: Pt educated  throughout session about proper posture and technique with exercises. Improved exercise technique, movement at target joints, use of target muscles after min to mod verbal, visual, tactile cues.  Person educated: Patient Education method: Explanation, Demonstration, Tactile cues, and Verbal cues Education comprehension: verbalized understanding, returned demonstration, verbal cues required, tactile cues required, and needs further education   HOME EXERCISE PROGRAM:   No updates today, pt to continue HEP as previously given   05/20/2022: Access Code: [email or web address] URL: [email or web address] Date: 05/20/2022 Prepared by: Ricard Dillon  Exercises - Seated Toe Raise  - 1 x daily - 5 x weekly - 2 sets - 20 reps - 2 seconds hold - Seated Heel Raise  - 1 x daily - 5 x weekly - 2 sets - 20 reps - 2 seconds hold - Seated Long Arc Quad  - 1 x daily - 5 x weekly - 2 sets - 20 reps - 2 seconds hold - Seated March  - 1 x daily - 5 x weekly - 2 sets - 20 reps - 2 seconds hold    ASSESSMENT:  CLINICAL IMPRESSION: Continued with current plan of care as laid out in evaluation and recent prior sessions. Pt remains motivated to advance progress toward goals in order to maximize independence and safety at home. Pt requires high level assistance and cuing for completion of exercises in order to provide adequate level of stimulation challenge while minimizing pain and discomfort when possible. Pt closely monitored throughout session pt response and to maximize patient safety during interventions.  Patient demonstrated improved ambulatory capacity of this date but did have some difficulty sequencing cane and will benefit from continued practice with this in the future.  Patient also had trouble maintaining sats per minute on NuStep indicating significant cardiovascular and muscular endurance impairments continue to benefit from interventions targeting this in the future.  Pt continues to demonstrate  progress toward goals AEB progression of interventions this date either in volume or intensity.    OBJECTIVE IMPAIRMENTS decreased activity tolerance, decreased ROM, decreased strength, hypomobility, impaired flexibility, impaired UE functional use, and pain.   ACTIVITY LIMITATIONS carrying, lifting, bathing, dressing, and self feeding  PARTICIPATION LIMITATIONS: meal prep, cleaning, laundry, and community activity  PERSONAL FACTORS 3+ comorbidities: HTN, DM, HLD   are also affecting patient's functional outcome.   REHAB POTENTIAL: Good  CLINICAL DECISION MAKING: Stable/uncomplicated  EVALUATION COMPLEXITY: Low   GOALS: Goals reviewed with patient? Yes  SHORT TERM GOALS: Target date: 06/15/2022   Patient will be independent in home exercise program to improve strength/mobility for better functional independence with ADLs. Baseline: No HEP currently  Goal status: INITIAL    LONG TERM GOALS: Target date:  08/10/2022 1.  Patient (> 54 years old) will complete five times sit to stand test in < 15 seconds indicating an increased LE strength and improved balance. Baseline: 8/3: 27 sec  Goal status: INITIAL  2.  Patient will increase FOTO score to equal to or greater than  57   to demonstrate statistically significant improvement in mobility and quality of life.  Baseline: 27 Goal status: INITIAL   3.  Patient will increase Berg Balance score by > 6 points to demonstrate decreased fall risk during functional activities. Baseline: 28/56  Goal status: INITIAL   4.   Patient will reduce timed up and go to <11 seconds to reduce fall risk and demonstrate improved transfer/gait ability. Baseline: 8/3: 75 seconds with SPC  Goal status: INITIAL  5.   Patient will improve R shoulder PROM to 90 degrees flexion and abduction and 30 degrees IR and IR in order to progress to functional shoulder use as tissues continue to heal  Baseline: see eval  Goal status: INITIAL   PLAN: PT  FREQUENCY: 2x/week  PT DURATION: 12 weeks  PLANNED INTERVENTIONS: Therapeutic exercises, Therapeutic activity, Neuromuscular re-education, Balance training, Gait training, Patient/Family education, Self Care, Joint mobilization, Aquatic Therapy, Dry Needling, Electrical stimulation, Moist heat, Taping, Ultrasound, and Manual therapy  PLAN FOR NEXT SESSION: Review HEP, LE strengthening, gait, balance   Particia Lather, PT 06/01/2022, 1:25 PM

## 2022-06-03 ENCOUNTER — Ambulatory Visit: Payer: PPO

## 2022-06-03 DIAGNOSIS — M6281 Muscle weakness (generalized): Secondary | ICD-10-CM

## 2022-06-03 DIAGNOSIS — R262 Difficulty in walking, not elsewhere classified: Secondary | ICD-10-CM

## 2022-06-03 DIAGNOSIS — R2681 Unsteadiness on feet: Secondary | ICD-10-CM

## 2022-06-03 DIAGNOSIS — M25611 Stiffness of right shoulder, not elsewhere classified: Secondary | ICD-10-CM | POA: Diagnosis not present

## 2022-06-03 NOTE — Therapy (Signed)
OUTPATIENT PHYSICAL THERAPY TREATMENT   Patient Name: NHYIRA LEANO MRN: [id number] DOB:02-04-1961, 61 y.o., female Today's Date: 06/03/2022   PT End of Session - 06/03/22 1621     Visit Number 6    Number of Visits 24    Date for PT Re-Evaluation 08/10/22    PT Start Time 0102    PT Stop Time 1515    PT Time Calculation (min) 42 min    Equipment Utilized During Treatment Gait belt    Activity Tolerance Patient tolerated treatment well;No increased pain    Behavior During Therapy WFL for tasks assessed/performed   Verbose but pleasant                Past Medical History:  Diagnosis Date   Anemia    HX of   Arthritis    Bipolar 1 disorder ([place])    Cancer (HCC)    skin cancer   Cataracts, bilateral    Diabetes mellitus without complication (HCC)    Dyspnea    on exertion (Pt states, "out of shape")   Function kidney decreased    High cholesterol    Hypertension    Hypothyroidism    Psoriasis    Psoriatic arthritis ([place])    Thyroid disease    Past Surgical History:  Procedure Laterality Date   CATARACT EXTRACTION W/PHACO Left 01/10/2020   Procedure: CATARACT EXTRACTION PHACO AND INTRAOCULAR LENS PLACEMENT ([place]) LEFT DIABETIC;  Surgeon: Marchia Meiers, MD;  Location: [place];  Service: Ophthalmology;  Laterality: Left;  5.32 0:41.5   CATARACT EXTRACTION W/PHACO Right 02/07/2020   Procedure: CATARACT EXTRACTION PHACO AND INTRAOCULAR LENS PLACEMENT ([place]) RIGHT DIABETIC VISION BLUE 4.01 00:27.4;  Surgeon: Marchia Meiers, MD;  Location: [place];  Service: Ophthalmology;  Laterality: Right;  Diabetic - oral meds   COLONOSCOPY WITH PROPOFOL N/A 03/04/2016   Procedure: COLONOSCOPY WITH PROPOFOL;  Surgeon: Manya Silvas, MD;  Location: [facility] ENDOSCOPY;  Service: Endoscopy;  Laterality: N/A;   great toe amputation     matrixectomy     TONSILLECTOMY     Patient Active Problem List   Diagnosis Date Noted   Hypothyroidism 03/26/2022   UTI  (urinary tract infection) 03/26/2022   Pneumonia due to COVID-19 virus 10/22/2020   Bipolar disorder ([place]) 10/22/2020   Acute encephalopathy 10/22/2020   Benign hypertensive kidney disease with chronic kidney disease 07/11/2019   Secondary hyperparathyroidism of renal origin ([place]) 07/11/2019   Class 1 obesity due to excess calories with serious comorbidity and body mass index (BMI) of 32.0 to 32.9 in adult 05/09/2019   Type 2 diabetes mellitus with stage 3 chronic kidney disease, without long-term current use of insulin ([place]) 09/16/2017   Stage 3 chronic kidney disease ([place]) 10/04/2016   Essential hypertriglyceridemia 03/22/2016   Essential hypertension 03/17/2016    PCP: Dion Body, MD  REFERRING PROVIDER: Mortimer Fries, PA-C  REFERRING DIAG: [phone] (ICD-10-CM) - Unspecified fracture of upper end of right humerus, initial encounter for closed fracture  THERAPY DIAG:  Unsteadiness on feet  Muscle weakness (generalized)  Difficulty in walking, not elsewhere classified  Rationale for Evaluation and Treatment Rehabilitation  ONSET DATE: 06/03/22   SUBJECTIVE:  SUBJECTIVE STATEMENT: Pt reports no pain currently and no recent stumbles/falls. Pt reports no other current concerns except for occ knee pain.   PAIN:  Are you having pain? Yes, 0/10 currently  PERTINENT HISTORY: Kely Dohn Mcdougall is a 61 y.o. here for an acute issue. She has a PMH of HTN, hypothyroidism, DM2, bipolar, CKD, HLD, Covid January 2022. She suffered a recent fall on 03/25/2022 landing on her right shoulder suffering a humerus fracture. She was hospitalized on 03/26/2022. She was transferred to rehab facility, [facility]. Now has returned home.     PRECAUTIONS: Shoulder no reaching, lifting, reports ortho doc d/c sling  as of 05/27/2022  WEIGHT BEARING RESTRICTIONS No  FALLS:  Has patient fallen in last 6 months? Yes. Number of falls 4 Once at [facility], once at night fall with walker, twice when alone at home.  LIVING ENVIRONMENT: Lives with: lives with their family and lives with their spouse Lives in: House/apartment Stairs: No Has following equipment at home: Quad cane small base, walker ( can't use due to shoulder) , shower chair, elevated toilet chair.   OCCUPATION: Retired, used to work part time ( 5 years ago)   PLOF: Independent with basic ADLs, Independent with household mobility with device, Independent with community mobility with device, Independent with homemaking with ambulation, Independent with gait, and pt reports her husband started doing all the chores which enabled her to become inactive   PATIENT GOALS Improve her shoulder function and improve her balance and mobility   OBJECTIVE:  All findings in objective taken at eval unless otherwise specified   DIAGNOSTIC FINDINGS:  There is a markedly comminuted and moderately displaced fracture of the right humeral surgical neck. On frontal view there appears to be approximately 1.5 cm fracture line craniocaudal diastasis. There is a cortical fragment measuring up to 2 cm that appears to be displaced medially 8 mm.   There is minimal varus angulation of the fracture. On transscapular Y-view there appears to be anterior angulation of the fracture and anterior displacement of the distal fracture component with respect to the proximal fracture component. There appears to be volume loss from a cortical fracture of the superolateral humeral head greater tuberosity.   The humeral head remains appropriately located with respect of the glenoid.   Mild acromioclavicular joint space narrowing and peripheral osteophytosis.   IMPRESSION: 1. Markedly comminuted and moderately displaced fracture of the right humeral surgical neck. 2.  Likely additional comminuted greater tuberosity of the humeral head fracture.  From imaging results of shoulder from pt's chart   UPPER EXTREMITY ROM:   Passive ROM Right eval Left eval  Shoulder flexion 45 WNL  Shoulder extension    Shoulder abduction 45 WNL  Shoulder adduction    Shoulder internal rotation 15 WNL  Shoulder external rotation 0 WNL  Elbow flexion    Elbow extension    Wrist flexion    Wrist extension    Wrist ulnar deviation    Wrist radial deviation    Wrist pronation    Wrist supination    (Blank rows = not tested) -significant UT involvement with testing R shoulder flexion   UPPER EXTREMITY MMT:  Unable to assess due to restriction for active motion  MMT Right eval Left eval  Shoulder flexion  WNL  Shoulder extension    Shoulder abduction  WNL  Shoulder adduction    Shoulder internal rotation  WNL  Shoulder external rotation  WNL  (Blank rows = not tested)  TODAY'S TREATMENT:   06/03/22 Gait belt donned and CGA provided throughout unless otherwise specified   TherEx:  Nustep level 1-2 x 5 min cues for 40 steps per minute, pt struggles to increase SPM. Instructed to only use LUE and not RUE with intervention. Close CGA for mount/dismount.  4# weights donned: Seated March 12x2 sets each LE. Medium  LAQ 2x12 each LE  Seated DF 1x20 with 4# weights B   Seated PF 1x20 with 4# weights B  Matrix cable machine hamstring curls 2.5# 2x12, 1x6 7.5# BLE  STS 12x use of LUE to assist - able to complete a few reps without use of LUE, exhibits fatigue  Provided pt with new copy of HEP per request   NMR: Agility ladder ambulation with straight point cane and cues for proper sequencing of cane and to promote increased step-length B 2x through.  Close CGA throughout.       PATIENT EDUCATION: Education details: Pt educated throughout session about proper posture and technique with exercises. Improved exercise technique, movement at target  joints, use of target muscles after min to mod verbal, visual, tactile cues.  Person educated: Patient Education method: Explanation, Demonstration, Tactile cues, and Verbal cues Education comprehension: verbalized understanding, returned demonstration, verbal cues required, tactile cues required, and needs further education   HOME EXERCISE PROGRAM:   No updates today, pt to continue HEP as previously given   05/20/2022: Access Code: [email or web address] URL: [email or web address] Date: 05/20/2022 Prepared by: Ricard Dillon  Exercises - Seated Toe Raise  - 1 x daily - 5 x weekly - 2 sets - 20 reps - 2 seconds hold - Seated Heel Raise  - 1 x daily - 5 x weekly - 2 sets - 20 reps - 2 seconds hold - Seated Long Arc Quad  - 1 x daily - 5 x weekly - 2 sets - 20 reps - 2 seconds hold - Seated March  - 1 x daily - 5 x weekly - 2 sets - 20 reps - 2 seconds hold    ASSESSMENT:  CLINICAL IMPRESSION: Pt continues to progress with strengthening interventions by performing exercises with increased reps. Other focus on improving balance and gait with agility ladder exercise. With cuing pt did show within-session improvement in step-length, most notable on RLE. Will continue to focus on this area. Pt will benefit from further skilled PT to improve strength, gait, balance and mobility to decrease fall risk and increase QOL.    OBJECTIVE IMPAIRMENTS decreased activity tolerance, decreased ROM, decreased strength, hypomobility, impaired flexibility, impaired UE functional use, and pain.   ACTIVITY LIMITATIONS carrying, lifting, bathing, dressing, and self feeding  PARTICIPATION LIMITATIONS: meal prep, cleaning, laundry, and community activity  PERSONAL FACTORS 3+ comorbidities: HTN, DM, HLD   are also affecting patient's functional outcome.   REHAB POTENTIAL: Good  CLINICAL DECISION MAKING: Stable/uncomplicated  EVALUATION COMPLEXITY: Low   GOALS: Goals reviewed with patient? Yes  SHORT  TERM GOALS: Target date: 06/15/2022   Patient will be independent in home exercise program to improve strength/mobility for better functional independence with ADLs. Baseline: No HEP currently  Goal status: INITIAL    LONG TERM GOALS: Target date:  08/10/2022 1.  Patient (> 27 years old) will complete five times sit to stand test in < 15 seconds indicating an increased LE strength and improved balance. Baseline: 8/3: 27 sec  Goal status: INITIAL  2.  Patient will increase FOTO score to equal to or greater than  57   to  demonstrate statistically significant improvement in mobility and quality of life.  Baseline: 27 Goal status: INITIAL   3.  Patient will increase Berg Balance score by > 6 points to demonstrate decreased fall risk during functional activities. Baseline: 28/56  Goal status: INITIAL   4.   Patient will reduce timed up and go to <11 seconds to reduce fall risk and demonstrate improved transfer/gait ability. Baseline: 8/3: 75 seconds with SPC  Goal status: INITIAL  5.   Patient will improve R shoulder PROM to 90 degrees flexion and abduction and 30 degrees IR and IR in order to progress to functional shoulder use as tissues continue to heal  Baseline: see eval  Goal status: INITIAL   PLAN: PT FREQUENCY: 2x/week  PT DURATION: 12 weeks  PLANNED INTERVENTIONS: Therapeutic exercises, Therapeutic activity, Neuromuscular re-education, Balance training, Gait training, Patient/Family education, Self Care, Joint mobilization, Aquatic Therapy, Dry Needling, Electrical stimulation, Moist heat, Taping, Ultrasound, and Manual therapy  PLAN FOR NEXT SESSION: Review HEP, LE strengthening, gait, balance, continue plan   Zollie Pee, PT 06/03/2022, 4:27 PM

## 2022-06-08 ENCOUNTER — Ambulatory Visit: Payer: PPO

## 2022-06-10 ENCOUNTER — Encounter: Payer: Self-pay | Admitting: Occupational Therapy

## 2022-06-10 ENCOUNTER — Ambulatory Visit: Payer: PPO | Admitting: Occupational Therapy

## 2022-06-10 DIAGNOSIS — M25611 Stiffness of right shoulder, not elsewhere classified: Secondary | ICD-10-CM | POA: Diagnosis not present

## 2022-06-10 DIAGNOSIS — M25511 Pain in right shoulder: Secondary | ICD-10-CM

## 2022-06-10 DIAGNOSIS — M6281 Muscle weakness (generalized): Secondary | ICD-10-CM

## 2022-06-12 NOTE — Therapy (Signed)
OUTPATIENT OCCUPATIONAL THERAPY ORTHO TREATMENT  Patient Name: Stacey Ball MRN: [id number] DOB:12/03/1960, 61 y.o., female Today's Date: 06/10/2022  PCP: Dr. Netty Starring REFERRING PROVIDER: Mortimer Fries, PA  OT End of Session - 06/12/22 1824     Visit Number 2    Number of Visits 24    Date for OT Re-Evaluation 08/24/22    OT Start Time 1259    OT Stop Time 1345    OT Time Calculation (min) 46 min    Equipment Utilized During Treatment transport chair    Activity Tolerance Patient tolerated treatment well    Behavior During Therapy WFL for tasks assessed/performed             Past Medical History:  Diagnosis Date   Anemia    HX of   Arthritis    Bipolar 1 disorder ([place])    Cancer ([place])    skin cancer   Cataracts, bilateral    Diabetes mellitus without complication ([place])    Dyspnea    on exertion (Pt states, "out of shape")   Function kidney decreased    High cholesterol    Hypertension    Hypothyroidism    Psoriasis    Psoriatic arthritis ([place])    Thyroid disease    Past Surgical History:  Procedure Laterality Date   CATARACT EXTRACTION W/PHACO Left 01/10/2020   Procedure: CATARACT EXTRACTION PHACO AND INTRAOCULAR LENS PLACEMENT ([place]) LEFT DIABETIC;  Surgeon: Marchia Meiers, MD;  Location: [place];  Service: Ophthalmology;  Laterality: Left;  5.32 0:41.5   CATARACT EXTRACTION W/PHACO Right 02/07/2020   Procedure: CATARACT EXTRACTION PHACO AND INTRAOCULAR LENS PLACEMENT ([place]) RIGHT DIABETIC VISION BLUE 4.01 00:27.4;  Surgeon: Marchia Meiers, MD;  Location: [place];  Service: Ophthalmology;  Laterality: Right;  Diabetic - oral meds   COLONOSCOPY WITH PROPOFOL N/A 03/04/2016   Procedure: COLONOSCOPY WITH PROPOFOL;  Surgeon: Manya Silvas, MD;  Location: [facility] ENDOSCOPY;  Service: Endoscopy;  Laterality: N/A;   great toe amputation     matrixectomy     TONSILLECTOMY     Patient Active Problem List   Diagnosis Date Noted   Hypothyroidism  03/26/2022   UTI (urinary tract infection) 03/26/2022   Pneumonia due to COVID-19 virus 10/22/2020   Bipolar disorder ([place]) 10/22/2020   Acute encephalopathy 10/22/2020   Benign hypertensive kidney disease with chronic kidney disease 07/11/2019   Secondary hyperparathyroidism of renal origin ([place]) 07/11/2019   Class 1 obesity due to excess calories with serious comorbidity and body mass index (BMI) of 32.0 to 32.9 in adult 05/09/2019   Type 2 diabetes mellitus with stage 3 chronic kidney disease, without long-term current use of insulin ([place]) 09/16/2017   Stage 3 chronic kidney disease ([place]) 10/04/2016   Essential hypertriglyceridemia 03/22/2016   Essential hypertension 03/17/2016    ONSET DATE: 03/26/22  REFERRING DIAG: Shoulder pain, right; unspecified fx of upper end of right humerus, initial encounter for closed fx.  THERAPY DIAG:  Muscle weakness (generalized)  Right shoulder pain, unspecified chronicity  Stiffness of right shoulder, not elsewhere classified  Rationale for Evaluation and Treatment Rehabilitation  SUBJECTIVE:  SUBJECTIVE STATEMENT: "I've just been doing pendulum swings." Pt accompanied by: self  PERTINENT HISTORY: Per chart on 03/26/22, 61 y/o old female with PMH significant for bipolar disorder, anemia, cataracts, hyperlipidemia, hypertension, hypothyroidism, obesity, type 2 diabetes, CKD stage IIIb presented in the ED with c/o: generalized  weakness and recurrent falls. Patient has generalized weakness for some time which has gotten worse.  She had a fall yesterday and was evaluated outpatient and found to have right humeral fracture and was placed in a sling.  Patient had additional fall yesterday, denies any loss of consciousness or head trauma or head injury. In the ED work-up revealed UA positive for UTI.  Lactic acid normal.  x-ray right shoulder shows humeral neck fracture. ED physician spoke with orthopedics to discuss need for urgent evaluation.  Patient was  admitted for generalized weakness secondary to UTI,  started on IV antibiotics.  Orthopedics consulted,  recommended conservative management. Patient is not a candidate for surgical intervention.  Advised outpatient follow-up.  Patient completed antibiotics for 3 days. PT recommended  skilled nursing facility for rehab.  Patient feels better and want to be discharged.  Patient being discharged skilled nursing facility for rehab.  PRECAUTIONS: Per secure chat with orthopedist Dr. Sabra Heck on 8/15//23, ok to begin gentle ROM and pt may put some weight into the RUE.  WEIGHT BEARING RESTRICTIONS No  PAIN:  Are you having pain? Yes: Pain location: R shoulder 4/10 pain at rest, 6/10 pain with activity  Pain description: achy Aggravating factors: lifting up arm  Relieving factors: rest, tylenol   FALLS: Has patient fallen in last 6 months? Yes. Number of falls "a bunch"  LIVING ENVIRONMENT: Lives with: lives with their spouse Lives in: first floor apartment Stairs: No Has following equipment at home: Lobbyist, Environmental consultant - 2 wheeled, Wheelchair (manual), Shower bench, and bed side commode  PLOF: Independent with basic ADLs, spouse and pt shared IADL responsibilities  PATIENT GOALS  "I'd like to be able to use it the way I used to and hold it up all the way."  OBJECTIVE:  HAND DOMINANCE: Right  FUNCTIONAL OUTCOME MEASURES: FOTO: 28  UPPER EXTREMITY ROM     Active ROM Right eval Left Eval WNL  Shoulder flexion 20 (35)   Shoulder abduction 25 (35)         Shoulder adduction    Shoulder extension    Shoulder internal rotation R thumb to R upper buttock ~55   Shoulder external rotation 0 (10)   Elbow flexion WNL   Elbow extension WNL   (Blank rows = not tested)    UPPER EXTREMITY MMT:     MMT Right Eval NT Left Eval 5/5  Shoulder flexion    Shoulder abduction    Shoulder adduction    Shoulder extension    Shoulder internal rotation    Shoulder external rotation     Elbow flexion    Elbow extension    (Blank rows = not tested)  HAND FUNCTION: Grip strength: Right: 15 lbs; Left: 24 lbs, Lateral pinch: Right: 11 lbs, Left: 10 lbs, and 3 point pinch: Right: 7 lbs, Left: 6 lbs   TODAY'S TREATMENT:   Therapeutic Exercise: Moist heat to right shoulder in supine prior to shoulder exercises for 5 mins while therapist was performing AAROM to elbow, forearm, wrist and hand on right UE.  Pt seen for PROM of right shoulder for flexion, ABD, ADD, ER/IR followed by AAROM of each movement pattern.  Attempted place and hold of right shoulder in  90 degrees of flexion but pt unable to sustain position and required assist to stabilize from therapist.  Mild resistance for elbow flexion/extension, AROM of supination, pronation, wrist flex/ext, digit flexion/ext and opposition.   Reviewed pendulum exercises in standing as a warm up for ROM exercises for home program.  Pt will require assist for [facility]  exercises.     PATIENT EDUCATION: Education details: pendulum swing technique, PROM, AAROM of shoulder Person educated: Patient Education method: Explanation, Tactile cues, and Verbal cues Education comprehension: verbalized understanding, returned demonstration, verbal cues required, tactile cues required, and needs further education   HOME EXERCISE PROGRAM: Pendulum swings; will add on with additional visits   GOALS: Goals reviewed with patient? Yes  SHORT TERM GOALS: Target date: 07/13/22    Pt will be indep to perform HEP for increasing ROM to the RUE.  Baseline:  Requires vc and tactile cues for pendulum swings; will add on additonal exercises with future visits  Goal status: INITIAL   LONG TERM GOALS: Target date: 08/24/22    Pt will increase FOTO score to 50 or better to indicate improved functional performance with daily tasks. Baseline: 28 Goal status: INITIAL  2.  Pt will increase active shoulder flexion and abd to 70 degrees or better to increase  use of the RUE during UB ADLs. Baseline: active R shoulder flex 20, abd 25 Goal status: INITIAL  3.  Pt will increase active R shoulder ER to 30 degrees or better to enable brushing/combing R side of head with R dominant hand. Baseline: unable (active ER 0) Goal status: INITIAL  4.  Pt will increase R grip strength by 10 or more lbs to improve ability to hold and carry ADL supplies in R dominant hand without dropping. Baseline: R 15# (L 24#) Goal status: INITIAL   ASSESSMENT:  CLINICAL IMPRESSION: Pt performed well today with engaging in ROM exercises for right shoulder.  Tolerated passive ROM and was able to participate actively in AAROM to the shoulder.  She continues to demonstrate difficulty with initiating and movement through the complete range but made good effort and responded well to cues and guiding from therapist.  She demonstrated difficulty with place and hold of shoulder in 90 degrees of flexion in supine but was able to work towards control with descent of arm from this position.  Will continue to work towards goals to improve both active and passive motion of right shoulder, decrease pain and improve functional use for necessary daily tasks.    PERFORMANCE DEFICITS in functional skills including ADLs, IADLs, edema, ROM, strength, pain, flexibility, balance, body mechanics, decreased knowledge of precautions, decreased knowledge of use of DME, and UE functional use, cognitive skills including memory, and psychosocial skills including.   IMPAIRMENTS are limiting patient from ADLs, IADLs, and leisure.   COMORBIDITIES may have co-morbidities  that affects occupational performance. Patient will benefit from skilled OT to address above impairments and improve overall function.  MODIFICATION OR ASSISTANCE TO COMPLETE EVALUATION: Min-Moderate modification of tasks or assist with assess necessary to complete an evaluation.  OT OCCUPATIONAL PROFILE AND HISTORY: Problem focused  assessment: Including review of records relating to presenting problem.  CLINICAL DECISION MAKING: Moderate - several treatment options, min-mod task modification necessary  REHAB POTENTIAL: Good  EVALUATION COMPLEXITY: Moderate      PLAN: OT FREQUENCY: 2x/week  OT DURATION: 12 weeks  PLANNED INTERVENTIONS: self care/ADL training, therapeutic exercise, therapeutic activity, manual therapy, passive range of motion, balance training, moist heat, cryotherapy, patient/family education, energy conservation, and DME and/or AE instructions  RECOMMENDED OTHER SERVICES: N/A  CONSULTED AND AGREED WITH PLAN OF CARE: Patient  PLAN FOR NEXT SESSION: gentle PROM/HEP progression   Stacey Ball, OTR/L, CLT   Stacey Ball, OT 06/12/2022, 6:39 PM

## 2022-06-15 ENCOUNTER — Ambulatory Visit: Payer: PPO

## 2022-06-15 DIAGNOSIS — R2681 Unsteadiness on feet: Secondary | ICD-10-CM

## 2022-06-15 DIAGNOSIS — R278 Other lack of coordination: Secondary | ICD-10-CM

## 2022-06-15 DIAGNOSIS — M6281 Muscle weakness (generalized): Secondary | ICD-10-CM

## 2022-06-15 DIAGNOSIS — M25511 Pain in right shoulder: Secondary | ICD-10-CM

## 2022-06-15 DIAGNOSIS — M25611 Stiffness of right shoulder, not elsewhere classified: Secondary | ICD-10-CM | POA: Diagnosis not present

## 2022-06-15 NOTE — Therapy (Signed)
OUTPATIENT PHYSICAL THERAPY TREATMENT   Patient Name: Stacey Ball MRN: [id number] DOB:[birth year], 61 y.o., female Today's Date: 06/15/2022   PT End of Session - 06/15/22 1428     Visit Number 7    Number of Visits 24    Date for PT Re-Evaluation 08/10/22    PT Start Time 7893    PT Stop Time 1518    PT Time Calculation (min) 46 min    Equipment Utilized During Treatment Gait belt    Activity Tolerance Patient tolerated treatment well;No increased pain    Behavior During Therapy WFL for tasks assessed/performed   Verbose but pleasant                 Past Medical History:  Diagnosis Date   Anemia    HX of   Arthritis    Bipolar 1 disorder ([place])    Cancer (HCC)    skin cancer   Cataracts, bilateral    Diabetes mellitus without complication (HCC)    Dyspnea    on exertion (Pt states, "out of shape")   Function kidney decreased    High cholesterol    Hypertension    Hypothyroidism    Psoriasis    Psoriatic arthritis ([place])    Thyroid disease    Past Surgical History:  Procedure Laterality Date   CATARACT EXTRACTION W/PHACO Left 01/10/2020   Procedure: CATARACT EXTRACTION PHACO AND INTRAOCULAR LENS PLACEMENT ([place]) LEFT DIABETIC;  Surgeon: Marchia Meiers, MD;  Location: [place];  Service: Ophthalmology;  Laterality: Left;  5.32 0:41.5   CATARACT EXTRACTION W/PHACO Right 02/07/2020   Procedure: CATARACT EXTRACTION PHACO AND INTRAOCULAR LENS PLACEMENT ([place]) RIGHT DIABETIC VISION BLUE 4.01 00:27.4;  Surgeon: Marchia Meiers, MD;  Location: [place];  Service: Ophthalmology;  Laterality: Right;  Diabetic - oral meds   COLONOSCOPY WITH PROPOFOL N/A 03/04/2016   Procedure: COLONOSCOPY WITH PROPOFOL;  Surgeon: Manya Silvas, MD;  Location: [facility] ENDOSCOPY;  Service: Endoscopy;  Laterality: N/A;   great toe amputation     matrixectomy     TONSILLECTOMY     Patient Active Problem List   Diagnosis Date Noted   Hypothyroidism 03/26/2022   UTI  (urinary tract infection) 03/26/2022   Pneumonia due to COVID-19 virus 10/22/2020   Bipolar disorder ([place]) 10/22/2020   Acute encephalopathy 10/22/2020   Benign hypertensive kidney disease with chronic kidney disease 07/11/2019   Secondary hyperparathyroidism of renal origin ([place]) 07/11/2019   Class 1 obesity due to excess calories with serious comorbidity and body mass index (BMI) of 32.0 to 32.9 in adult 05/09/2019   Type 2 diabetes mellitus with stage 3 chronic kidney disease, without long-term current use of insulin ([place]) 09/16/2017   Stage 3 chronic kidney disease ([place]) 10/04/2016   Essential hypertriglyceridemia 03/22/2016   Essential hypertension 03/17/2016    PCP: Dion Body, MD  REFERRING PROVIDER: Mortimer Fries, PA-C  REFERRING DIAG: [phone] (ICD-10-CM) - Unspecified fracture of upper end of right humerus, initial encounter for closed fracture  THERAPY DIAG:  Muscle weakness (generalized)  Unsteadiness on feet  Other lack of coordination  Right shoulder pain, unspecified chronicity  Rationale for Evaluation and Treatment Rehabilitation  ONSET DATE: 03/25/22   SUBJECTIVE:  SUBJECTIVE STATEMENT: Pt reports some R shoulder pain and B knee pain (not new). Pt reports no falls but has felt wobbly. Reports HEP is going OK.  PAIN:  Are you having pain? Not rated/10 currently  PERTINENT HISTORY: Stacey Ball is a 61 y.o. here for an acute issue. She has a PMH of HTN, hypothyroidism, DM2, bipolar, CKD, HLD, Covid January 2022. She suffered a recent fall on 03/25/2022 landing on her right shoulder suffering a humerus fracture. She was hospitalized on 03/26/2022. She was transferred to rehab facility, [facility]. Now has returned home.     PRECAUTIONS: Shoulder no reaching,  lifting, reports ortho doc d/c sling as of 05/27/2022  WEIGHT BEARING RESTRICTIONS No  FALLS:  Has patient fallen in last 6 months? Yes. Number of falls 4 Once at [facility], once at night fall with walker, twice when alone at home.  LIVING ENVIRONMENT: Lives with: lives with their family and lives with their spouse Lives in: House/apartment Stairs: No Has following equipment at home: Quad cane small base, walker ( can't use due to shoulder) , shower chair, elevated toilet chair.   OCCUPATION: Retired, used to work part time ( 5 years ago)   PLOF: Independent with basic ADLs, Independent with household mobility with device, Independent with community mobility with device, Independent with homemaking with ambulation, Independent with gait, and pt reports her husband started doing all the chores which enabled her to become inactive   PATIENT GOALS Improve her shoulder function and improve her balance and mobility   OBJECTIVE:  All findings in objective taken at eval unless otherwise specified   DIAGNOSTIC FINDINGS:  There is a markedly comminuted and moderately displaced fracture of the right humeral surgical neck. On frontal view there appears to be approximately 1.5 cm fracture line craniocaudal diastasis. There is a cortical fragment measuring up to 2 cm that appears to be displaced medially 8 mm.   There is minimal varus angulation of the fracture. On transscapular Y-view there appears to be anterior angulation of the fracture and anterior displacement of the distal fracture component with respect to the proximal fracture component. There appears to be volume loss from a cortical fracture of the superolateral humeral head greater tuberosity.   The humeral head remains appropriately located with respect of the glenoid.   Mild acromioclavicular joint space narrowing and peripheral osteophytosis.   IMPRESSION: 1. Markedly comminuted and moderately displaced fracture of  the right humeral surgical neck. 2. Likely additional comminuted greater tuberosity of the humeral head fracture.  From imaging results of shoulder from pt's chart   UPPER EXTREMITY ROM:   Passive ROM Right eval Left eval  Shoulder flexion 45 WNL  Shoulder extension    Shoulder abduction 45 WNL  Shoulder adduction    Shoulder internal rotation 15 WNL  Shoulder external rotation 0 WNL  Elbow flexion    Elbow extension    Wrist flexion    Wrist extension    Wrist ulnar deviation    Wrist radial deviation    Wrist pronation    Wrist supination    (Blank rows = not tested) -significant UT involvement with testing R shoulder flexion   UPPER EXTREMITY MMT:  Unable to assess due to restriction for active motion  MMT Right eval Left eval  Shoulder flexion  WNL  Shoulder extension    Shoulder abduction  WNL  Shoulder adduction    Shoulder internal rotation  WNL  Shoulder external rotation  WNL  (Blank rows =  not tested)   TODAY'S TREATMENT:   06/15/22 Gait belt donned and CGA provided throughout unless otherwise specified   TherEx:  STS 1x10 with UUE assist, 1x5 hands-free.  Rates medium.  4# weights donned: Seated March 2x20 alt LE  LAQ 2x15 each LE  Seated DF 1x20 with 4# weights B   Seated PF 1x20 with 4# weights B  STS 5x hands free   Nustep level 1-3 x 6 min cues for 40 steps per minute, pt struggles to increase SPM, but able to hit 30s-39 @ lvl 2. Instructed to only use LUE and not RUE with intervention. Close CGA for mount/dismount.   NMR: Obstacle course: amb around cones and through // bars with SPC, close CGA, cuing throughout for increased step-length x multiple reps      In // bars FWD/BCKWD gait for step-length x multiple reps, cuing throughout to increase step-length, UUE support only with LUE  FWD/BCKWD in agility ladder 8x, LUE support on bar  Side-stepping in agility ladder 1x. Cuing for only LUE support on bar     PATIENT  EDUCATION: Education details: Pt educated throughout session about proper posture and technique with exercises. Improved exercise technique, movement at target joints, use of target muscles after min to mod verbal, visual, tactile cues.  Person educated: Patient Education method: Explanation, Demonstration, Tactile cues, and Verbal cues Education comprehension: verbalized understanding, returned demonstration, verbal cues required, tactile cues required, and needs further education   HOME EXERCISE PROGRAM:   No updates today, pt to continue HEP as previously given   05/20/2022: Access Code: [email or web address] URL: [email or web address] Date: 05/20/2022 Prepared by: Ricard Dillon  Exercises - Seated Toe Raise  - 1 x daily - 5 x weekly - 2 sets - 20 reps - 2 seconds hold - Seated Heel Raise  - 1 x daily - 5 x weekly - 2 sets - 20 reps - 2 seconds hold - Seated Long Arc Quad  - 1 x daily - 5 x weekly - 2 sets - 20 reps - 2 seconds hold - Seated March  - 1 x daily - 5 x weekly - 2 sets - 20 reps - 2 seconds hold    ASSESSMENT:  CLINICAL IMPRESSION: Focus this session was on improving dynamic balance and step-length with gait. Pt able to intermittently correct for increased step-length in session, but still requires multi-modal cues for technique. Pt showed progress with LE strength by performing multiple reps of STS hands-free. The pt will benefit from further skilled PT to improve strength, gait, balance and mobility to decrease fall risk and increase QOL.    OBJECTIVE IMPAIRMENTS decreased activity tolerance, decreased ROM, decreased strength, hypomobility, impaired flexibility, impaired UE functional use, and pain.   ACTIVITY LIMITATIONS carrying, lifting, bathing, dressing, and self feeding  PARTICIPATION LIMITATIONS: meal prep, cleaning, laundry, and community activity  PERSONAL FACTORS 3+ comorbidities: HTN, DM, HLD   are also affecting patient's functional outcome.   REHAB  POTENTIAL: Good  CLINICAL DECISION MAKING: Stable/uncomplicated  EVALUATION COMPLEXITY: Low   GOALS: Goals reviewed with patient? Yes  SHORT TERM GOALS: Target date: 06/15/2022   Patient will be independent in home exercise program to improve strength/mobility for better functional independence with ADLs. Baseline: No HEP currently  Goal status: INITIAL    LONG TERM GOALS: Target date:  08/10/2022 1.  Patient (> 67 years old) will complete five times sit to stand test in < 15 seconds indicating an increased LE strength and improved balance. Baseline: 8/3:  27 sec  Goal status: INITIAL  2.  Patient will increase FOTO score to equal to or greater than  57   to demonstrate statistically significant improvement in mobility and quality of life.  Baseline: 27 Goal status: INITIAL   3.  Patient will increase Berg Balance score by > 6 points to demonstrate decreased fall risk during functional activities. Baseline: 28/56  Goal status: INITIAL   4.   Patient will reduce timed up and go to <11 seconds to reduce fall risk and demonstrate improved transfer/gait ability. Baseline: 8/3: 75 seconds with SPC  Goal status: INITIAL  5.   Patient will improve R shoulder PROM to 90 degrees flexion and abduction and 30 degrees IR and IR in order to progress to functional shoulder use as tissues continue to heal  Baseline: see eval  Goal status: INITIAL   PLAN: PT FREQUENCY: 2x/week  PT DURATION: 12 weeks  PLANNED INTERVENTIONS: Therapeutic exercises, Therapeutic activity, Neuromuscular re-education, Balance training, Gait training, Patient/Family education, Self Care, Joint mobilization, Aquatic Therapy, Dry Needling, Electrical stimulation, Moist heat, Taping, Ultrasound, and Manual therapy  PLAN FOR NEXT SESSION:  LE strengthening, gait, balance, continue plan   Zollie Pee, PT 06/15/2022, 3:48 PM

## 2022-06-17 ENCOUNTER — Ambulatory Visit: Payer: PPO | Admitting: Occupational Therapy

## 2022-06-18 ENCOUNTER — Encounter: Payer: PPO | Admitting: Physical Therapy

## 2022-06-18 NOTE — Therapy (Signed)
OUTPATIENT PHYSICAL THERAPY TREATMENT   Patient Name: Stacey Ball MRN: [id number] DOB:[birth year], 61 y.o., female Today's Date: 06/22/2022   PT End of Session - 06/22/22 0806     Visit Number 8    Number of Visits 24    Date for PT Re-Evaluation 08/10/22    PT Start Time 0803    PT Stop Time 0844    PT Time Calculation (min) 41 min    Equipment Utilized During Treatment Gait belt    Activity Tolerance Patient tolerated treatment well;No increased pain    Behavior During Therapy WFL for tasks assessed/performed   Verbose but pleasant                  Past Medical History:  Diagnosis Date   Anemia    HX of   Arthritis    Bipolar 1 disorder ([place])    Cancer (HCC)    skin cancer   Cataracts, bilateral    Diabetes mellitus without complication (HCC)    Dyspnea    on exertion (Pt states, "out of shape")   Function kidney decreased    High cholesterol    Hypertension    Hypothyroidism    Psoriasis    Psoriatic arthritis ([place])    Thyroid disease    Past Surgical History:  Procedure Laterality Date   CATARACT EXTRACTION W/PHACO Left 01/10/2020   Procedure: CATARACT EXTRACTION PHACO AND INTRAOCULAR LENS PLACEMENT ([place]) LEFT DIABETIC;  Surgeon: Marchia Meiers, MD;  Location: [place];  Service: Ophthalmology;  Laterality: Left;  5.32 0:41.5   CATARACT EXTRACTION W/PHACO Right 02/07/2020   Procedure: CATARACT EXTRACTION PHACO AND INTRAOCULAR LENS PLACEMENT ([place]) RIGHT DIABETIC VISION BLUE 4.01 00:27.4;  Surgeon: Marchia Meiers, MD;  Location: [place];  Service: Ophthalmology;  Laterality: Right;  Diabetic - oral meds   COLONOSCOPY WITH PROPOFOL N/A 03/04/2016   Procedure: COLONOSCOPY WITH PROPOFOL;  Surgeon: Manya Silvas, MD;  Location: [facility] ENDOSCOPY;  Service: Endoscopy;  Laterality: N/A;   great toe amputation     matrixectomy     TONSILLECTOMY     Patient Active Problem List   Diagnosis Date Noted   Hypothyroidism 03/26/2022   UTI  (urinary tract infection) 03/26/2022   Pneumonia due to COVID-19 virus 10/22/2020   Bipolar disorder ([place]) 10/22/2020   Acute encephalopathy 10/22/2020   Benign hypertensive kidney disease with chronic kidney disease 07/11/2019   Secondary hyperparathyroidism of renal origin ([place]) 07/11/2019   Class 1 obesity due to excess calories with serious comorbidity and body mass index (BMI) of 32.0 to 32.9 in adult 05/09/2019   Type 2 diabetes mellitus with stage 3 chronic kidney disease, without long-term current use of insulin ([place]) 09/16/2017   Stage 3 chronic kidney disease ([place]) 10/04/2016   Essential hypertriglyceridemia 03/22/2016   Essential hypertension 03/17/2016    PCP: Dion Body, MD  REFERRING PROVIDER: Mortimer Fries, PA-C  REFERRING DIAG: [phone] (ICD-10-CM) - Unspecified fracture of upper end of right humerus, initial encounter for closed fracture  THERAPY DIAG:  Muscle weakness (generalized)  Unsteadiness on feet  Difficulty in walking, not elsewhere classified  Right shoulder pain, unspecified chronicity  Rationale for Evaluation and Treatment Rehabilitation  ONSET DATE: 03/25/22   SUBJECTIVE:  SUBJECTIVE STATEMENT: Pt reports RUE pain with movement, but no pain reported at rest. Pt reports no stumbles/falls. She reports no other concerns.   PAIN:  Are you having pain? Not rated/10 currently  PERTINENT HISTORY: Stacey Ball is a 61 y.o. here for an acute issue. She has a PMH of HTN, hypothyroidism, DM2, bipolar, CKD, HLD, Covid January 2022. She suffered a recent fall on 03/25/2022 landing on her right shoulder suffering a humerus fracture. She was hospitalized on 03/26/2022. She was transferred to rehab facility, [facility]. Now has returned home.     PRECAUTIONS:  Shoulder no reaching, lifting, reports ortho doc d/c sling as of 05/27/2022  WEIGHT BEARING RESTRICTIONS No  FALLS:  Has patient fallen in last 6 months? Yes. Number of falls 4 Once at [facility], once at night fall with walker, twice when alone at home.  LIVING ENVIRONMENT: Lives with: lives with their family and lives with their spouse Lives in: House/apartment Stairs: No Has following equipment at home: Quad cane small base, walker ( can't use due to shoulder) , shower chair, elevated toilet chair.   OCCUPATION: Retired, used to work part time ( 5 years ago)   PLOF: Independent with basic ADLs, Independent with household mobility with device, Independent with community mobility with device, Independent with homemaking with ambulation, Independent with gait, and pt reports her husband started doing all the chores which enabled her to become inactive   PATIENT GOALS Improve her shoulder function and improve her balance and mobility   OBJECTIVE:  All findings in objective taken at eval unless otherwise specified   DIAGNOSTIC FINDINGS:  There is a markedly comminuted and moderately displaced fracture of the right humeral surgical neck. On frontal view there appears to be approximately 1.5 cm fracture line craniocaudal diastasis. There is a cortical fragment measuring up to 2 cm that appears to be displaced medially 8 mm.   There is minimal varus angulation of the fracture. On transscapular Y-view there appears to be anterior angulation of the fracture and anterior displacement of the distal fracture component with respect to the proximal fracture component. There appears to be volume loss from a cortical fracture of the superolateral humeral head greater tuberosity.   The humeral head remains appropriately located with respect of the glenoid.   Mild acromioclavicular joint space narrowing and peripheral osteophytosis.   IMPRESSION: 1. Markedly comminuted and moderately  displaced fracture of the right humeral surgical neck. 2. Likely additional comminuted greater tuberosity of the humeral head fracture.  From imaging results of shoulder from pt's chart   UPPER EXTREMITY ROM:   Passive ROM Right eval Left eval  Shoulder flexion 45 WNL  Shoulder extension    Shoulder abduction 45 WNL  Shoulder adduction    Shoulder internal rotation 15 WNL  Shoulder external rotation 0 WNL  Elbow flexion    Elbow extension    Wrist flexion    Wrist extension    Wrist ulnar deviation    Wrist radial deviation    Wrist pronation    Wrist supination    (Blank rows = not tested) -significant UT involvement with testing R shoulder flexion   UPPER EXTREMITY MMT:  Unable to assess due to restriction for active motion  MMT Right eval Left eval  Shoulder flexion  WNL  Shoulder extension    Shoulder abduction  WNL  Shoulder adduction    Shoulder internal rotation  WNL  Shoulder external rotation  WNL  (Blank rows = not tested)  TODAY'S TREATMENT:   06/22/22 Gait belt donned and CGA provided throughout unless otherwise specified   TherEx:    Amb with SPC, close CGA for endurance and addition of improved step-length x 42 ft. Limited due to R knee pain that increased with ambulating but decreased with seated rest. Pt reports she felt pain in anterior-lateral knee, describes pain as dull.  5# weights donned: Seated March 2x20 alt LE. Rates difficult LAQ 2x15 each LE   Seated DF 2x20     Seated PF 2x20    Comments: no pain with seated therex  STS 2x5 hands-free   Nustep level 2-3 x 5 min cues for 40 steps per minute, pt struggles to increase SPM, PT provides encouragement/cues. Pt achieves approx 15-20 SPM. Instructed to only use LUE and not RUE with intervention. Min assist for mount/dismount.     PATIENT EDUCATION: Education details: Pt educated throughout session about proper posture and technique with exercises. Improved exercise technique,  movement at target joints, use of target muscles after min to mod verbal, visual, tactile cues.  Person educated: Patient Education method: Explanation, Demonstration, Tactile cues, and Verbal cues Education comprehension: verbalized understanding, returned demonstration, verbal cues required, tactile cues required, and needs further education   HOME EXERCISE PROGRAM:   No updates today, pt to continue HEP as previously given   05/20/2022: Access Code: [email or web address] URL: [email or web address] Date: 05/20/2022 Prepared by: Ricard Dillon  Exercises - Seated Toe Raise  - 1 x daily - 5 x weekly - 2 sets - 20 reps - 2 seconds hold - Seated Heel Raise  - 1 x daily - 5 x weekly - 2 sets - 20 reps - 2 seconds hold - Seated Long Arc Quad  - 1 x daily - 5 x weekly - 2 sets - 20 reps - 2 seconds hold - Seated March  - 1 x daily - 5 x weekly - 2 sets - 20 reps - 2 seconds hold    ASSESSMENT:  CLINICAL IMPRESSION: Pt with excellent motivation to participate in session although with more fatigue today. Ambulation somewhat limited today secondary to reports of R antero-lateral knee pain. This pain resolved immediately with rest. Will continue to monitor. Pt tolerated seated strengthening well without pain, and was even able to increase resistance used with seated therex, indicating improved BLE strength.The pt will benefit from further skilled PT to improve strength, gait, balance and mobility to decrease fall risk and increase QOL.    OBJECTIVE IMPAIRMENTS decreased activity tolerance, decreased ROM, decreased strength, hypomobility, impaired flexibility, impaired UE functional use, and pain.   ACTIVITY LIMITATIONS carrying, lifting, bathing, dressing, and self feeding  PARTICIPATION LIMITATIONS: meal prep, cleaning, laundry, and community activity  PERSONAL FACTORS 3+ comorbidities: HTN, DM, HLD   are also affecting patient's functional outcome.   REHAB POTENTIAL: Good  CLINICAL  DECISION MAKING: Stable/uncomplicated  EVALUATION COMPLEXITY: Low   GOALS: Goals reviewed with patient? Yes  SHORT TERM GOALS: Target date: 06/15/2022   Patient will be independent in home exercise program to improve strength/mobility for better functional independence with ADLs. Baseline: No HEP currently  Goal status: INITIAL    LONG TERM GOALS: Target date:  08/10/2022 1.  Patient (> 56 years old) will complete five times sit to stand test in < 15 seconds indicating an increased LE strength and improved balance. Baseline: 8/3: 27 sec  Goal status: INITIAL  2.  Patient will increase FOTO score to equal to or greater than  57  to demonstrate statistically significant improvement in mobility and quality of life.  Baseline: 27 Goal status: INITIAL   3.  Patient will increase Berg Balance score by > 6 points to demonstrate decreased fall risk during functional activities. Baseline: 28/56  Goal status: INITIAL   4.   Patient will reduce timed up and go to <11 seconds to reduce fall risk and demonstrate improved transfer/gait ability. Baseline: 8/3: 75 seconds with SPC  Goal status: INITIAL  5.   Patient will improve R shoulder PROM to 90 degrees flexion and abduction and 30 degrees IR and IR in order to progress to functional shoulder use as tissues continue to heal  Baseline: see eval  Goal status: INITIAL   PLAN: PT FREQUENCY: 2x/week  PT DURATION: 12 weeks  PLANNED INTERVENTIONS: Therapeutic exercises, Therapeutic activity, Neuromuscular re-education, Balance training, Gait training, Patient/Family education, Self Care, Joint mobilization, Aquatic Therapy, Dry Needling, Electrical stimulation, Moist heat, Taping, Ultrasound, and Manual therapy  PLAN FOR NEXT SESSION:  LE strengthening, gait, balance, continue plan   Zollie Pee, PT 06/22/2022, 8:44 AM

## 2022-06-22 ENCOUNTER — Ambulatory Visit: Payer: PPO | Attending: Physician Assistant

## 2022-06-22 DIAGNOSIS — R262 Difficulty in walking, not elsewhere classified: Secondary | ICD-10-CM

## 2022-06-22 DIAGNOSIS — M25611 Stiffness of right shoulder, not elsewhere classified: Secondary | ICD-10-CM | POA: Insufficient documentation

## 2022-06-22 DIAGNOSIS — M6281 Muscle weakness (generalized): Secondary | ICD-10-CM | POA: Diagnosis not present

## 2022-06-22 DIAGNOSIS — R278 Other lack of coordination: Secondary | ICD-10-CM | POA: Diagnosis not present

## 2022-06-22 DIAGNOSIS — R2681 Unsteadiness on feet: Secondary | ICD-10-CM

## 2022-06-22 DIAGNOSIS — M25511 Pain in right shoulder: Secondary | ICD-10-CM

## 2022-06-24 ENCOUNTER — Ambulatory Visit: Payer: PPO

## 2022-06-24 DIAGNOSIS — M25511 Pain in right shoulder: Secondary | ICD-10-CM

## 2022-06-24 DIAGNOSIS — M6281 Muscle weakness (generalized): Secondary | ICD-10-CM | POA: Diagnosis not present

## 2022-06-24 DIAGNOSIS — R262 Difficulty in walking, not elsewhere classified: Secondary | ICD-10-CM

## 2022-06-24 DIAGNOSIS — R2681 Unsteadiness on feet: Secondary | ICD-10-CM

## 2022-06-24 NOTE — Therapy (Signed)
OUTPATIENT PHYSICAL THERAPY TREATMENT   Patient Name: Stacey Ball MRN: [id number] DOB:07-Aug-1961, 61 y.o., female Today's Date: 06/24/2022   PT End of Session - 06/24/22 0810     Visit Number 9    Number of Visits 24    Date for PT Re-Evaluation 08/10/22    PT Start Time 0805    PT Stop Time 3710    PT Time Calculation (min) 39 min    Equipment Utilized During Treatment Gait belt    Activity Tolerance Patient tolerated treatment well;No increased pain    Behavior During Therapy WFL for tasks assessed/performed   Verbose but pleasant                   Past Medical History:  Diagnosis Date   Anemia    HX of   Arthritis    Bipolar 1 disorder ([place])    Cancer (HCC)    skin cancer   Cataracts, bilateral    Diabetes mellitus without complication (HCC)    Dyspnea    on exertion (Pt states, "out of shape")   Function kidney decreased    High cholesterol    Hypertension    Hypothyroidism    Psoriasis    Psoriatic arthritis ([place])    Thyroid disease    Past Surgical History:  Procedure Laterality Date   CATARACT EXTRACTION W/PHACO Left 01/10/2020   Procedure: CATARACT EXTRACTION PHACO AND INTRAOCULAR LENS PLACEMENT ([place]) LEFT DIABETIC;  Surgeon: Marchia Meiers, MD;  Location: [facility];  Service: Ophthalmology;  Laterality: Left;  5.32 0:41.5   CATARACT EXTRACTION W/PHACO Right 02/07/2020   Procedure: CATARACT EXTRACTION PHACO AND INTRAOCULAR LENS PLACEMENT ([place]) RIGHT DIABETIC VISION BLUE 4.01 00:27.4;  Surgeon: Marchia Meiers, MD;  Location: [facility];  Service: Ophthalmology;  Laterality: Right;  Diabetic - oral meds   COLONOSCOPY WITH PROPOFOL N/A 03/04/2016   Procedure: COLONOSCOPY WITH PROPOFOL;  Surgeon: Manya Silvas, MD;  Location: [facility];  Service: Endoscopy;  Laterality: N/A;   great toe amputation     matrixectomy     TONSILLECTOMY     Patient Active Problem List   Diagnosis Date Noted   Hypothyroidism 03/26/2022   UTI  (urinary tract infection) 03/26/2022   Pneumonia due to COVID-19 virus 10/22/2020   Bipolar disorder ([place]) 10/22/2020   Acute encephalopathy 10/22/2020   Benign hypertensive kidney disease with chronic kidney disease 07/11/2019   Secondary hyperparathyroidism of renal origin ([place]) 07/11/2019   Class 1 obesity due to excess calories with serious comorbidity and body mass index (BMI) of 32.0 to 32.9 in adult 05/09/2019   Type 2 diabetes mellitus with stage 3 chronic kidney disease, without long-term current use of insulin ([place]) 09/16/2017   Stage 3 chronic kidney disease ([place]) 10/04/2016   Essential hypertriglyceridemia 03/22/2016   Essential hypertension 03/17/2016    PCP: Dion Body, MD  REFERRING PROVIDER: Mortimer Fries, PA-C  REFERRING DIAG: [phone] (ICD-10-CM) - Unspecified fracture of upper end of right humerus, initial encounter for closed fracture  THERAPY DIAG:  Muscle weakness (generalized)  Difficulty in walking, not elsewhere classified  Unsteadiness on feet  Right shoulder pain, unspecified chronicity  Rationale for Evaluation and Treatment Rehabilitation  ONSET DATE: 03/25/22   SUBJECTIVE:  SUBJECTIVE STATEMENT: Pt reports her R shoulder is still sore. Pt reports no stumbles/falls. Has continued to ambulate in her home with her [facility].  PAIN:  Are you having pain? Not rated/10 currently  PERTINENT HISTORY: Stacey Ball is a 61 y.o. here for an acute issue. She has a PMH of HTN, hypothyroidism, DM2, bipolar, CKD, HLD, Covid January 2022. She suffered a recent fall on 03/25/2022 landing on her right shoulder suffering a humerus fracture. She was hospitalized on 03/26/2022. She was transferred to rehab facility, [facility]. Now has returned home.     PRECAUTIONS: Shoulder  no reaching, lifting, reports ortho doc d/c sling as of 05/27/2022  WEIGHT BEARING RESTRICTIONS No  FALLS:  Has patient fallen in last 6 months? Yes. Number of falls 4 Once at [facility], once at night fall with walker, twice when alone at home.  LIVING ENVIRONMENT: Lives with: lives with their family and lives with their spouse Lives in: House/apartment Stairs: No Has following equipment at home: Quad cane small base, walker ( can't use due to shoulder) , shower chair, elevated toilet chair.   OCCUPATION: Retired, used to work part time ( 5 years ago)   PLOF: Independent with basic ADLs, Independent with household mobility with device, Independent with community mobility with device, Independent with homemaking with ambulation, Independent with gait, and pt reports her husband started doing all the chores which enabled her to become inactive   PATIENT GOALS Improve her shoulder function and improve her balance and mobility   OBJECTIVE:  All findings in objective taken at eval unless otherwise specified   DIAGNOSTIC FINDINGS:  There is a markedly comminuted and moderately displaced fracture of the right humeral surgical neck. On frontal view there appears to be approximately 1.5 cm fracture line craniocaudal diastasis. There is a cortical fragment measuring up to 2 cm that appears to be displaced medially 8 mm.   There is minimal varus angulation of the fracture. On transscapular Y-view there appears to be anterior angulation of the fracture and anterior displacement of the distal fracture component with respect to the proximal fracture component. There appears to be volume loss from a cortical fracture of the superolateral humeral head greater tuberosity.   The humeral head remains appropriately located with respect of the glenoid.   Mild acromioclavicular joint space narrowing and peripheral osteophytosis.   IMPRESSION: 1. Markedly comminuted and moderately displaced  fracture of the right humeral surgical neck. 2. Likely additional comminuted greater tuberosity of the humeral head fracture.  From imaging results of shoulder from pt's chart   UPPER EXTREMITY ROM:   Passive ROM Right eval Left eval  Shoulder flexion 45 WNL  Shoulder extension    Shoulder abduction 45 WNL  Shoulder adduction    Shoulder internal rotation 15 WNL  Shoulder external rotation 0 WNL  Elbow flexion    Elbow extension    Wrist flexion    Wrist extension    Wrist ulnar deviation    Wrist radial deviation    Wrist pronation    Wrist supination    (Blank rows = not tested) -significant UT involvement with testing R shoulder flexion   UPPER EXTREMITY MMT:  Unable to assess due to restriction for active motion  MMT Right eval Left eval  Shoulder flexion  WNL  Shoulder extension    Shoulder abduction  WNL  Shoulder adduction    Shoulder internal rotation  WNL  Shoulder external rotation  WNL  (Blank rows = not tested)  TODAY'S TREATMENT:   06/24/22 Gait belt donned and CGA provided throughout unless otherwise specified   TherEx:    Nustep (seat 7) lvl 1 x 5 min, cuing for increased SPM to at least 40 or greater. Pt struggles to increase SPM above 24 today. Close CGA for mount/dismount.     Amb in // bars with BUE support FWD/BCKD x multiple reps length of bars. Cuing for increased step-length both ways. Pt exhibits improved ease with sequencing and step-length.  Progressed to performing with half-foam obstacle to clear x multiple reps - going FWD only with UUE support 8x. Rates medium-hard.  Seated DF 2x20 Rates easy+ Seated PF (with forefoot on half-foam to increase ROM) 2x20  STS 2x6 hands-free. Continued cues for technique. When pt applies technique she exhibits increased ease with completing intervention  5# weights donned: Seated March 1x20 alt LE.  LAQ 1x18, 1x8 each LE   Some mild discomfort felt in knees with nustep and ambulation.       PATIENT EDUCATION: Education details: Pt educated throughout session about proper posture and technique with exercises. Improved exercise technique, movement at target joints, use of target muscles after min to mod verbal, visual, tactile cues.  Person educated: Patient Education method: Explanation, Demonstration, Tactile cues, and Verbal cues Education comprehension: verbalized understanding, returned demonstration, verbal cues required, tactile cues required, and needs further education   HOME EXERCISE PROGRAM:   No updates today, pt to continue HEP as previously given   05/20/2022: Access Code: [email or web address] URL: [email or web address] Date: 05/20/2022 Prepared by: Ricard Dillon  Exercises - Seated Toe Raise  - 1 x daily - 5 x weekly - 2 sets - 20 reps - 2 seconds hold - Seated Heel Raise  - 1 x daily - 5 x weekly - 2 sets - 20 reps - 2 seconds hold - Seated Long Arc Quad  - 1 x daily - 5 x weekly - 2 sets - 20 reps - 2 seconds hold - Seated March  - 1 x daily - 5 x weekly - 2 sets - 20 reps - 2 seconds hold    ASSESSMENT:  CLINICAL IMPRESSION: Pt exhibits improved ease with increasing step-length with gait exercise today, indicating improve gait ability and LE strength. Based on today's performance with interventions pt ready to attempt standing versions of many seated therex. While she s shows progress, she still exhibits greatest difficulty with endurance-based exercises. The pt will benefit from further skilled PT to improve strength, gait, balance and mobility to decrease fall risk and increase QOL.    OBJECTIVE IMPAIRMENTS decreased activity tolerance, decreased ROM, decreased strength, hypomobility, impaired flexibility, impaired UE functional use, and pain.   ACTIVITY LIMITATIONS carrying, lifting, bathing, dressing, and self feeding  PARTICIPATION LIMITATIONS: meal prep, cleaning, laundry, and community activity  PERSONAL FACTORS 3+ comorbidities: HTN,  DM, HLD   are also affecting patient's functional outcome.   REHAB POTENTIAL: Good  CLINICAL DECISION MAKING: Stable/uncomplicated  EVALUATION COMPLEXITY: Low   GOALS: Goals reviewed with patient? Yes  SHORT TERM GOALS: Target date: 06/15/2022   Patient will be independent in home exercise program to improve strength/mobility for better functional independence with ADLs. Baseline: No HEP currently  Goal status: INITIAL    LONG TERM GOALS: Target date:  08/10/2022 1.  Patient (> 24 years old) will complete five times sit to stand test in < 15 seconds indicating an increased LE strength and improved balance. Baseline: 8/3: 27 sec  Goal status: INITIAL  2.  Patient will increase FOTO score to equal to or greater than  57   to demonstrate statistically significant improvement in mobility and quality of life.  Baseline: 27 Goal status: INITIAL   3.  Patient will increase Berg Balance score by > 6 points to demonstrate decreased fall risk during functional activities. Baseline: 28/56  Goal status: INITIAL   4.   Patient will reduce timed up and go to <11 seconds to reduce fall risk and demonstrate improved transfer/gait ability. Baseline: 8/3: 75 seconds with SPC  Goal status: INITIAL  5.   Patient will improve R shoulder PROM to 90 degrees flexion and abduction and 30 degrees IR and IR in order to progress to functional shoulder use as tissues continue to heal  Baseline: see eval  Goal status: INITIAL   PLAN: PT FREQUENCY: 2x/week  PT DURATION: 12 weeks  PLANNED INTERVENTIONS: Therapeutic exercises, Therapeutic activity, Neuromuscular re-education, Balance training, Gait training, Patient/Family education, Self Care, Joint mobilization, Aquatic Therapy, Dry Needling, Electrical stimulation, Moist heat, Taping, Ultrasound, and Manual therapy  PLAN FOR NEXT SESSION:  LE strengthening, gait, balance, continue plan   Zollie Pee, PT 06/24/2022, 8:57 AM

## 2022-06-29 ENCOUNTER — Ambulatory Visit: Payer: PPO

## 2022-07-01 ENCOUNTER — Ambulatory Visit: Payer: PPO | Admitting: Physical Therapy

## 2022-07-01 NOTE — Therapy (Deleted)
OUTPATIENT PHYSICAL THERAPY TREATMENT   Patient Name: Stacey Ball MRN: [id number] DOB:[birth year], 61 y.o., female Today's Date: 07/01/2022            Past Medical History:  Diagnosis Date   Anemia    HX of   Arthritis    Bipolar 1 disorder ([place])    Cancer ([place])    skin cancer   Cataracts, bilateral    Diabetes mellitus without complication (HCC)    Dyspnea    on exertion (Pt states, "out of shape")   Function kidney decreased    High cholesterol    Hypertension    Hypothyroidism    Psoriasis    Psoriatic arthritis ([place])    Thyroid disease    Past Surgical History:  Procedure Laterality Date   CATARACT EXTRACTION W/PHACO Left 01/10/2020   Procedure: CATARACT EXTRACTION PHACO AND INTRAOCULAR LENS PLACEMENT ([place]) LEFT DIABETIC;  Surgeon: Stacey Meiers, MD;  Location: [facility];  Service: Ophthalmology;  Laterality: Left;  5.32 0:41.5   CATARACT EXTRACTION W/PHACO Right 02/07/2020   Procedure: CATARACT EXTRACTION PHACO AND INTRAOCULAR LENS PLACEMENT ([place]) RIGHT DIABETIC VISION BLUE 4.01 00:27.4;  Surgeon: Stacey Meiers, MD;  Location: [place];  Service: Ophthalmology;  Laterality: Right;  Diabetic - oral meds   COLONOSCOPY WITH PROPOFOL N/A 03/04/2016   Procedure: COLONOSCOPY WITH PROPOFOL;  Surgeon: Stacey Silvas, MD;  Location: [facility] ENDOSCOPY;  Service: Endoscopy;  Laterality: N/A;   great toe amputation     matrixectomy     TONSILLECTOMY     Patient Active Problem List   Diagnosis Date Noted   Hypothyroidism 03/26/2022   UTI (urinary tract infection) 03/26/2022   Pneumonia due to COVID-19 virus 10/22/2020   Bipolar disorder ([place]) 10/22/2020   Acute encephalopathy 10/22/2020   Benign hypertensive kidney disease with chronic kidney disease 07/11/2019   Secondary hyperparathyroidism of renal origin ([place]) 07/11/2019   Class 1 obesity due to excess calories with serious comorbidity and Ball mass index (BMI) of 32.0 to 32.9 in adult  05/09/2019   Type 2 diabetes mellitus with stage 3 chronic kidney disease, without long-term current use of insulin ([place]) 09/16/2017   Stage 3 chronic kidney disease ([place]) 10/04/2016   Essential hypertriglyceridemia 03/22/2016   Essential hypertension 03/17/2016    PCP: Stacey Body, MD  REFERRING PROVIDER: Mortimer Fries, PA-C  REFERRING DIAG: [phone] (ICD-10-CM) - Unspecified fracture of upper end of right humerus, initial encounter for closed fracture  THERAPY DIAG:  No diagnosis found.  Rationale for Evaluation and Treatment Rehabilitation  ONSET DATE: 03/25/22   SUBJECTIVE:  SUBJECTIVE STATEMENT: Pt reports her R shoulder is still sore. Pt reports no stumbles/falls. Has continued to ambulate in her home with her [facility].  PAIN:  Are you having pain? Not rated/10 currently  PERTINENT HISTORY: Stacey Ball is a 61 y.o. here for an acute issue. She has a PMH of HTN, hypothyroidism, DM2, bipolar, CKD, HLD, Covid January 2022. She suffered a recent fall on 03/25/2022 landing on her right shoulder suffering a humerus fracture. She was hospitalized on 03/26/2022. She was transferred to rehab facility, [facility]. Now has returned home.     PRECAUTIONS: Shoulder no reaching, lifting, reports ortho doc d/c sling as of 05/27/2022  WEIGHT BEARING RESTRICTIONS No  FALLS:  Has patient fallen in last 6 months? Yes. Number of falls 4 Once at [facility], once at night fall with walker, twice when alone at home.  LIVING ENVIRONMENT: Lives with: lives with their family and lives with their spouse Lives in: House/apartment Stairs: No Has following equipment at home: Quad cane small base, walker ( can't use due to shoulder) , shower chair, elevated toilet chair.   OCCUPATION: Retired, used to work  part time ( 5 years ago)   PLOF: Independent with basic ADLs, Independent with household mobility with device, Independent with community mobility with device, Independent with homemaking with ambulation, Independent with gait, and pt reports her husband started doing all the chores which enabled her to become inactive   PATIENT GOALS Improve her shoulder function and improve her balance and mobility   OBJECTIVE:  All findings in objective taken at eval unless otherwise specified   DIAGNOSTIC FINDINGS:  There is a markedly comminuted and moderately displaced fracture of the right humeral surgical neck. On frontal view there appears to be approximately 1.5 cm fracture line craniocaudal diastasis. There is a cortical fragment measuring up to 2 cm that appears to be displaced medially 8 mm.   There is minimal varus angulation of the fracture. On transscapular Y-view there appears to be anterior angulation of the fracture and anterior displacement of the distal fracture component with respect to the proximal fracture component. There appears to be volume loss from a cortical fracture of the superolateral humeral head greater tuberosity.   The humeral head remains appropriately located with respect of the glenoid.   Mild acromioclavicular joint space narrowing and peripheral osteophytosis.   IMPRESSION: 1. Markedly comminuted and moderately displaced fracture of the right humeral surgical neck. 2. Likely additional comminuted greater tuberosity of the humeral head fracture.  From imaging results of shoulder from pt's chart   UPPER EXTREMITY ROM:   Passive ROM Right eval Left eval  Shoulder flexion 45 WNL  Shoulder extension    Shoulder abduction 45 WNL  Shoulder adduction    Shoulder internal rotation 15 WNL  Shoulder external rotation 0 WNL  Elbow flexion    Elbow extension    Wrist flexion    Wrist extension    Wrist ulnar deviation    Wrist radial deviation     Wrist pronation    Wrist supination    (Blank rows = not tested) -significant UT involvement with testing R shoulder flexion   UPPER EXTREMITY MMT:  Unable to assess due to restriction for active motion  MMT Right eval Left eval  Shoulder flexion  WNL  Shoulder extension    Shoulder abduction  WNL  Shoulder adduction    Shoulder internal rotation  WNL  Shoulder external rotation  WNL  (Blank rows = not tested)  TODAY'S TREATMENT:   07/01/22 Gait belt donned and CGA provided throughout unless otherwise specified   TherEx:    Nustep (seat 7) lvl 1 x 5 min, cuing for increased SPM to at least 40 or greater. Pt struggles to increase SPM above 24 today. Close CGA for mount/dismount.    Amb in // bars with BUE support FWD/BCKD x multiple reps length of bars. Cuing for increased step-length both ways. Pt exhibits improved ease with sequencing and step-length.  Progressed to performing with half-foam obstacle to clear x multiple reps - going FWD only with UUE support 8x. Rates medium-hard.  Seated DF 2x20 Rates easy+ Seated PF (with forefoot on half-foam to increase ROM) 2x20  Sidestepping in // bars  NBOS on airex   STS 2x6 hands-free. Continued cues for technique. When pt applies technique she exhibits increased ease with completing intervention  5# weights donned: Seated March 1x20 alt LE.  LAQ 1x18, 1x8 each LE   Some mild discomfort felt in knees with nustep and ambulation.      PATIENT EDUCATION: Education details: Pt educated throughout session about proper posture and technique with exercises. Improved exercise technique, movement at target joints, use of target muscles after min to mod verbal, visual, tactile cues.  Person educated: Patient Education method: Explanation, Demonstration, Tactile cues, and Verbal cues Education comprehension: verbalized understanding, returned demonstration, verbal cues required, tactile cues required, and needs further  education   HOME EXERCISE PROGRAM:   No updates today, pt to continue HEP as previously given   05/20/2022: Access Code: [email or web address] URL: [email or web address] Date: 05/20/2022 Prepared by: Ricard Dillon  Exercises - Seated Toe Raise  - 1 x daily - 5 x weekly - 2 sets - 20 reps - 2 seconds hold - Seated Heel Raise  - 1 x daily - 5 x weekly - 2 sets - 20 reps - 2 seconds hold - Seated Long Arc Quad  - 1 x daily - 5 x weekly - 2 sets - 20 reps - 2 seconds hold - Seated March  - 1 x daily - 5 x weekly - 2 sets - 20 reps - 2 seconds hold    ASSESSMENT:  CLINICAL IMPRESSION: Pt exhibits improved ease with increasing step-length with gait exercise today, indicating improve gait ability and LE strength. Based on today's performance with interventions pt ready to attempt standing versions of many seated therex. While she s shows progress, she still exhibits greatest difficulty with endurance-based exercises. The pt will benefit from further skilled PT to improve strength, gait, balance and mobility to decrease fall risk and increase QOL.    OBJECTIVE IMPAIRMENTS decreased activity tolerance, decreased ROM, decreased strength, hypomobility, impaired flexibility, impaired UE functional use, and pain.   ACTIVITY LIMITATIONS carrying, lifting, bathing, dressing, and self feeding  PARTICIPATION LIMITATIONS: meal prep, cleaning, laundry, and community activity  PERSONAL FACTORS 3+ comorbidities: HTN, DM, HLD   are also affecting patient's functional outcome.   REHAB POTENTIAL: Good  CLINICAL DECISION MAKING: Stable/uncomplicated  EVALUATION COMPLEXITY: Low   GOALS: Goals reviewed with patient? Yes  SHORT TERM GOALS: Target date: 06/15/2022   Patient will be independent in home exercise program to improve strength/mobility for better functional independence with ADLs. Baseline: No HEP currently  Goal status: INITIAL    LONG TERM GOALS: Target date:  08/10/2022 1.   Patient (> 42 years old) will complete five times sit to stand test in < 15 seconds indicating an increased LE strength and improved balance. Baseline: 8/3:  27 sec  Goal status: INITIAL  2.  Patient will increase FOTO score to equal to or greater than  57   to demonstrate statistically significant improvement in mobility and quality of life.  Baseline: 27 Goal status: INITIAL   3.  Patient will increase Berg Balance score by > 6 points to demonstrate decreased fall risk during functional activities. Baseline: 28/56  Goal status: INITIAL   4.   Patient will reduce timed up and go to <11 seconds to reduce fall risk and demonstrate improved transfer/gait ability. Baseline: 8/3: 75 seconds with SPC  Goal status: INITIAL  5.   Patient will improve R shoulder PROM to 90 degrees flexion and abduction and 30 degrees IR and IR in order to progress to functional shoulder use as tissues continue to heal  Baseline: see eval  Goal status: INITIAL   PLAN: PT FREQUENCY: 2x/week  PT DURATION: 12 weeks  PLANNED INTERVENTIONS: Therapeutic exercises, Therapeutic activity, Neuromuscular re-education, Balance training, Gait training, Patient/Family education, Self Care, Joint mobilization, Aquatic Therapy, Dry Needling, Electrical stimulation, Moist heat, Taping, Ultrasound, and Manual therapy  PLAN FOR NEXT SESSION:  LE strengthening, gait, balance, continue plan   Particia Lather, PT 07/01/2022, 8:25 AM

## 2022-07-02 ENCOUNTER — Encounter: Payer: PPO | Admitting: Physical Therapy

## 2022-07-06 ENCOUNTER — Ambulatory Visit: Payer: PPO

## 2022-07-06 ENCOUNTER — Ambulatory Visit: Payer: PPO | Admitting: Occupational Therapy

## 2022-07-06 DIAGNOSIS — R2681 Unsteadiness on feet: Secondary | ICD-10-CM

## 2022-07-06 DIAGNOSIS — R278 Other lack of coordination: Secondary | ICD-10-CM

## 2022-07-06 DIAGNOSIS — M6281 Muscle weakness (generalized): Secondary | ICD-10-CM | POA: Diagnosis not present

## 2022-07-06 DIAGNOSIS — M25511 Pain in right shoulder: Secondary | ICD-10-CM

## 2022-07-06 DIAGNOSIS — R262 Difficulty in walking, not elsewhere classified: Secondary | ICD-10-CM

## 2022-07-06 NOTE — Therapy (Signed)
OUTPATIENT PHYSICAL THERAPY TREATMENT/Physical Therapy Progress Note   Dates of reporting period  05/18/2022   to   07/06/2022     Patient Name: Stacey Ball MRN: [id number] DOB:22-Dec-1960, 61 y.o., female Today's Date: 07/06/2022   PT End of Session - 07/06/22 0930     Visit Number 10    Number of Visits 24    Date for PT Re-Evaluation 08/10/22    PT Start Time 0931    PT Stop Time 1014    PT Time Calculation (min) 43 min    Equipment Utilized During Treatment Gait belt    Activity Tolerance Patient tolerated treatment well;No increased pain    Behavior During Therapy WFL for tasks assessed/performed   Verbose but pleasant                    Past Medical History:  Diagnosis Date   Anemia    HX of   Arthritis    Bipolar 1 disorder ([place])    Cancer (HCC)    skin cancer   Cataracts, bilateral    Diabetes mellitus without complication (HCC)    Dyspnea    on exertion (Pt states, "out of shape")   Function kidney decreased    High cholesterol    Hypertension    Hypothyroidism    Psoriasis    Psoriatic arthritis ([place])    Thyroid disease    Past Surgical History:  Procedure Laterality Date   CATARACT EXTRACTION W/PHACO Left 01/10/2020   Procedure: CATARACT EXTRACTION PHACO AND INTRAOCULAR LENS PLACEMENT ([place]) LEFT DIABETIC;  Surgeon: Stacey Meiers, MD;  Location: [facility];  Service: Ophthalmology;  Laterality: Left;  5.32 0:41.5   CATARACT EXTRACTION W/PHACO Right 02/07/2020   Procedure: CATARACT EXTRACTION PHACO AND INTRAOCULAR LENS PLACEMENT ([place]) RIGHT DIABETIC VISION BLUE 4.01 00:27.4;  Surgeon: Stacey Meiers, MD;  Location: [facility];  Service: Ophthalmology;  Laterality: Right;  Diabetic - oral meds   COLONOSCOPY WITH PROPOFOL N/A 03/04/2016   Procedure: COLONOSCOPY WITH PROPOFOL;  Surgeon: Stacey Silvas, MD;  Location: [facility];  Service: Endoscopy;  Laterality: N/A;   great toe amputation     matrixectomy      TONSILLECTOMY     Patient Active Problem List   Diagnosis Date Noted   Hypothyroidism 03/26/2022   UTI (urinary tract infection) 03/26/2022   Pneumonia due to COVID-19 virus 10/22/2020   Bipolar disorder ([place]) 10/22/2020   Acute encephalopathy 10/22/2020   Benign hypertensive kidney disease with chronic kidney disease 07/11/2019   Secondary hyperparathyroidism of renal origin ([place]) 07/11/2019   Class 1 obesity due to excess calories with serious comorbidity and Ball mass index (BMI) of 32.0 to 32.9 in adult 05/09/2019   Type 2 diabetes mellitus with stage 3 chronic kidney disease, without long-term current use of insulin ([place]) 09/16/2017   Stage 3 chronic kidney disease ([place]) 10/04/2016   Essential hypertriglyceridemia 03/22/2016   Essential hypertension 03/17/2016    PCP: Stacey Body, MD  REFERRING PROVIDER: Mortimer Fries, PA-C  REFERRING DIAG: [phone] (ICD-10-CM) - Unspecified fracture of upper end of right humerus, initial encounter for closed fracture  THERAPY DIAG:  Unsteadiness on feet  Difficulty in walking, not elsewhere classified  Muscle weakness (generalized)  Rationale for Evaluation and Treatment Rehabilitation  ONSET DATE: 03/25/22   SUBJECTIVE:  SUBJECTIVE STATEMENT:  Pt reports "my shoulder is always hurting" Rates shoulder pain 6/10. Rates knee pain 7/10. Reports no other updates.  PAIN:  Are you having pain? See above  PERTINENT HISTORY: Stacey Ball is a 61 y.o. here for an acute issue. She has a PMH of HTN, hypothyroidism, DM2, bipolar, CKD, HLD, Covid January 2022. She suffered a recent fall on 03/25/2022 landing on her right shoulder suffering a humerus fracture. She was hospitalized on 03/26/2022. She was transferred to rehab facility, [facility]. Now has  returned home.     PRECAUTIONS: Shoulder no reaching, lifting, reports ortho doc d/c sling as of 05/27/2022  WEIGHT BEARING RESTRICTIONS No  FALLS:  Has patient fallen in last 6 months? Yes. Number of falls 4 Once at [facility], once at night fall with walker, twice when alone at home.  LIVING ENVIRONMENT: Lives with: lives with their family and lives with their spouse Lives in: House/apartment Stairs: No Has following equipment at home: Quad cane small base, walker ( can't use due to shoulder) , shower chair, elevated toilet chair.   OCCUPATION: Retired, used to work part time ( 5 years ago)   PLOF: Independent with basic ADLs, Independent with household mobility with device, Independent with community mobility with device, Independent with homemaking with ambulation, Independent with gait, and pt reports her husband started doing all the chores which enabled her to become inactive   PATIENT GOALS Improve her shoulder function and improve her balance and mobility   OBJECTIVE:  All findings in objective taken at eval unless otherwise specified   DIAGNOSTIC FINDINGS:  There is a markedly comminuted and moderately displaced fracture of the right humeral surgical neck. On frontal view there appears to be approximately 1.5 cm fracture line craniocaudal diastasis. There is a cortical fragment measuring up to 2 cm that appears to be displaced medially 8 mm.   There is minimal varus angulation of the fracture. On transscapular Y-view there appears to be anterior angulation of the fracture and anterior displacement of the distal fracture component with respect to the proximal fracture component. There appears to be volume loss from a cortical fracture of the superolateral humeral head greater tuberosity.   The humeral head remains appropriately located with respect of the glenoid.   Mild acromioclavicular joint space narrowing and peripheral osteophytosis.   IMPRESSION: 1.  Markedly comminuted and moderately displaced fracture of the right humeral surgical neck. 2. Likely additional comminuted greater tuberosity of the humeral head fracture.  From imaging results of shoulder from pt's chart   UPPER EXTREMITY ROM:   Passive ROM Right eval Left eval  Shoulder flexion 45 WNL  Shoulder extension    Shoulder abduction 45 WNL  Shoulder adduction    Shoulder internal rotation 15 WNL  Shoulder external rotation 0 WNL  Elbow flexion    Elbow extension    Wrist flexion    Wrist extension    Wrist ulnar deviation    Wrist radial deviation    Wrist pronation    Wrist supination    (Blank rows = not tested) -significant UT involvement with testing R shoulder flexion   UPPER EXTREMITY MMT:  Unable to assess due to restriction for active motion  MMT Right eval Left eval  Shoulder flexion  WNL  Shoulder extension    Shoulder abduction  WNL  Shoulder adduction    Shoulder internal rotation  WNL  Shoulder external rotation  WNL  (Blank rows = not tested)   TODAY'S TREATMENT:  07/06/22  Gait belt donned and CGA provided throughout unless otherwise specified    TherACT Goals reassessed for progress note. PT instructed pt in technique with testing and results of goal performance. Please refer to goal section below for details.   PT recommends to pt she follow-up again with neurologist due to gait impairments observed over reporting period in PT including shuffling gait, decreased step-length, pt additionally mentions memory impairment.      PATIENT EDUCATION: Education details: Pt educated throughout session about proper posture and technique with exercises. Improved exercise technique, movement at target joints, use of target muscles after min to mod verbal, visual, tactile cues. Goals, plan.  Person educated: Patient Education method: Explanation, Demonstration, Tactile cues, and Verbal cues Education comprehension: verbalized understanding,  returned demonstration, verbal cues required, tactile cues required, and needs further education   HOME EXERCISE PROGRAM:   No updates today, pt to continue HEP as previously given   05/20/2022: Access Code: [email or web address] URL: [email or web address] Date: 05/20/2022 Prepared by: Ricard Dillon  Exercises - Seated Toe Raise  - 1 x daily - 5 x weekly - 2 sets - 20 reps - 2 seconds hold - Seated Heel Raise  - 1 x daily - 5 x weekly - 2 sets - 20 reps - 2 seconds hold - Seated Long Arc Quad  - 1 x daily - 5 x weekly - 2 sets - 20 reps - 2 seconds hold - Seated March  - 1 x daily - 5 x weekly - 2 sets - 20 reps - 2 seconds hold    ASSESSMENT:  CLINICAL IMPRESSION:= Goal assessment completed for progress note. Pt making gains AEB improvements on TUG, 5xSTS, FOTO, indicating slight decrease in fall risk, improvement BLE strength/power, and perceived improvement in functional mobility. While pt making gains, she has not yet met goals and is still at an increased risk for future falls. Due to consistently observed shuffling gait PT recommends that pt follow-up with a neurologist.  The pt will benefit from further skilled PT to improve strength, gait, balance and mobility to decrease fall risk and increase QOL.    OBJECTIVE IMPAIRMENTS decreased activity tolerance, decreased ROM, decreased strength, hypomobility, impaired flexibility, impaired UE functional use, and pain.   ACTIVITY LIMITATIONS carrying, lifting, bathing, dressing, and self feeding  PARTICIPATION LIMITATIONS: meal prep, cleaning, laundry, and community activity  PERSONAL FACTORS 3+ comorbidities: HTN, DM, HLD   are also affecting patient's functional outcome.   REHAB POTENTIAL: Good  CLINICAL DECISION MAKING: Stable/uncomplicated  EVALUATION COMPLEXITY: Low   GOALS: Goals reviewed with patient? Yes  SHORT TERM GOALS: Target date: 06/15/2022   Patient will be independent in home exercise program to improve  strength/mobility for better functional independence with ADLs. Baseline: No HEP currently; 9/19: Pt reports she does not yet feel fully independent.  Goal status: ONGOING    LONG TERM GOALS: Target date:  08/10/2022 1.  Patient (> 70 years old) will complete five times sit to stand test in < 15 seconds indicating an increased LE strength and improved balance. Baseline: 8/3: 27 sec; 9/19: 21 sec UUE assist off chair Goal status: Ongoing  2.  Patient will increase FOTO score to equal to or greater than  53   to demonstrate statistically significant improvement in mobility and quality of life.  Baseline: 9/19: 47 Goal status: REVISED to reflect LE instead of shoulder (being seen by OT for shoulder)   3.  Patient will increase Berg Balance score by > 6 points  to demonstrate decreased fall risk during functional activities. Baseline: 28/56 ; 9/19: deferred due to time limitations Goal status: INITIAL   4.   Patient will reduce timed up and go to <11 seconds to reduce fall risk and demonstrate improved transfer/gait ability. Baseline: 8/3: 75 seconds with SPC; 9/19: 67 sec with SPC  Goal status: INITIAL  5.   Patient will improve R shoulder PROM to 90 degrees flexion and abduction and 30 degrees IR and IR in order to progress to functional shoulder use as tissues continue to heal  Baseline: see eval; 9/19: GOAL DISCONTINUED Goal status: Discontinued as pt now being seen in OT for shoulder, being treated for LE/balance in PT   PLAN: PT FREQUENCY: 2x/week  PT DURATION: 12 weeks  PLANNED INTERVENTIONS: Therapeutic exercises, Therapeutic activity, Neuromuscular re-education, Balance training, Gait training, Patient/Family education, Self Care, Joint mobilization, Aquatic Therapy, Dry Needling, Electrical stimulation, Moist heat, Taping, Ultrasound, and Manual therapy  PLAN FOR NEXT SESSION:  LE strengthening, gait, balance, continue plan   Zollie Pee, PT 07/06/2022, 5:07 PM

## 2022-07-08 ENCOUNTER — Ambulatory Visit: Payer: PPO | Admitting: Physical Therapy

## 2022-07-08 ENCOUNTER — Ambulatory Visit: Payer: PPO

## 2022-07-08 DIAGNOSIS — S42201A Unspecified fracture of upper end of right humerus, initial encounter for closed fracture: Secondary | ICD-10-CM | POA: Diagnosis not present

## 2022-07-08 DIAGNOSIS — M25511 Pain in right shoulder: Secondary | ICD-10-CM

## 2022-07-08 DIAGNOSIS — M6281 Muscle weakness (generalized): Secondary | ICD-10-CM

## 2022-07-08 DIAGNOSIS — M25611 Stiffness of right shoulder, not elsewhere classified: Secondary | ICD-10-CM

## 2022-07-09 ENCOUNTER — Encounter: Payer: Self-pay | Admitting: Occupational Therapy

## 2022-07-09 NOTE — Therapy (Signed)
OUTPATIENT OCCUPATIONAL THERAPY ORTHO TREATMENT  Patient Name: Stacey Ball MRN: [id number] DOB:12-14-1960, 61 y.o., female Today's Date: 07/06/2022  PCP: Dr. Netty Starring REFERRING PROVIDER: Mortimer Fries, PA  OT End of Session - 07/09/22 1039     Visit Number 3    Number of Visits 24    Date for OT Re-Evaluation 08/24/22    OT Start Time 1045    OT Stop Time 1129    OT Time Calculation (min) 44 min    Activity Tolerance Patient tolerated treatment well    Behavior During Therapy WFL for tasks assessed/performed   Verbose but pleasant            Past Medical History:  Diagnosis Date   Anemia    HX of   Arthritis    Bipolar 1 disorder ([place])    Cancer ([place])    skin cancer   Cataracts, bilateral    Diabetes mellitus without complication ([place])    Dyspnea    on exertion (Pt states, "out of shape")   Function kidney decreased    High cholesterol    Hypertension    Hypothyroidism    Psoriasis    Psoriatic arthritis ([place])    Thyroid disease    Past Surgical History:  Procedure Laterality Date   CATARACT EXTRACTION W/PHACO Left 01/10/2020   Procedure: CATARACT EXTRACTION PHACO AND INTRAOCULAR LENS PLACEMENT ([place]) LEFT DIABETIC;  Surgeon: Marchia Meiers, MD;  Location: [facility];  Service: Ophthalmology;  Laterality: Left;  5.32 0:41.5   CATARACT EXTRACTION W/PHACO Right 02/07/2020   Procedure: CATARACT EXTRACTION PHACO AND INTRAOCULAR LENS PLACEMENT ([place]) RIGHT DIABETIC VISION BLUE 4.01 00:27.4;  Surgeon: Marchia Meiers, MD;  Location: [facility];  Service: Ophthalmology;  Laterality: Right;  Diabetic - oral meds   COLONOSCOPY WITH PROPOFOL N/A 03/04/2016   Procedure: COLONOSCOPY WITH PROPOFOL;  Surgeon: Manya Silvas, MD;  Location: [facility] END[facility];  Service: Endoscopy;  Laterality: N/A;   great toe amputation     matrixectomy     TONSILLECTOMY     Patient Active Problem List   Diagnosis Date Noted   Hypothyroidism 03/26/2022   UTI (urinary tract  infection) 03/26/2022   Pneumonia due to COVID-19 virus 10/22/2020   Bipolar disorder ([place]) 10/22/2020   Acute encephalopathy 10/22/2020   Benign hypertensive kidney disease with chronic kidney disease 07/11/2019   Secondary hyperparathyroidism of renal origin ([place]) 07/11/2019   Class 1 obesity due to excess calories with serious comorbidity and body mass index (BMI) of 32.0 to 32.9 in adult 05/09/2019   Type 2 diabetes mellitus with stage 3 chronic kidney disease, without long-term current use of insulin ([place]) 09/16/2017   Stage 3 chronic kidney disease ([place]) 10/04/2016   Essential hypertriglyceridemia 03/22/2016   Essential hypertension 03/17/2016    ONSET DATE: 03/26/22  REFERRING DIAG: Shoulder pain, right; unspecified fx of upper end of right humerus, initial encounter for closed fx.  THERAPY DIAG:  Muscle weakness (generalized)  Other lack of coordination  Right shoulder pain, unspecified chronicity  Unsteadiness on feet  Rationale for Evaluation and Treatment Rehabilitation  SUBJECTIVE:  SUBJECTIVE STATEMENT: Pt reports she has been trying to move her arm more at home, engaging in ROM exercises but reports it is still hard to raise the arm.    Pt accompanied by: self  PERTINENT HISTORY: Per chart on 03/26/22, 61 y/o old female with PMH significant for bipolar disorder, anemia, cataracts, hyperlipidemia, hypertension, hypothyroidism, obesity, type 2 diabetes, CKD stage IIIb presented in the  ED with c/o: generalized  weakness and recurrent falls. Patient has generalized weakness for some time which has gotten worse.  She had a fall yesterday and was evaluated outpatient and found to have right humeral fracture and was placed in a sling.  Patient had additional fall yesterday, denies any loss of consciousness or head trauma or head injury. In the ED work-up revealed UA positive for UTI.  Lactic acid normal.  x-ray right shoulder shows humeral neck fracture. ED physician spoke with  orthopedics to discuss need for urgent evaluation.  Patient was admitted for generalized weakness secondary to UTI,  started on IV antibiotics.  Orthopedics consulted,  recommended conservative management. Patient is not a candidate for surgical intervention.  Advised outpatient follow-up.  Patient completed antibiotics for 3 days. PT recommended  skilled nursing facility for rehab.  Patient feels better and want to be discharged.  Patient being discharged skilled nursing facility for rehab.  PRECAUTIONS: Per secure chat with orthopedist Dr. Sabra Heck on 8/15//23, ok to begin gentle ROM and pt may put some weight into the RUE.  WEIGHT BEARING RESTRICTIONS No  PAIN:  Are you having pain? Yes: Pain location: R shoulder 4/10 pain at rest, 6/10 pain with activity  Pain description: achy Aggravating factors: lifting up arm  Relieving factors: rest, tylenol   FALLS: Has patient fallen in last 6 months? Yes. Number of falls "a bunch"  LIVING ENVIRONMENT: Lives with: lives with their spouse Lives in: first floor apartment Stairs: No Has following equipment at home: Lobbyist, Environmental consultant - 2 wheeled, Wheelchair (manual), Shower bench, and bed side commode  PLOF: Independent with basic ADLs, spouse and pt shared IADL responsibilities  PATIENT GOALS  "I'd like to be able to use it the way I used to and hold it up all the way."  OBJECTIVE:  HAND DOMINANCE: Right  FUNCTIONAL OUTCOME MEASURES: FOTO: 28  UPPER EXTREMITY ROM     Active ROM Right eval Left Eval WNL  Shoulder flexion 20 (35)   Shoulder abduction 25 (35)         Shoulder adduction    Shoulder extension    Shoulder internal rotation R thumb to R upper buttock ~55   Shoulder external rotation 0 (10)   Elbow flexion WNL   Elbow extension WNL   (Blank rows = not tested)    UPPER EXTREMITY MMT:     MMT Right Eval NT Left Eval 5/5  Shoulder flexion    Shoulder abduction    Shoulder adduction    Shoulder  extension    Shoulder internal rotation    Shoulder external rotation    Elbow flexion    Elbow extension    (Blank rows = not tested)  HAND FUNCTION: Grip strength: Right: 15 lbs; Left: 24 lbs, Lateral pinch: Right: 11 lbs, Left: 10 lbs, and 3 point pinch: Right: 7 lbs, Left: 6 lbs   TODAY'S TREATMENT:   Therapeutic Exercise: Pt seen this date for focus on right UE ROM, in sitting pt performing exercises to promote increased scapular movement, shoulder shrugs, scapular retraction and shoulder rolls prior to R arm motion.  In supine, pt seen for PROM to RUE for shoulder flexion, ABD, ADD, ER with prolonged stretch, elbow flexion/extension, forearm supination/pronation, wrist and hand ROM, followed by AAROM for the above motions in addition to attempts at place and hold with arm in 90 degrees of shoulder flexion, scapular stabilization exercises, triceps press with arm in 90 degrees of shoulder flexion,  focus on descent of arm from flexion with increased control.  Pt demonstrates overall decreased ROM and strength in UE with decreased functional use at home over time.  In sitting, pt seen for active reaching patterns with therapist guiding and cues.  At tabletop, pt seen for tabletop ROM for forward flexion, ABD, ADD with washcloth under hand, circular patterns to simulate IADL task of wiping down table.      PATIENT EDUCATION: Education details: pendulum swing technique, PROM, AAROM of shoulder Person educated: Patient Education method: Explanation, Tactile cues, and Verbal cues Education comprehension: verbalized understanding, returned demonstration, verbal cues required, tactile cues required, and needs further education   HOME EXERCISE PROGRAM: Pendulum swings; tabletop ROM for forward flexion, ABD, ADD, circular patterns for IADL task of wiping table  GOALS: Goals reviewed with patient? Yes  SHORT TERM GOALS: Target date: 07/13/22    Pt will be indep to perform HEP for increasing  ROM to the RUE.  Baseline:  Requires vc and tactile cues for pendulum swings; will add on additonal exercises with future visits  Goal status: INITIAL   LONG TERM GOALS: Target date: 08/24/22    Pt will increase FOTO score to 50 or better to indicate improved functional performance with daily tasks. Baseline: 28 Goal status: INITIAL  2.  Pt will increase active shoulder flexion and abd to 70 degrees or better to increase use of the RUE during UB ADLs. Baseline: active R shoulder flex 20, abd 25 Goal status: INITIAL  3.  Pt will increase active R shoulder ER to 30 degrees or better to enable brushing/combing R side of head with R dominant hand. Baseline: unable (active ER 0) Goal status: INITIAL  4.  Pt will increase R grip strength by 10 or more lbs to improve ability to hold and carry ADL supplies in R dominant hand without dropping. Baseline: R 15# (L 24#) Goal status: INITIAL   ASSESSMENT:  CLINICAL IMPRESSION: Pt continues to demonstrate decreased functional ROM in RUE at the shoulder but also has decreased ROM and strength throughout RUE secondary to decreased active and functional use in daily tasks.  Pt responds well to therapist guiding and cues during ROM exercises, performed in supine with gravity eliminated.  In sitting she continues to demonstrate difficulty with active reaching patterns.  Added tabletop exercises for AAROM for shoulder movement and simulated IADL task of cleaning/wiping tabletop.  Pt to continue with exercises at home and therapist will modify HEP and plan as patient progresses.     PERFORMANCE DEFICITS in functional skills including ADLs, IADLs, edema, ROM, strength, pain, flexibility, balance, body mechanics, decreased knowledge of precautions, decreased knowledge of use of DME, and UE functional use, cognitive skills including memory, and psychosocial skills including.   IMPAIRMENTS are limiting patient from ADLs, IADLs, and leisure.   COMORBIDITIES  may have co-morbidities  that affects occupational performance. Patient will benefit from skilled OT to address above impairments and improve overall function.  MODIFICATION OR ASSISTANCE TO COMPLETE EVALUATION: Min-Moderate modification of tasks or assist with assess necessary to complete an evaluation.  OT OCCUPATIONAL PROFILE AND HISTORY: Problem focused assessment: Including review of records relating to presenting problem.  CLINICAL DECISION MAKING: Moderate - several treatment options, min-mod task modification necessary  REHAB POTENTIAL: Good  EVALUATION COMPLEXITY: Moderate      PLAN: OT FREQUENCY: 2x/week  OT DURATION: 12 weeks  PLANNED INTERVENTIONS: self care/ADL training, therapeutic exercise, therapeutic activity, manual therapy, passive range of motion, balance training, moist heat,  cryotherapy, patient/family education, energy conservation, and DME and/or AE instructions  RECOMMENDED OTHER SERVICES: N/A  CONSULTED AND AGREED WITH PLAN OF CARE: Patient  PLAN FOR NEXT SESSION: gentle PROM/HEP progression   Arvetta Araque T Kari Montero, OTR/L, CLT Thomas Rhude, OT 07/09/2022, 10:40 AM

## 2022-07-11 NOTE — Therapy (Signed)
OUTPATIENT OCCUPATIONAL THERAPY ORTHO TREATMENT  Patient Name: Stacey Ball MRN: [id number] DOB:Apr 24, 1961, 61 y.o., female Today's Date: 07/06/2022  PCP: Dr. Netty Starring REFERRING PROVIDER: Mortimer Fries, PA  OT End of Session - 07/11/22 1650     Visit Number 4    Number of Visits 24    Date for OT Re-Evaluation 08/24/22    OT Start Time 1145    OT Stop Time 1230    OT Time Calculation (min) 45 min    Equipment Utilized During Treatment transport chair    Activity Tolerance Patient tolerated treatment well    Behavior During Therapy WFL for tasks assessed/performed             Past Medical History:  Diagnosis Date   Anemia    HX of   Arthritis    Bipolar 1 disorder ([place])    Cancer ([place])    skin cancer   Cataracts, bilateral    Diabetes mellitus without complication ([place])    Dyspnea    on exertion (Pt states, "out of shape")   Function kidney decreased    High cholesterol    Hypertension    Hypothyroidism    Psoriasis    Psoriatic arthritis ([place])    Thyroid disease    Past Surgical History:  Procedure Laterality Date   CATARACT EXTRACTION W/PHACO Left 01/10/2020   Procedure: CATARACT EXTRACTION PHACO AND INTRAOCULAR LENS PLACEMENT ([place]) LEFT DIABETIC;  Surgeon: Marchia Meiers, MD;  Location: [place];  Service: Ophthalmology;  Laterality: Left;  5.32 0:41.5   CATARACT EXTRACTION W/PHACO Right 02/07/2020   Procedure: CATARACT EXTRACTION PHACO AND INTRAOCULAR LENS PLACEMENT ([place]) RIGHT DIABETIC VISION BLUE 4.01 00:27.4;  Surgeon: Marchia Meiers, MD;  Location: [place];  Service: Ophthalmology;  Laterality: Right;  Diabetic - oral meds   COLONOSCOPY WITH PROPOFOL N/A 03/04/2016   Procedure: COLONOSCOPY WITH PROPOFOL;  Surgeon: Manya Silvas, MD;  Location: [facility] ENDOSCOPY;  Service: Endoscopy;  Laterality: N/A;   great toe amputation     matrixectomy     TONSILLECTOMY     Patient Active Problem List   Diagnosis Date Noted   Hypothyroidism  03/26/2022   UTI (urinary tract infection) 03/26/2022   Pneumonia due to COVID-19 virus 10/22/2020   Bipolar disorder ([place]) 10/22/2020   Acute encephalopathy 10/22/2020   Benign hypertensive kidney disease with chronic kidney disease 07/11/2019   Secondary hyperparathyroidism of renal origin ([place]) 07/11/2019   Class 1 obesity due to excess calories with serious comorbidity and body mass index (BMI) of 32.0 to 32.9 in adult 05/09/2019   Type 2 diabetes mellitus with stage 3 chronic kidney disease, without long-term current use of insulin ([place]) 09/16/2017   Stage 3 chronic kidney disease ([place]) 10/04/2016   Essential hypertriglyceridemia 03/22/2016   Essential hypertension 03/17/2016    ONSET DATE: 03/26/22  REFERRING DIAG: Shoulder pain, right; unspecified fx of upper end of right humerus, initial encounter for closed fx.  THERAPY DIAG:  Muscle weakness (generalized)  Right shoulder pain, unspecified chronicity  Stiffness of right shoulder, not elsewhere classified  Rationale for Evaluation and Treatment Rehabilitation  SUBJECTIVE:  SUBJECTIVE STATEMENT: Pt reports doing well today.  Pt accompanied by: self  PERTINENT HISTORY: Per chart on 03/26/22, 62 y/o old female with PMH significant for bipolar disorder, anemia, cataracts, hyperlipidemia, hypertension, hypothyroidism, obesity, type 2 diabetes, CKD stage IIIb presented in the ED with c/o: generalized  weakness and recurrent falls. Patient has generalized weakness for some time which has gotten worse.  She had a fall yesterday and was evaluated outpatient and found to have right humeral fracture and was placed in a sling.  Patient had additional fall yesterday, denies any loss of consciousness or head trauma or head injury. In the ED work-up revealed UA positive for UTI.  Lactic acid normal.  x-ray right shoulder shows humeral neck fracture. ED physician spoke with orthopedics to discuss need for urgent evaluation.  Patient was admitted  for generalized weakness secondary to UTI,  started on IV antibiotics.  Orthopedics consulted,  recommended conservative management. Patient is not a candidate for surgical intervention.  Advised outpatient follow-up.  Patient completed antibiotics for 3 days. PT recommended  skilled nursing facility for rehab.  Patient feels better and want to be discharged.  Patient being discharged skilled nursing facility for rehab.  PRECAUTIONS: Per secure chat with orthopedist Dr. Sabra Heck on 8/15//23, ok to begin gentle ROM and pt may put some weight into the RUE.  WEIGHT BEARING RESTRICTIONS No  PAIN:  Are you having pain? Yes: Pain location: R shoulder 4/10 pain at rest, 7/10 pain with activity  Pain description: achy Aggravating factors: lifting up arm  Relieving factors: rest, tylenol   FALLS: Has patient fallen in last 6 months? Yes. Number of falls "a bunch"  LIVING ENVIRONMENT: Lives with: lives with their spouse Lives in: first floor apartment Stairs: No Has following equipment at home: Lobbyist, Environmental consultant - 2 wheeled, Wheelchair (manual), Shower bench, and bed side commode  PLOF: Independent with basic ADLs, spouse and pt shared IADL responsibilities  PATIENT GOALS  "I'd like to be able to use it the way I used to and hold it up all the way."  OBJECTIVE:  HAND DOMINANCE: Right  FUNCTIONAL OUTCOME MEASURES: FOTO: 28  UPPER EXTREMITY ROM     Active ROM Right eval Left Eval WNL  Shoulder flexion 20 (35)   Shoulder abduction 25 (35)         Shoulder adduction    Shoulder extension    Shoulder internal rotation R thumb to R upper buttock ~55   Shoulder external rotation 0 (10)   Elbow flexion WNL   Elbow extension WNL   (Blank rows = not tested)    UPPER EXTREMITY MMT:     MMT Right Eval NT Left Eval 5/5  Shoulder flexion    Shoulder abduction    Shoulder adduction    Shoulder extension    Shoulder internal rotation    Shoulder external rotation    Elbow  flexion    Elbow extension    (Blank rows = not tested)  HAND FUNCTION: Grip strength: Right: 15 lbs; Left: 24 lbs, Lateral pinch: Right: 11 lbs, Left: 10 lbs, and 3 point pinch: Right: 7 lbs, Left: 6 lbs   TODAY'S TREATMENT:  Moist heat applied to R shoulder x 5  min for pain reduction/muscle relaxation in prep for therapeutic exercises.    Therapeutic Exercise: Pt seen this date for focus on RUE ROM, in sitting pt performing exercises to promote increased scapular movement, shoulder shrugs, scapular retraction and shoulder rolls prior to R arm motion.  In supine, pt seen for PROM to RUE for shoulder flexion, ABD, horiz ADD/abd, ER with prolonged stretch, elbow flexion/extension, forearm supination/pronation, wrist and hand ROM, followed by AAROM for the above motions. In sitting, reviewed table slides as follows; ROM for forward flexion, ABD, ADD with washcloth under hand, circular patterns to simulate IADL task of wiping down table, requiring mod  vc for technique, specifically to relax shoulder hike during above noted exercises.    PATIENT EDUCATION: Education details: table slides, PROM, AAROM of shoulder Person educated: Patient Education method: Explanation, Tactile cues, and Verbal cues Education comprehension: verbalized understanding, returned demonstration, verbal cues required, tactile cues required, and needs further education   HOME EXERCISE PROGRAM: Pendulum swings; tabletop ROM for forward flexion, ABD, ADD, circular patterns for IADL task of wiping table  GOALS: Goals reviewed with patient? Yes  SHORT TERM GOALS: Target date: 07/13/22    Pt will be indep to perform HEP for increasing ROM to the RUE.  Baseline:  Requires vc and tactile cues for pendulum swings; will add on additonal exercises with future visits  Goal status: INITIAL   LONG TERM GOALS: Target date: 08/24/22    Pt will increase FOTO score to 50 or better to indicate improved functional performance with  daily tasks. Baseline: 28 Goal status: INITIAL  2.  Pt will increase active shoulder flexion and abd to 70 degrees or better to increase use of the RUE during UB ADLs. Baseline: active R shoulder flex 20, abd 25 Goal status: INITIAL  3.  Pt will increase active R shoulder ER to 30 degrees or better to enable brushing/combing R side of head with R dominant hand. Baseline: unable (active ER 0) Goal status: INITIAL  4.  Pt will increase R grip strength by 10 or more lbs to improve ability to hold and carry ADL supplies in R dominant hand without dropping. Baseline: R 15# (L 24#) Goal status: INITIAL   ASSESSMENT:  CLINICAL IMPRESSION: Good tolerance to RUE PROM/AAROM this date.  Pt continues to demonstrate decreased functional ROM in RUE at the shoulder but also has decreased ROM and strength throughout RUE secondary to decreased active and functional use in daily tasks.  Pt responds well to therapist guiding and cues during ROM exercises, performed in supine with gravity eliminated.  In sitting she continues to demonstrate difficulty with active reaching patterns.  Reviewed tabletop exercises for AAROM for shoulder movement and simulated IADL task of cleaning/wiping tabletop, requiring mod vc to minimize shoulder hike with each motion.  Pt to continue with exercises at home and therapist will modify HEP and plan as patient progresses.    PERFORMANCE DEFICITS in functional skills including ADLs, IADLs, edema, ROM, strength, pain, flexibility, balance, body mechanics, decreased knowledge of precautions, decreased knowledge of use of DME, and UE functional use, cognitive skills including memory, and psychosocial skills including.   IMPAIRMENTS are limiting patient from ADLs, IADLs, and leisure.   COMORBIDITIES may have co-morbidities  that affects occupational performance. Patient will benefit from skilled OT to address above impairments and improve overall function.  MODIFICATION OR  ASSISTANCE TO COMPLETE EVALUATION: Min-Moderate modification of tasks or assist with assess necessary to complete an evaluation.  OT OCCUPATIONAL PROFILE AND HISTORY: Problem focused assessment: Including review of records relating to presenting problem.  CLINICAL DECISION MAKING: Moderate - several treatment options, min-mod task modification necessary  REHAB POTENTIAL: Good  EVALUATION COMPLEXITY: Moderate      PLAN: OT FREQUENCY: 2x/week  OT DURATION: 12 weeks  PLANNED INTERVENTIONS: self care/ADL training, therapeutic exercise, therapeutic activity, manual therapy, passive range of motion, balance training, moist heat, cryotherapy, patient/family education, energy conservation, and DME and/or AE instructions  RECOMMENDED OTHER SERVICES: N/A  CONSULTED AND AGREED WITH PLAN OF CARE: Patient  PLAN FOR NEXT SESSION: gentle PROM/HEP progression   Leta Speller, MS, OTR/L  Darleene Cleaver, OT  07/11/2022, 4:52 PM

## 2022-07-13 ENCOUNTER — Ambulatory Visit: Payer: PPO | Admitting: Occupational Therapy

## 2022-07-13 ENCOUNTER — Ambulatory Visit: Payer: PPO

## 2022-07-14 DIAGNOSIS — E781 Pure hyperglyceridemia: Secondary | ICD-10-CM | POA: Diagnosis not present

## 2022-07-14 DIAGNOSIS — N1831 Chronic kidney disease, stage 3a: Secondary | ICD-10-CM | POA: Diagnosis not present

## 2022-07-14 DIAGNOSIS — I10 Essential (primary) hypertension: Secondary | ICD-10-CM | POA: Diagnosis not present

## 2022-07-15 ENCOUNTER — Ambulatory Visit: Payer: PPO

## 2022-07-20 ENCOUNTER — Ambulatory Visit: Payer: PPO | Admitting: Occupational Therapy

## 2022-07-20 ENCOUNTER — Ambulatory Visit: Payer: PPO | Admitting: Physical Therapy

## 2022-07-21 DIAGNOSIS — E1122 Type 2 diabetes mellitus with diabetic chronic kidney disease: Secondary | ICD-10-CM | POA: Diagnosis not present

## 2022-07-21 DIAGNOSIS — Z0000 Encounter for general adult medical examination without abnormal findings: Secondary | ICD-10-CM | POA: Diagnosis not present

## 2022-07-21 DIAGNOSIS — N1831 Chronic kidney disease, stage 3a: Secondary | ICD-10-CM | POA: Diagnosis not present

## 2022-07-21 DIAGNOSIS — Z6833 Body mass index (BMI) 33.0-33.9, adult: Secondary | ICD-10-CM | POA: Diagnosis not present

## 2022-07-21 DIAGNOSIS — E871 Hypo-osmolality and hyponatremia: Secondary | ICD-10-CM | POA: Diagnosis not present

## 2022-07-21 DIAGNOSIS — I10 Essential (primary) hypertension: Secondary | ICD-10-CM | POA: Diagnosis not present

## 2022-07-21 DIAGNOSIS — E6609 Other obesity due to excess calories: Secondary | ICD-10-CM | POA: Diagnosis not present

## 2022-07-22 ENCOUNTER — Ambulatory Visit: Payer: PPO

## 2022-07-22 ENCOUNTER — Ambulatory Visit: Payer: PPO | Admitting: Physical Therapy

## 2022-07-27 ENCOUNTER — Encounter: Payer: PPO | Admitting: Occupational Therapy

## 2022-07-29 ENCOUNTER — Encounter: Payer: PPO | Admitting: Physical Therapy

## 2022-08-03 ENCOUNTER — Encounter: Payer: PPO | Admitting: Occupational Therapy

## 2022-08-05 ENCOUNTER — Encounter: Payer: PPO | Admitting: Physical Therapy

## 2022-08-10 ENCOUNTER — Encounter: Payer: PPO | Admitting: Physical Therapy

## 2022-08-12 ENCOUNTER — Encounter: Payer: PPO | Admitting: Physical Therapy

## 2022-08-17 ENCOUNTER — Encounter: Payer: PPO | Admitting: Occupational Therapy

## 2022-08-19 ENCOUNTER — Encounter: Payer: PPO | Admitting: Occupational Therapy

## 2022-08-24 ENCOUNTER — Encounter: Payer: PPO | Admitting: Physical Therapy

## 2022-08-24 ENCOUNTER — Encounter: Payer: PPO | Admitting: Occupational Therapy

## 2022-08-26 ENCOUNTER — Encounter: Payer: PPO | Admitting: Physical Therapy

## 2022-08-26 ENCOUNTER — Encounter: Payer: PPO | Admitting: Occupational Therapy

## 2022-09-02 ENCOUNTER — Encounter: Payer: PPO | Admitting: Occupational Therapy

## 2022-09-02 ENCOUNTER — Encounter: Payer: PPO | Admitting: Physical Therapy

## 2022-09-07 ENCOUNTER — Encounter: Payer: PPO | Admitting: Occupational Therapy

## 2022-09-14 ENCOUNTER — Encounter: Payer: PPO | Admitting: Occupational Therapy

## 2022-09-23 ENCOUNTER — Encounter: Payer: PPO | Admitting: Occupational Therapy

## 2022-09-23 ENCOUNTER — Ambulatory Visit: Payer: PPO | Admitting: Physical Therapy

## 2022-09-28 ENCOUNTER — Encounter: Payer: PPO | Admitting: Physical Therapy

## 2022-09-30 ENCOUNTER — Encounter: Payer: PPO | Admitting: Occupational Therapy

## 2022-10-07 ENCOUNTER — Encounter: Payer: PPO | Admitting: Physical Therapy

## 2022-11-02 DIAGNOSIS — F250 Schizoaffective disorder, bipolar type: Secondary | ICD-10-CM | POA: Diagnosis not present

## 2022-12-27 DIAGNOSIS — E039 Hypothyroidism, unspecified: Secondary | ICD-10-CM | POA: Diagnosis not present

## 2022-12-27 DIAGNOSIS — E1122 Type 2 diabetes mellitus with diabetic chronic kidney disease: Secondary | ICD-10-CM | POA: Diagnosis not present

## 2022-12-27 DIAGNOSIS — N183 Chronic kidney disease, stage 3 unspecified: Secondary | ICD-10-CM | POA: Diagnosis not present

## 2023-01-03 DIAGNOSIS — E1122 Type 2 diabetes mellitus with diabetic chronic kidney disease: Secondary | ICD-10-CM | POA: Diagnosis not present

## 2023-01-03 DIAGNOSIS — E039 Hypothyroidism, unspecified: Secondary | ICD-10-CM | POA: Diagnosis not present

## 2023-01-03 DIAGNOSIS — N183 Chronic kidney disease, stage 3 unspecified: Secondary | ICD-10-CM | POA: Diagnosis not present

## 2023-01-03 DIAGNOSIS — Z8781 Personal history of (healed) traumatic fracture: Secondary | ICD-10-CM | POA: Diagnosis not present

## 2023-01-03 DIAGNOSIS — Z780 Asymptomatic menopausal state: Secondary | ICD-10-CM | POA: Diagnosis not present

## 2023-01-27 DIAGNOSIS — E1122 Type 2 diabetes mellitus with diabetic chronic kidney disease: Secondary | ICD-10-CM | POA: Diagnosis not present

## 2023-01-27 DIAGNOSIS — N1831 Chronic kidney disease, stage 3a: Secondary | ICD-10-CM | POA: Diagnosis not present

## 2023-02-03 DIAGNOSIS — Z862 Personal history of diseases of the blood and blood-forming organs and certain disorders involving the immune mechanism: Secondary | ICD-10-CM | POA: Diagnosis not present

## 2023-02-03 DIAGNOSIS — E871 Hypo-osmolality and hyponatremia: Secondary | ICD-10-CM | POA: Diagnosis not present

## 2023-02-03 DIAGNOSIS — Z6837 Body mass index (BMI) 37.0-37.9, adult: Secondary | ICD-10-CM | POA: Diagnosis not present

## 2023-02-03 DIAGNOSIS — E1142 Type 2 diabetes mellitus with diabetic polyneuropathy: Secondary | ICD-10-CM | POA: Diagnosis not present

## 2023-02-03 DIAGNOSIS — F3132 Bipolar disorder, current episode depressed, moderate: Secondary | ICD-10-CM | POA: Diagnosis not present

## 2023-04-11 DIAGNOSIS — N183 Chronic kidney disease, stage 3 unspecified: Secondary | ICD-10-CM | POA: Diagnosis not present

## 2023-04-11 DIAGNOSIS — E039 Hypothyroidism, unspecified: Secondary | ICD-10-CM | POA: Diagnosis not present

## 2023-04-11 DIAGNOSIS — E1122 Type 2 diabetes mellitus with diabetic chronic kidney disease: Secondary | ICD-10-CM | POA: Diagnosis not present

## 2023-04-18 DIAGNOSIS — N183 Chronic kidney disease, stage 3 unspecified: Secondary | ICD-10-CM | POA: Diagnosis not present

## 2023-04-18 DIAGNOSIS — E1122 Type 2 diabetes mellitus with diabetic chronic kidney disease: Secondary | ICD-10-CM | POA: Diagnosis not present

## 2023-04-18 DIAGNOSIS — E039 Hypothyroidism, unspecified: Secondary | ICD-10-CM | POA: Diagnosis not present

## 2023-04-20 DIAGNOSIS — F250 Schizoaffective disorder, bipolar type: Secondary | ICD-10-CM | POA: Diagnosis not present

## 2023-06-14 IMAGING — MR MR HEAD W/O CM
11 series · 48 of 48 positions shown · non-contrast
Comparison: Head CT 10/21/2020.

CLINICAL DATA: Dizziness. Additional history provided: Patient
reports fall (with head trauma) this year. Patient reports head
"bobs "while watching TV, hand tremors.

EXAM:
MRI HEAD WITHOUT CONTRAST
TECHNIQUE: Multiplanar, multiecho pulse sequences of the brain and surrounding
structures were obtained without intravenous contrast.

[Series 5: ax dwi_tracew · axial · 3.0mm · 1.80mm/px · z∈[-75,+80]mm · 5 of 48 slices shown]
[im 1/48]
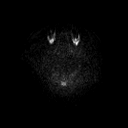
[im 12/48]
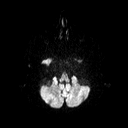
[im 24/48]
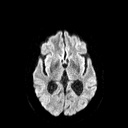
[im 36/48]
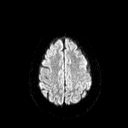
[im 48/48]
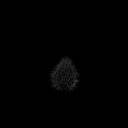

[Series 6: ax dwi_adc · axial · 3.0mm · 1.80mm/px · z∈[-75,+80]mm · 4 of 48 slices shown]
[im 1/48]
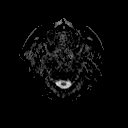
[im 16/48]
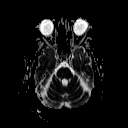
[im 32/48]
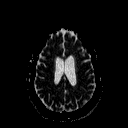
[im 48/48]
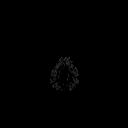

[Series 7: cor dwi_tracew · coronal · 5.0mm · 1.80mm/px · 3 of 38 slices shown]
[im 1/38]
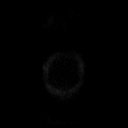
[im 19/38]
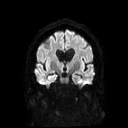
[im 38/38]
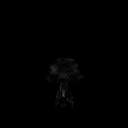

[Series 8: cor dwi_adc · coronal · 5.0mm · 1.80mm/px · 3 of 38 slices shown]
[im 1/38]
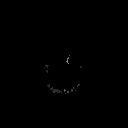
[im 19/38]
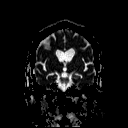
[im 38/38]
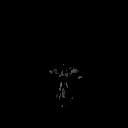

[Series 9: T1 · sagittal · 5.0mm · 0.62mm/px · 2 of 21 slices shown (1 of 2)]
[im 1/21]
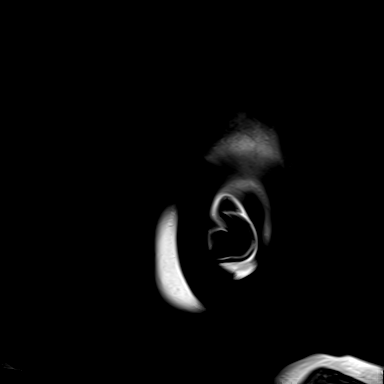
[im 21/21]
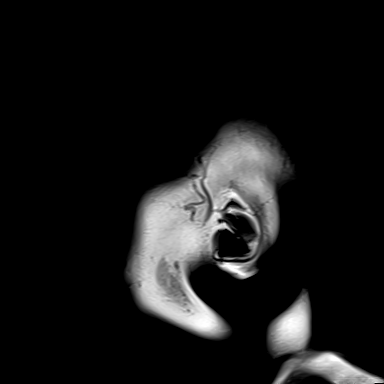

[Series 10: T2 · axial · 5.0mm · 0.53mm/px · z∈[-68,+75]mm · 2 of 25 slices shown (1 of 2)]
[im 1/25]
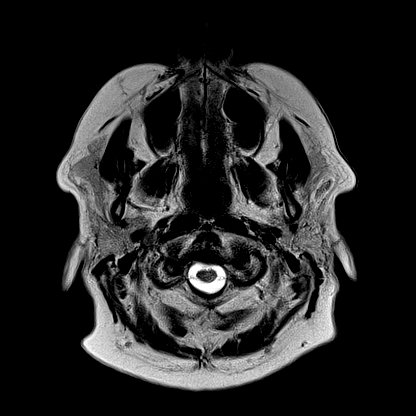
[im 25/25]
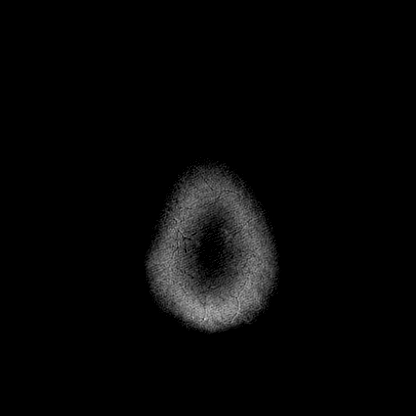

[Series 12: pha_images · axial · 3.0mm · 0.90mm/px · z∈[-73,+80]mm · 4 of 51 slices shown]
[im 1/51]
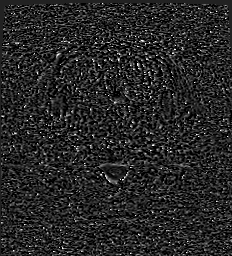
[im 17/51]
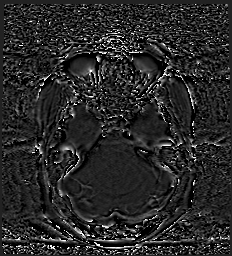
[im 34/51]
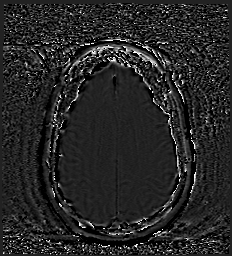
[im 51/51]
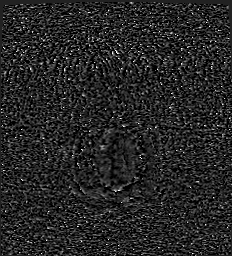

[Series 13: swi_images · axial · 3.0mm · 0.90mm/px · z∈[-73,+80]mm · 4 of 52 slices shown]
[im 1/52]
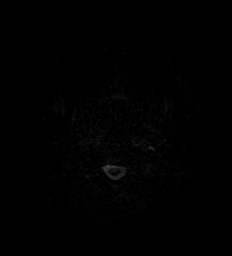
[im 18/52]
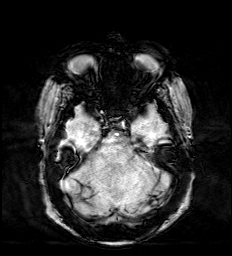
[im 35/52]
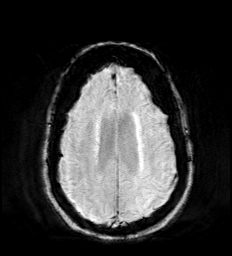
[im 52/52]
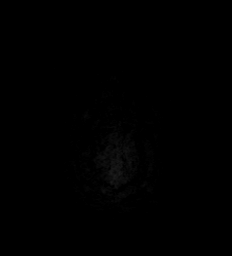

[Series 15: FLAIR · axial · 3.0mm · 0.69mm/px · z∈[-77,+84]mm · 5 of 55 slices shown]
[im 1/55]
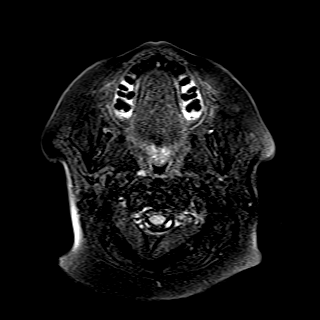
[im 14/55]
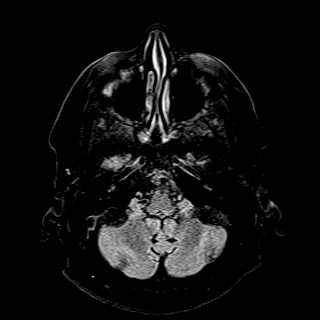
[im 28/55]
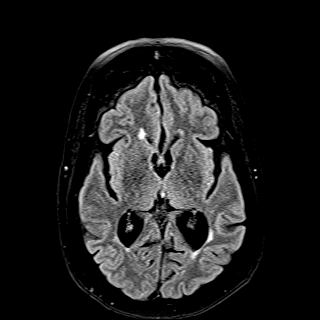
[im 41/55]
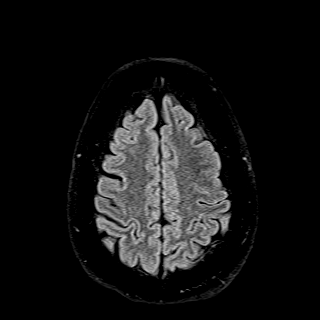
[im 55/55]
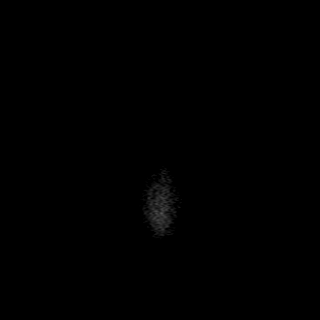

[Series 16: T1 · axial · 1.0mm · 0.98mm/px · z∈[-81,+93]mm · 14 of 175 slices shown (2 of 2)]
[im 1/175]
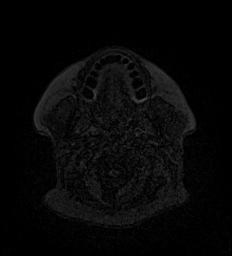
[im 14/175]
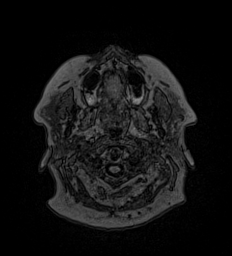
[im 27/175]
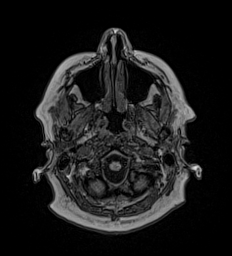
[im 41/175]
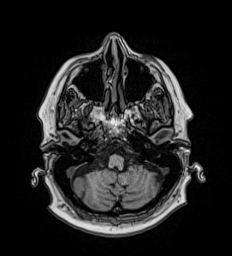
[im 54/175]
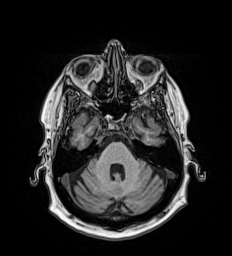
[im 67/175]
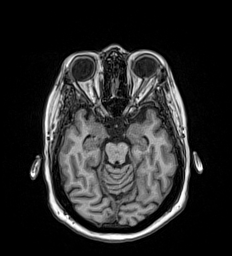
[im 81/175]
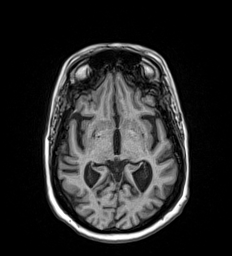
[im 94/175]
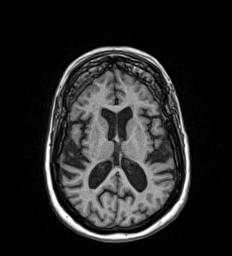
[im 108/175]
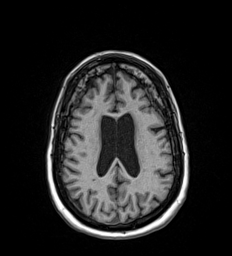
[im 121/175]
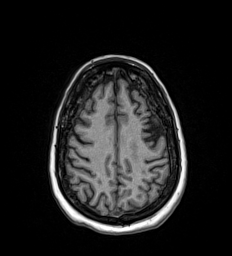
[im 134/175]
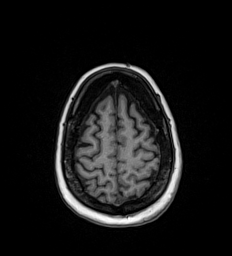
[im 148/175]
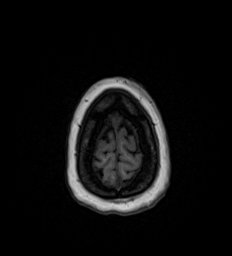
[im 161/175]
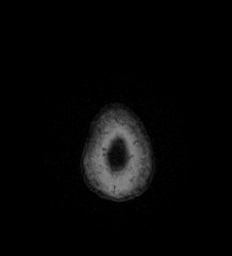
[im 175/175]
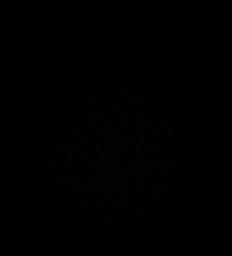

[Series 17: T2 · coronal · 5.0mm · 0.69mm/px · 2 of 29 slices shown (2 of 2)]
[im 1/29]
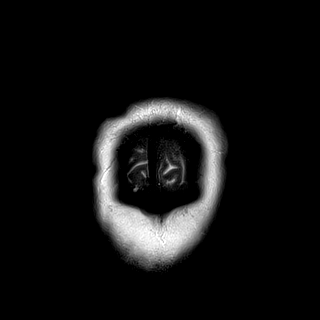
[im 29/29]
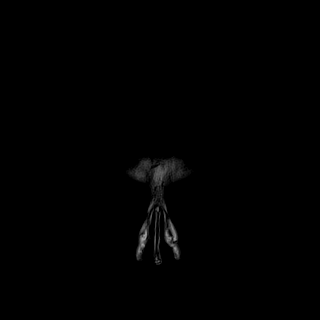

[48 of 48 positions shown; findings below may reference images not displayed]

FINDINGS: Brain:

Mild generalized cerebral and cerebellar atrophy.

Mild multifocal T2 FLAIR hyperintense signal abnormality within the
cerebral white matter, nonspecific but compatible with chronic small
vessel ischemic disease.

No cortical encephalomalacia is identified.

There is no acute infarct.

No evidence of an intracranial mass.

No chronic intracranial blood products.

No extra-axial fluid collection.

No midline shift.

Vascular: Maintained flow voids within the proximal large arterial
vessels.

Skull and upper cervical spine: No focal suspicious marrow lesion.
Incompletely assessed cervical spondylosis.

Sinuses/Orbits: Visualized orbits show no acute finding. No
significant paranasal sinus disease.

Other: Trace fluid within the right mastoid air cells.
IMPRESSION: No evidence of acute intracranial abnormality.

Mild chronic small vessel ischemic changes within the cerebral white
matter.

Mild generalized parenchymal atrophy.

Trace fluid within the right mastoid air cells.

## 2023-06-30 DIAGNOSIS — S42201A Unspecified fracture of upper end of right humerus, initial encounter for closed fracture: Secondary | ICD-10-CM | POA: Diagnosis not present

## 2023-07-21 DIAGNOSIS — M25511 Pain in right shoulder: Secondary | ICD-10-CM | POA: Diagnosis not present

## 2023-07-26 DIAGNOSIS — M25511 Pain in right shoulder: Secondary | ICD-10-CM | POA: Diagnosis not present

## 2023-07-29 DIAGNOSIS — M25511 Pain in right shoulder: Secondary | ICD-10-CM | POA: Diagnosis not present

## 2023-08-02 DIAGNOSIS — M25511 Pain in right shoulder: Secondary | ICD-10-CM | POA: Diagnosis not present

## 2023-08-04 DIAGNOSIS — M25511 Pain in right shoulder: Secondary | ICD-10-CM | POA: Diagnosis not present

## 2023-08-11 DIAGNOSIS — M25511 Pain in right shoulder: Secondary | ICD-10-CM | POA: Diagnosis not present

## 2023-09-08 DIAGNOSIS — E871 Hypo-osmolality and hyponatremia: Secondary | ICD-10-CM | POA: Diagnosis not present

## 2023-09-08 DIAGNOSIS — Z862 Personal history of diseases of the blood and blood-forming organs and certain disorders involving the immune mechanism: Secondary | ICD-10-CM | POA: Diagnosis not present

## 2023-09-08 DIAGNOSIS — E1142 Type 2 diabetes mellitus with diabetic polyneuropathy: Secondary | ICD-10-CM | POA: Diagnosis not present

## 2023-09-14 DIAGNOSIS — E66812 Obesity, class 2: Secondary | ICD-10-CM | POA: Diagnosis not present

## 2023-09-14 DIAGNOSIS — Z6838 Body mass index (BMI) 38.0-38.9, adult: Secondary | ICD-10-CM | POA: Diagnosis not present

## 2023-09-14 DIAGNOSIS — Z0000 Encounter for general adult medical examination without abnormal findings: Secondary | ICD-10-CM | POA: Diagnosis not present

## 2023-09-14 DIAGNOSIS — E7800 Pure hypercholesterolemia, unspecified: Secondary | ICD-10-CM | POA: Diagnosis not present

## 2023-09-14 DIAGNOSIS — E1142 Type 2 diabetes mellitus with diabetic polyneuropathy: Secondary | ICD-10-CM | POA: Diagnosis not present

## 2023-10-20 DIAGNOSIS — E1122 Type 2 diabetes mellitus with diabetic chronic kidney disease: Secondary | ICD-10-CM | POA: Diagnosis not present

## 2023-10-20 DIAGNOSIS — N183 Chronic kidney disease, stage 3 unspecified: Secondary | ICD-10-CM | POA: Diagnosis not present

## 2023-10-20 DIAGNOSIS — F250 Schizoaffective disorder, bipolar type: Secondary | ICD-10-CM | POA: Diagnosis not present

## 2023-11-04 DIAGNOSIS — N183 Chronic kidney disease, stage 3 unspecified: Secondary | ICD-10-CM | POA: Diagnosis not present

## 2023-11-04 DIAGNOSIS — R300 Dysuria: Secondary | ICD-10-CM | POA: Diagnosis not present

## 2023-11-04 DIAGNOSIS — E039 Hypothyroidism, unspecified: Secondary | ICD-10-CM | POA: Diagnosis not present

## 2023-11-04 DIAGNOSIS — E1122 Type 2 diabetes mellitus with diabetic chronic kidney disease: Secondary | ICD-10-CM | POA: Diagnosis not present

## 2024-01-19 DIAGNOSIS — R350 Frequency of micturition: Secondary | ICD-10-CM | POA: Diagnosis not present

## 2024-01-19 DIAGNOSIS — R8290 Unspecified abnormal findings in urine: Secondary | ICD-10-CM | POA: Diagnosis not present

## 2024-01-19 DIAGNOSIS — R34 Anuria and oliguria: Secondary | ICD-10-CM | POA: Diagnosis not present

## 2024-03-30 DIAGNOSIS — R42 Dizziness and giddiness: Secondary | ICD-10-CM | POA: Diagnosis not present

## 2024-03-30 DIAGNOSIS — R319 Hematuria, unspecified: Secondary | ICD-10-CM | POA: Diagnosis not present

## 2024-04-03 DIAGNOSIS — M25511 Pain in right shoulder: Secondary | ICD-10-CM | POA: Diagnosis not present

## 2024-04-03 DIAGNOSIS — G8929 Other chronic pain: Secondary | ICD-10-CM | POA: Diagnosis not present

## 2024-04-03 DIAGNOSIS — R29898 Other symptoms and signs involving the musculoskeletal system: Secondary | ICD-10-CM | POA: Diagnosis not present

## 2024-04-25 DIAGNOSIS — F250 Schizoaffective disorder, bipolar type: Secondary | ICD-10-CM | POA: Diagnosis not present

## 2024-04-26 ENCOUNTER — Encounter: Payer: Self-pay | Admitting: Physical Therapy

## 2024-04-26 ENCOUNTER — Ambulatory Visit: Admitting: Physical Therapy

## 2024-04-26 DIAGNOSIS — R278 Other lack of coordination: Secondary | ICD-10-CM | POA: Insufficient documentation

## 2024-04-26 DIAGNOSIS — R2681 Unsteadiness on feet: Secondary | ICD-10-CM | POA: Diagnosis not present

## 2024-04-26 DIAGNOSIS — R2689 Other abnormalities of gait and mobility: Secondary | ICD-10-CM | POA: Insufficient documentation

## 2024-04-26 DIAGNOSIS — M6281 Muscle weakness (generalized): Secondary | ICD-10-CM | POA: Diagnosis not present

## 2024-04-26 DIAGNOSIS — M25611 Stiffness of right shoulder, not elsewhere classified: Secondary | ICD-10-CM | POA: Insufficient documentation

## 2024-04-26 DIAGNOSIS — G8929 Other chronic pain: Secondary | ICD-10-CM | POA: Diagnosis not present

## 2024-04-26 DIAGNOSIS — R262 Difficulty in walking, not elsewhere classified: Secondary | ICD-10-CM | POA: Insufficient documentation

## 2024-04-26 DIAGNOSIS — M25511 Pain in right shoulder: Secondary | ICD-10-CM | POA: Diagnosis not present

## 2024-04-26 NOTE — Therapy (Signed)
 OUTPATIENT PHYSICAL THERAPY NEURO EVALUATION   Patient Name: Stacey Ball MRN: [id number] DOB:25-Dec-1960, 63 y.o., female Today's Date: 04/26/2024   PCP: Alla Amis MD REFERRING PROVIDER: Jonette, Minor  END OF SESSION:    PT End of Session - 04/26/24 1620     Visit Number 1    Number of Visits 24    Date for PT Re-Evaluation 07/19/24    PT Start Time 0335    PT Stop Time 0415    PT Time Calculation (min) 40 min    Equipment Utilized During Treatment Gait belt    Activity Tolerance Patient tolerated treatment well    Behavior During Therapy WFL for tasks assessed/performed          Past Medical History:  Diagnosis Date   Anemia    HX of   Arthritis    Bipolar 1 disorder (HCC)    Cancer (HCC)    skin cancer   Cataracts, bilateral    Diabetes mellitus without complication (HCC)    Dyspnea    on exertion (Pt states, out of shape)   Function kidney decreased    High cholesterol    Hypertension    Hypothyroidism    Psoriasis    Psoriatic arthritis (HCC)    Thyroid  disease    Past Surgical History:  Procedure Laterality Date   CATARACT EXTRACTION W/PHACO Left 01/10/2020   Procedure: CATARACT EXTRACTION PHACO AND INTRAOCULAR LENS PLACEMENT (IOC) LEFT DIABETIC;  Surgeon: Ferol Rogue, MD;  Location: [facility];  Service: Ophthalmology;  Laterality: Left;  5.32 0:41.5   CATARACT EXTRACTION W/PHACO Right 02/07/2020   Procedure: CATARACT EXTRACTION PHACO AND INTRAOCULAR LENS PLACEMENT (IOC) RIGHT DIABETIC VISION BLUE 4.01 00:27.4;  Surgeon: Ferol Rogue, MD;  Location: [facility];  Service: Ophthalmology;  Laterality: Right;  Diabetic - oral meds   COLONOSCOPY WITH PROPOFOL  N/A 03/04/2016   Procedure: COLONOSCOPY WITH PROPOFOL ;  Surgeon: Lamar ONEIDA Holmes, MD;  Location: [facility];  Service: Endoscopy;  Laterality: N/A;   great toe amputation     matrixectomy     TONSILLECTOMY     Patient Active Problem List   Diagnosis Date Noted    Hypothyroidism 03/26/2022   UTI (urinary tract infection) 03/26/2022   Pneumonia due to COVID-19 virus 10/22/2020   Bipolar disorder (HCC) 10/22/2020   Acute encephalopathy 10/22/2020   Benign hypertensive kidney disease with chronic kidney disease 07/11/2019   Secondary hyperparathyroidism of renal origin (HCC) 07/11/2019   Class 1 obesity due to excess calories with serious comorbidity and body mass index (BMI) of 32.0 to 32.9 in adult 05/09/2019   Type 2 diabetes mellitus with stage 3 chronic kidney disease, without long-term current use of insulin  (HCC) 09/16/2017   Stage 3 chronic kidney disease (HCC) 10/04/2016   Essential hypertriglyceridemia 03/22/2016   Essential hypertension 03/17/2016    ONSET DATE: 2 years ago  REFERRING DIAG: Chronic right shoulder pain [M25.511, G89.29], Leg weakness, bilateral [R29.898]   THERAPY DIAG:  Muscle weakness (generalized)  Unsteadiness on feet  Difficulty in walking, not elsewhere classified  Right shoulder pain, unspecified chronicity  Other lack of coordination  Stiffness of right shoulder, not elsewhere classified  Rationale for Evaluation and Treatment: Rehabilitation  SUBJECTIVE:  SUBJECTIVE STATEMENT: Pt presents to clinic today in transport chair. Pt has had consistent falls for the last 2 years, but she reports not having fallen in the last 6 months. Pt uses a 4WW at home c the brakes constantly on to avoid the rollator rolling outside her BOS. Pt stated she does not like 2WW as she fell over one when attempting to rush to the toilet one night. Pt states she has dizzy spells, particularly when coming from a sit to stand or with head turns during gait. Pt also stated her dizziness has tapered off recently as well. She reports all these started  around when she was hospitalized for covid and PNA 2 years ago. Pt has a bed rail in her bed. Installed it after falling out of bed multiple times. Pt has very limited shoulder ROM. She reports her husband helps around a lot at home with ADLs, BADLS, and IADLs like washing her hair. However, she feels his help is prohibiting her from getting better and requested us  to explain to him that she needs to attempt more tasks on her own.     Pt accompanied by: self  PERTINENT HISTORY: Per last PA note: Past Medical History:  Diagnosis Date  Aortic atherosclerosis ()  on CT scan 10/2020  Basal cell carcinoma 2017  left side of nose s/p Moh's surgery  Bipolar disorder (CMS/HHS-HCC)  Cataracts, bilateral  Chronic kidney disease (CKD), stage III (moderate) (CMS/HHS-HCC)  Dyslipidemia  Hypothyroidism  Osteoarthritis  Psoriasis  Psoriatic arthritis (CMS/HHS-HCC)  Psoriatic arthritis (CMS/HHS-HCC)  Type 2 diabetes mellitus (CMS/HHS-HCC)  Diabetic peripheral neuropathy with hx of diabetic foot ulcer.    PAIN:  Are you having pain? No  PRECAUTIONS: Fall  RED FLAGS: None   WEIGHT BEARING RESTRICTIONS: No  FALLS: Has patient fallen in last 6 months? No  LIVING ENVIRONMENT: Lives with: lives with their spouse Lives in: House/apartment Stairs: No Has following equipment at home: Single point cane, Environmental consultant - 2 wheeled, and Environmental consultant - 4 wheeled  PLOF: Independent  PATIENT GOALS: Would like to walk without AD, Work up strength in L and R arm.   OBJECTIVE:  Note: Objective measures were completed at Evaluation unless otherwise noted.  DIAGNOSTIC FINDINGS: Per Imaging note from 03/26/2022:    Narrative & Impression  CLINICAL DATA:  Humerus fracture yesterday. Clemens again. Known proximal humerus fracture.   EXAM: RIGHT SHOULDER - 2+ VIEW   COMPARISON:  Reportedly right shoulder radiographs were performed 03/25/2022 (yesterday) however are unavailable.   FINDINGS: There is a markedly  comminuted and moderately displaced fracture of the right humeral surgical neck. On frontal view there appears to be approximately 1.5 cm fracture line craniocaudal diastasis. There is a cortical fragment measuring up to 2 cm that appears to be displaced medially 8 mm.   There is minimal varus angulation of the fracture. On transscapular Y-view there appears to be anterior angulation of the fracture and anterior displacement of the distal fracture component with respect to the proximal fracture component. There appears to be volume loss from a cortical fracture of the superolateral humeral head greater tuberosity.   The humeral head remains appropriately located with respect of the glenoid.   Mild acromioclavicular joint space narrowing and peripheral osteophytosis.   IMPRESSION: 1. Markedly comminuted and moderately displaced fracture of the right humeral surgical neck. 2. Likely additional comminuted greater tuberosity of the humeral head fracture.    COGNITION: Overall cognitive status: Within functional limits for tasks assessed   POSTURE:  rounded shoulders, forward head, increased lumbar lordosis, posterior pelvic tilt, and flexed trunk    LOWER EXTREMITY MMT:    MMT Right Eval Left Eval  Hip flexion 4 4  Hip extension    Hip abduction 4+ 4+  Hip adduction    Hip internal rotation    Hip external rotation    Knee flexion 5 4  Knee extension 4 4-  Ankle dorsiflexion 4 4  Ankle plantarflexion    Ankle inversion    Ankle eversion    (Blank rows = not tested)    GAIT: Findings: Gait Characteristics: decreased step length- Right, decreased step length- Left, decreased stride length, decreased hip/knee flexion- Right, decreased hip/knee flexion- Left, shuffling, festinating, poor foot clearance- Right, and poor foot clearance- Left, Distance walked: 10 Ft, Assistive device utilized:Walker - 4 wheeled, Level of assistance: CGA, and Comments:    FUNCTIONAL TESTS:   5 times sit to stand: 33.09 seconds B UE support Timed up and go (TUG): 42.64 sec c Rollator 2 minute walk test: Not yet Tested 10 meter walk test: Not yet tested     PATIENT SURVEYS:  ABC scale: The Activities-Specific Balance Confidence (ABC) Scale 0% 10 20 30  40 50 60 70 80 90 100% No confidence<->completely confident  "How confident are you that you will not lose your balance or become unsteady when you . . .   Date tested 04/26/2024  Walk around the house 80%  2. Walk up or down stairs 0%  3. Bend over and pick up a slipper from in front of a closet floor 30%  4. Reach for a small can off a shelf at eye level 80%  5. Stand on tip toes and reach for something above your head 10%  6. Stand on a chair and reach for something 0%  7. Sweep the floor 70%  8. Walk outside the house to a car parked in the driveway 30%  9. Get into or out of a car 70%  10. Walk across a parking lot to the mall 70%  11. Walk up or down a ramp 30%  12. Walk in a crowded mall where people rapidly walk past you 10%  13. Are bumped into by people as you walk through the mall 0%  14. Step onto or off of an escalator while you are holding onto the railing 0%  15. Step onto or off an escalator while holding onto parcels such that you cannot hold onto the railing 0%  16. Walk outside on icy sidewalks 0%  Total: #/16 30%                                                                                                                                 TREATMENT DATE:  04/27/24   SELF CARE Patient instructed in plan of care, findings for evaluation, and ways of physical therapy may improve their function and quality of life.  PATIENT EDUCATION: Education details: POC Person educated: Patient Education method: Explanation Education comprehension: verbalized understanding   HOME EXERCISE PROGRAM: Establish visit 2     GOALS: Goals reviewed with patient? Yes  SHORT TERM GOALS: Target date:  07/19/2024   Consistently perform HEP at least 4x/week to maintain progress made in PT to improve function and overall QoL. Baseline: Not yet Given Goal status: INITIAL   LONG TERM GOALS: Target date: 07/19/2024    Pt will decrease her STS time by 10 seconds to improve power and strength which will increase QoL.  Baseline: 33.69 sec B UE support Goal status: INITIAL  2.  Pt will decrease her TUG time by 10 seconds to reduce risk of falls and increase QoL.  Baseline: 42.64 sec with rollator Goal status: INITIAL   3.  Pt will decrease time by 0.25 m/s in order to increase gait speed to reduce fall risk. Baseline: Not yet tested Goal status: INITIAL  4.  Pt will increase distance walked during to aid with community ambulation and reduce fall risk. Baseline: Not yet Tested Goal status: INITIAL  5.  Pt will increase ABC score to 50% to increase her confidence with daily activities and improve overall QoL. Baseline: Not yet Tested  Goal status: INITIAL  ASSESSMENT:  CLINICAL IMPRESSION:  Patient is a 63 y.o. F who was seen today for physical therapy evaluation and treatment for Balance issues, LE weakness, and Shoulder immobility. Discussed giving pt an OT referral for her shoulder which she was open to. Pt performance during STS and TUG were well below norms for people in her age group. Pt displayed significant shuffling during TUG test. Her step cadence was very rapid and did not allow for proper step clearance. Possible that she shuffles so fast in attempts to keep up with her rollator. Pt is not ambulating well with the rollator and would benefit from 2WW and education regarding its use. Pt's fwd trunk lean further exacerbates this issue as it pushes the rollator further outside her BOS, causing her to shuffle even faster to keep up with the rollator. Pt would likely not be able to ambulate long distances without falling using the rollator d/t this. Pt will continue to  benefit from skilled physical therapy intervention to address impairments, improve QOL, and attain therapy goals.    OBJECTIVE IMPAIRMENTS: Abnormal gait, decreased activity tolerance, decreased balance, decreased coordination, decreased knowledge of use of DME, decreased mobility, difficulty walking, decreased ROM, decreased strength, dizziness, and improper body mechanics.   ACTIVITY LIMITATIONS: carrying, lifting, bending, standing, squatting, transfers, bed mobility, bathing, and locomotion level  PARTICIPATION LIMITATIONS: meal prep, cleaning, laundry, medication management, driving, shopping, community activity, and yard work  PERSONAL FACTORS: Past/current experiences, Time since onset of injury/illness/exacerbation, and 1-2 comorbidities:   are also affecting patient's functional outcome.   REHAB POTENTIAL: Good  CLINICAL DECISION MAKING: Evolving/moderate complexity  EVALUATION COMPLEXITY: Moderate  PLAN:  PT FREQUENCY: 2x/week  PT DURATION: 12 weeks  PLANNED INTERVENTIONS: 97110-Therapeutic exercises, 97530- Therapeutic activity, [id number]- Neuromuscular re-education, 97535- Self Care, [id number]- Manual therapy, [phone]- Gait training, Patient/Family education, Joint mobilization, and DME instructions  PLAN FOR NEXT SESSION: , Give HEP, LE strength training, Gait training in //bars   Casilda Human, Student-PT 04/26/2024, 5:02 PM

## 2024-04-30 ENCOUNTER — Ambulatory Visit: Admitting: Physical Therapy

## 2024-04-30 DIAGNOSIS — M6281 Muscle weakness (generalized): Secondary | ICD-10-CM

## 2024-04-30 DIAGNOSIS — R278 Other lack of coordination: Secondary | ICD-10-CM

## 2024-04-30 DIAGNOSIS — M25511 Pain in right shoulder: Secondary | ICD-10-CM

## 2024-04-30 DIAGNOSIS — R262 Difficulty in walking, not elsewhere classified: Secondary | ICD-10-CM

## 2024-04-30 DIAGNOSIS — R2681 Unsteadiness on feet: Secondary | ICD-10-CM

## 2024-04-30 DIAGNOSIS — R2689 Other abnormalities of gait and mobility: Secondary | ICD-10-CM

## 2024-04-30 DIAGNOSIS — M25611 Stiffness of right shoulder, not elsewhere classified: Secondary | ICD-10-CM

## 2024-04-30 NOTE — Therapy (Signed)
 OUTPATIENT PHYSICAL THERAPY NEURO TREATMENT   Patient Name: Stacey Ball MRN: [id number] DOB:03-Jun-1961, 63 y.o., female Today's Date: 04/30/2024   PCP: Alla Amis MD REFERRING PROVIDER: Jonette, Minor  END OF SESSION:   PT End of Session - 04/30/24 1028     Visit Number 2    Number of Visits 24    Date for PT Re-Evaluation 07/19/24    PT Start Time 1023    PT Stop Time 1105    PT Time Calculation (min) 42 min    Equipment Utilized During Treatment Gait belt    Activity Tolerance Patient tolerated treatment well    Behavior During Therapy WFL for tasks assessed/performed           Past Medical History:  Diagnosis Date   Anemia    HX of   Arthritis    Bipolar 1 disorder (HCC)    Cancer (HCC)    skin cancer   Cataracts, bilateral    Diabetes mellitus without complication (HCC)    Dyspnea    on exertion (Pt states, out of shape)   Function kidney decreased    High cholesterol    Hypertension    Hypothyroidism    Psoriasis    Psoriatic arthritis (HCC)    Thyroid  disease    Past Surgical History:  Procedure Laterality Date   CATARACT EXTRACTION W/PHACO Left 01/10/2020   Procedure: CATARACT EXTRACTION PHACO AND INTRAOCULAR LENS PLACEMENT (IOC) LEFT DIABETIC;  Surgeon: Ferol Rogue, MD;  Location: [facility];  Service: Ophthalmology;  Laterality: Left;  5.32 0:41.5   CATARACT EXTRACTION W/PHACO Right 02/07/2020   Procedure: CATARACT EXTRACTION PHACO AND INTRAOCULAR LENS PLACEMENT (IOC) RIGHT DIABETIC VISION BLUE 4.01 00:27.4;  Surgeon: Ferol Rogue, MD;  Location: [facility];  Service: Ophthalmology;  Laterality: Right;  Diabetic - oral meds   COLONOSCOPY WITH PROPOFOL  N/A 03/04/2016   Procedure: COLONOSCOPY WITH PROPOFOL ;  Surgeon: Lamar ONEIDA Holmes, MD;  Location: [facility];  Service: Endoscopy;  Laterality: N/A;   great toe amputation     matrixectomy     TONSILLECTOMY     Patient Active Problem List   Diagnosis Date Noted    Hypothyroidism 03/26/2022   UTI (urinary tract infection) 03/26/2022   Pneumonia due to COVID-19 virus 10/22/2020   Bipolar disorder (HCC) 10/22/2020   Acute encephalopathy 10/22/2020   Benign hypertensive kidney disease with chronic kidney disease 07/11/2019   Secondary hyperparathyroidism of renal origin (HCC) 07/11/2019   Class 1 obesity due to excess calories with serious comorbidity and body mass index (BMI) of 32.0 to 32.9 in adult 05/09/2019   Type 2 diabetes mellitus with stage 3 chronic kidney disease, without long-term current use of insulin  (HCC) 09/16/2017   Stage 3 chronic kidney disease (HCC) 10/04/2016   Essential hypertriglyceridemia 03/22/2016   Essential hypertension 03/17/2016    ONSET DATE: 2 years ago  REFERRING DIAG: Chronic right shoulder pain [M25.511, G89.29], Leg weakness, bilateral [R29.898]   THERAPY DIAG:  Muscle weakness (generalized)  Unsteadiness on feet  Difficulty in walking, not elsewhere classified  Right shoulder pain, unspecified chronicity  Other lack of coordination  Stiffness of right shoulder, not elsewhere classified  Other abnormalities of gait and mobility  Rationale for Evaluation and Treatment: Rehabilitation  SUBJECTIVE:  SUBJECTIVE STATEMENT:  Pt arrives late to therapy appointment due to walking from the hospital entrance to therapy clinic using her personal rollator. Patient proud of herself for this accomplishment! Denies pain, reports she takes tylenol  to manage it. Denies stumbles/falls.   From Initial Eval: Pt presents to clinic today in transport chair. Pt has had consistent falls for the last 2 years, but she reports not having fallen in the last 6 months. Pt uses a 4WW at home c the brakes constantly on to avoid the rollator rolling  outside her BOS. Pt stated she does not like 2WW as she fell over one when attempting to rush to the toilet one night. Pt states she has dizzy spells, particularly when coming from a sit to stand or with head turns during gait. Pt also stated her dizziness has tapered off recently as well. She reports all these started around when she was hospitalized for covid and PNA 2 years ago. Pt has a bed rail in her bed. Installed it after falling out of bed multiple times. Pt has very limited shoulder ROM. She reports her husband helps around a lot at home with ADLs, BADLS, and IADLs like washing her hair. However, she feels his help is prohibiting her from getting better and requested us  to explain to him that she needs to attempt more tasks on her own.   Pt accompanied by: self and husband, [place], waiting in front office  PERTINENT HISTORY: Per last PA note: Past Medical History:  Diagnosis Date  Aortic atherosclerosis ()  on CT scan 10/2020  Basal cell carcinoma 2017  left side of nose s/p Moh's surgery  Bipolar disorder (CMS/HHS-HCC)  Cataracts, bilateral  Chronic kidney disease (CKD), stage III (moderate) (CMS/HHS-HCC)  Dyslipidemia  Hypothyroidism  Osteoarthritis  Psoriasis  Psoriatic arthritis (CMS/HHS-HCC)  Psoriatic arthritis (CMS/HHS-HCC)  Type 2 diabetes mellitus (CMS/HHS-HCC)  Diabetic peripheral neuropathy with hx of diabetic foot ulcer.    PAIN:  Are you having pain? No  PRECAUTIONS: Fall  RED FLAGS: None   WEIGHT BEARING RESTRICTIONS: No  FALLS: Has patient fallen in last 6 months? No  LIVING ENVIRONMENT: Lives with: lives with their spouse Lives in: House/apartment Stairs: No Has following equipment at home: Single point cane, Environmental consultant - 2 wheeled, and Environmental consultant - 4 wheeled  PLOF: Independent  PATIENT GOALS: Would like to walk without AD, Work up strength in L and R arm.   OBJECTIVE:  Note: Objective measures were completed at Evaluation unless otherwise  noted.  DIAGNOSTIC FINDINGS: Per Imaging note from 03/26/2022:    Narrative & Impression  CLINICAL DATA:  Humerus fracture yesterday. Clemens again. Known proximal humerus fracture.   EXAM: RIGHT SHOULDER - 2+ VIEW   COMPARISON:  Reportedly right shoulder radiographs were performed 03/25/2022 (yesterday) however are unavailable.   FINDINGS: There is a markedly comminuted and moderately displaced fracture of the right humeral surgical neck. On frontal view there appears to be approximately 1.5 cm fracture line craniocaudal diastasis. There is a cortical fragment measuring up to 2 cm that appears to be displaced medially 8 mm.   There is minimal varus angulation of the fracture. On transscapular Y-view there appears to be anterior angulation of the fracture and anterior displacement of the distal fracture component with respect to the proximal fracture component. There appears to be volume loss from a cortical fracture of the superolateral humeral head greater tuberosity.   The humeral head remains appropriately located with respect of the glenoid.   Mild acromioclavicular  joint space narrowing and peripheral osteophytosis.   IMPRESSION: 1. Markedly comminuted and moderately displaced fracture of the right humeral surgical neck. 2. Likely additional comminuted greater tuberosity of the humeral head fracture.    COGNITION: Overall cognitive status: Within functional limits for tasks assessed   POSTURE: rounded shoulders, forward head, increased lumbar lordosis, posterior pelvic tilt, and flexed trunk    LOWER EXTREMITY MMT:    MMT Right Eval Left Eval  Hip flexion 4 4  Hip extension    Hip abduction 4+ 4+  Hip adduction    Hip internal rotation    Hip external rotation    Knee flexion 5 4  Knee extension 4 4-  Ankle dorsiflexion 4 4  Ankle plantarflexion    Ankle inversion    Ankle eversion    (Blank rows = not tested)    GAIT: Findings: Gait  Characteristics: decreased step length- Right, decreased step length- Left, decreased stride length, decreased hip/knee flexion- Right, decreased hip/knee flexion- Left, shuffling, festinating, poor foot clearance- Right, and poor foot clearance- Left, Distance walked: 10 Ft, Assistive device utilized:Walker - 4 wheeled, Level of assistance: CGA, and Comments:    FUNCTIONAL TESTS:  5 times sit to stand: 33.09 seconds B UE support Timed up and go (TUG): 42.64 sec c Rollator 2 minute walk test: Not yet Tested 10 meter walk test: Not yet tested     PATIENT SURVEYS:  ABC scale: The Activities-Specific Balance Confidence (ABC) Scale 0% 10 20 30  40 50 60 70 80 90 100% No confidence<->completely confident  "How confident are you that you will not lose your balance or become unsteady when you . . .   Date tested 04/26/2024  Walk around the house 80%  2. Walk up or down stairs 0%  3. Bend over and pick up a slipper from in front of a closet floor 30%  4. Reach for a small can off a shelf at eye level 80%  5. Stand on tip toes and reach for something above your head 10%  6. Stand on a chair and reach for something 0%  7. Sweep the floor 70%  8. Walk outside the house to a car parked in the driveway 30%  9. Get into or out of a car 70%  10. Walk across a parking lot to the mall 70%  11. Walk up or down a ramp 30%  12. Walk in a crowded mall where people rapidly walk past you 10%  13. Are bumped into by people as you walk through the mall 0%  14. Step onto or off of an escalator while you are holding onto the railing 0%  15. Step onto or off an escalator while holding onto parcels such that you cannot hold onto the railing 0%  16. Walk outside on icy sidewalks 0%  Total: #/16 30%  TREATMENT DATE:  04/30/24   Pt requires seated rest break in waiting area before being  able to ambulate back to clinic space using her personal rollator with the brakes locked the entire time. Pt demos excessive forward trunk flexed posture with short step lengths bilaterally that progressively worsen over time, with fatigue, as well as pt having heavy foot fall on initial contact landing on midfoot. Therapist providing close SBA for safety.    10 Meter Walk Test: Patient instructed to walk 10 meters (32.8 ft) as quickly and as safely as possible at their normal speed x2 and at a fast speed x2. Time measured from 2 meter mark to 8 meter mark to accommodate ramp-up and ramp-down.  Normal speed 1: 0.39 m/s (25.47 seconds) Normal speed 2: 0.31 m/s (32.10 seconds) Average Normal speed: 0.35 m/s using her personal rollator with brakes locked and close SBA/CGA for safety Cut off scores: <0.4 m/s = household Ambulator, 0.4-0.8 m/s = limited community Ambulator, >0.8 m/s = community Ambulator, >1.2 m/s = crossing a street, <1.0 = increased fall risk MCID 0.05 m/s (small), 0.13 m/s (moderate), 0.06 m/s (significant)  (ANPTA Core Set of Outcome Measures for Adults with Neurologic Conditions, 2018)  Patient expresses concern that her apartment is too cluttered for her to safely ambulate around in.   2 Min Walk Test:  Instructed patient to ambulate as quickly and as safely as possible for 2 minutes using LRAD. Patient was allowed to take standing rest breaks without stopping the test, but if the patient required a sitting rest break the clock would be stopped and the test would be over.  Results: 103 feet and had to stop at 18min40seconds due to fatigue (31.39 meters, Avg speed 0.65m/s) using her personal rollator with brakes locked and CGA for safety. Results indicate that the patient has reduced endurance with ambulation compared to age matched norms.    Noticed pt's rollator handles are low - assessed and determined it would be better if they were raised 1 level. Adjusted height of pt's  rollator handles; however, not able to safely secure them at that height for long term use, as her rollator is not made to be taller. Did attempted ~58ft short distance ambulation with taller rollator and pt's posture was improved some amount.   Performed 1 set of the below exercise with pt demonstrating proper form/technique. Provided HEP printout; however, this will need to be expanded upon at upcoming visits.   Educated pt's husband on need to ensure home is de-cluttered so patient is able to safely ambulate using her rollator. Educated pt and husband on plan to further assess the most appropriate AD for patient to use and will let them know at upcoming visits. Also, discussed that if it is determined the rollator is the most safe AD for patient to use, then she may benefit from one that is slightly taller. Discussed with patient the potential of using RW; however, pt seems resistant to the idea at this time due to hx of having fallen forward over this AD and injuring her R shoulder/arm.  Transported patient out to front office in transport chair as pt reporting fatigue and not feeling safe ambulating back.     PATIENT EDUCATION: Education details: POC Person educated: Patient Education method: Explanation Education comprehension: verbalized understanding   HOME EXERCISE PROGRAM: Access Code: [email or web address] URL: [email or web address] Date: 04/30/2024 Prepared by: Connell Kiss  Exercises - Sit to Stand with Counter Support  - 1 x daily - 7 x  weekly - 2 sets - 5 reps     GOALS: Goals reviewed with patient? Yes  SHORT TERM GOALS: Target date: 07/19/2024   Consistently perform HEP at least 4x/week to maintain progress made in PT to improve function and overall QoL. Baseline: Initiated on 04/30/2024 Goal status: INITIAL   LONG TERM GOALS: Target date: 07/19/2024    Pt will decrease her STS time by 10 seconds to improve power and strength which will increase QoL.   Baseline: 33.69 sec B UE support Goal status: INITIAL  2.  Pt will decrease her TUG time by 10 seconds to reduce risk of falls and increase QoL.  Baseline: 42.64 sec with rollator Goal status: INITIAL   3.  Pt will decrease time by 0.25 m/s in order to increase gait speed to reduce fall risk. Baseline: 04/30/2024: 0.35 m/s using her personal rollator with brakes locked and close SBA/CGA for safety Goal status: INITIAL  4.  Pt will increase distance walked during to aid with community ambulation and reduce fall risk. Baseline: 04/30/2024: 103 feet, had to stop at 68min40seconds due to fatigue, (31.39 meters, Avg speed 0.55m/s) using her personal rollator with brakes locked and CGA for safety Goal status: INITIAL  5.  Pt will increase ABC score to 50% to increase her confidence with daily activities and improve overall QoL. Baseline: 30% Goal status: INITIAL  ASSESSMENT:  CLINICAL IMPRESSION: Patient is a 63 y.o. F who was seen today for physical therapy treatment for balance impairments, B LE weakness, and R shoulder immobility. Therapy session focused on assessment of and walk test. Patient demonstrates significantly impaired gait speed on as well as significantly impaired gait mechanics placing patient at high risk for falling. Patient continues to demo excessive forward trunk flexed posture and short step lengths that worsen with fatigue. Patient also with severe gait endurance deficits as pt unable to ambulate 2 minutes prior to needing a seated rest break. Therapist provides education to pt and husband on need to further assess which LRAD that will be safe for patient and ensuring home environment is decluttered and safe for patient to ambulate. Pt will continue to benefit from skilled physical therapy intervention to address impairments, improve QOL, and attain therapy goals.    OBJECTIVE IMPAIRMENTS: Abnormal gait, decreased activity tolerance, decreased  balance, decreased coordination, decreased knowledge of use of DME, decreased mobility, difficulty walking, decreased ROM, decreased strength, dizziness, and improper body mechanics.   ACTIVITY LIMITATIONS: carrying, lifting, bending, standing, squatting, transfers, bed mobility, bathing, and locomotion level  PARTICIPATION LIMITATIONS: meal prep, cleaning, laundry, medication management, driving, shopping, community activity, and yard work  PERSONAL FACTORS: Past/current experiences, Time since onset of injury/illness/exacerbation, and 1-2 comorbidities:   are also affecting patient's functional outcome.   REHAB POTENTIAL: Good  CLINICAL DECISION MAKING: Evolving/moderate complexity  EVALUATION COMPLEXITY: Moderate  PLAN:  PT FREQUENCY: 2x/week  PT DURATION: 12 weeks  PLANNED INTERVENTIONS: 97110-Therapeutic exercises, 97530- Therapeutic activity, 97112- Neuromuscular re-education, 97535- Self Care, [id number]- Manual therapy, [phone]- Gait training, Patient/Family education, Joint mobilization, and DME instructions  PLAN FOR NEXT SESSION:  - follow-up on OT referral - add to initial HEP - B LE functional strength training  - focus on upright posture, improved hip flexion activation to lift LEs and hip extension to maintain stance stability  - Gait training in //bars without UE support      Veda Arrellano, PT, DPT, NCS, CSRS Physical Therapist - [place]  [facility]  11:06 AM 04/30/24

## 2024-05-02 ENCOUNTER — Ambulatory Visit

## 2024-05-02 DIAGNOSIS — R278 Other lack of coordination: Secondary | ICD-10-CM

## 2024-05-02 DIAGNOSIS — M25511 Pain in right shoulder: Secondary | ICD-10-CM

## 2024-05-02 DIAGNOSIS — R2681 Unsteadiness on feet: Secondary | ICD-10-CM

## 2024-05-02 DIAGNOSIS — N390 Urinary tract infection, site not specified: Secondary | ICD-10-CM | POA: Diagnosis not present

## 2024-05-02 DIAGNOSIS — R2689 Other abnormalities of gait and mobility: Secondary | ICD-10-CM

## 2024-05-02 DIAGNOSIS — N183 Chronic kidney disease, stage 3 unspecified: Secondary | ICD-10-CM | POA: Diagnosis not present

## 2024-05-02 DIAGNOSIS — M6281 Muscle weakness (generalized): Secondary | ICD-10-CM | POA: Diagnosis not present

## 2024-05-02 DIAGNOSIS — E039 Hypothyroidism, unspecified: Secondary | ICD-10-CM | POA: Diagnosis not present

## 2024-05-02 DIAGNOSIS — M25611 Stiffness of right shoulder, not elsewhere classified: Secondary | ICD-10-CM

## 2024-05-02 DIAGNOSIS — R262 Difficulty in walking, not elsewhere classified: Secondary | ICD-10-CM

## 2024-05-02 DIAGNOSIS — E1122 Type 2 diabetes mellitus with diabetic chronic kidney disease: Secondary | ICD-10-CM | POA: Diagnosis not present

## 2024-05-02 NOTE — Therapy (Signed)
 OUTPATIENT PHYSICAL THERAPY NEURO TREATMENT   Patient Name: Stacey Ball MRN: [id number] DOB:1961/05/24, 63 y.o., female Today's Date: 05/02/2024   PCP: Alla Amis MD REFERRING PROVIDER: Jonette, Minor  END OF SESSION:   PT End of Session - 05/02/24 1406     Visit Number 3    Number of Visits 24    Date for PT Re-Evaluation 07/19/24    PT Start Time 1405    PT Stop Time 1445    PT Time Calculation (min) 40 min    Equipment Utilized During Treatment Gait belt    Activity Tolerance Patient tolerated treatment well    Behavior During Therapy WFL for tasks assessed/performed           Past Medical History:  Diagnosis Date   Anemia    HX of   Arthritis    Bipolar 1 disorder (HCC)    Cancer (HCC)    skin cancer   Cataracts, bilateral    Diabetes mellitus without complication (HCC)    Dyspnea    on exertion (Pt states, out of shape)   Function kidney decreased    High cholesterol    Hypertension    Hypothyroidism    Psoriasis    Psoriatic arthritis (HCC)    Thyroid  disease    Past Surgical History:  Procedure Laterality Date   CATARACT EXTRACTION W/PHACO Left 01/10/2020   Procedure: CATARACT EXTRACTION PHACO AND INTRAOCULAR LENS PLACEMENT (IOC) LEFT DIABETIC;  Surgeon: Ferol Rogue, MD;  Location: [facility];  Service: Ophthalmology;  Laterality: Left;  5.32 0:41.5   CATARACT EXTRACTION W/PHACO Right 02/07/2020   Procedure: CATARACT EXTRACTION PHACO AND INTRAOCULAR LENS PLACEMENT (IOC) RIGHT DIABETIC VISION BLUE 4.01 00:27.4;  Surgeon: Ferol Rogue, MD;  Location: [facility];  Service: Ophthalmology;  Laterality: Right;  Diabetic - oral meds   COLONOSCOPY WITH PROPOFOL  N/A 03/04/2016   Procedure: COLONOSCOPY WITH PROPOFOL ;  Surgeon: Lamar ONEIDA Holmes, MD;  Location: [facility];  Service: Endoscopy;  Laterality: N/A;   great toe amputation     matrixectomy     TONSILLECTOMY     Patient Active Problem List   Diagnosis Date Noted    Hypothyroidism 03/26/2022   UTI (urinary tract infection) 03/26/2022   Pneumonia due to COVID-19 virus 10/22/2020   Bipolar disorder (HCC) 10/22/2020   Acute encephalopathy 10/22/2020   Benign hypertensive kidney disease with chronic kidney disease 07/11/2019   Secondary hyperparathyroidism of renal origin (HCC) 07/11/2019   Class 1 obesity due to excess calories with serious comorbidity and body mass index (BMI) of 32.0 to 32.9 in adult 05/09/2019   Type 2 diabetes mellitus with stage 3 chronic kidney disease, without long-term current use of insulin  (HCC) 09/16/2017   Stage 3 chronic kidney disease (HCC) 10/04/2016   Essential hypertriglyceridemia 03/22/2016   Essential hypertension 03/17/2016    ONSET DATE: 2 years ago  REFERRING DIAG: Chronic right shoulder pain [M25.511, G89.29], Leg weakness, bilateral [R29.898]   THERAPY DIAG:  Muscle weakness (generalized)  Unsteadiness on feet  Difficulty in walking, not elsewhere classified  Right shoulder pain, unspecified chronicity  Other lack of coordination  Stiffness of right shoulder, not elsewhere classified  Other abnormalities of gait and mobility  Rationale for Evaluation and Treatment: Rehabilitation  SUBJECTIVE:  SUBJECTIVE STATEMENT: Pt denies falls. Has been compliant with HEP. Agreeable to trial RW use compared to her rollator.   From Initial Eval: Pt presents to clinic today in transport chair. Pt has had consistent falls for the last 2 years, but she reports not having fallen in the last 6 months. Pt uses a 4WW at home c the brakes constantly on to avoid the rollator rolling outside her BOS. Pt stated she does not like 2WW as she fell over one when attempting to rush to the toilet one night. Pt states she has dizzy spells,  particularly when coming from a sit to stand or with head turns during gait. Pt also stated her dizziness has tapered off recently as well. She reports all these started around when she was hospitalized for covid and PNA 2 years ago. Pt has a bed rail in her bed. Installed it after falling out of bed multiple times. Pt has very limited shoulder ROM. She reports her husband helps around a lot at home with ADLs, BADLS, and IADLs like washing her hair. However, she feels his help is prohibiting her from getting better and requested us  to explain to him that she needs to attempt more tasks on her own.   Pt accompanied by: self and husband, [place], waiting in front office  PERTINENT HISTORY: Per last PA note: Past Medical History:  Diagnosis Date  Aortic atherosclerosis ()  on CT scan 10/2020  Basal cell carcinoma 2017  left side of nose s/p Moh's surgery  Bipolar disorder (CMS/HHS-HCC)  Cataracts, bilateral  Chronic kidney disease (CKD), stage III (moderate) (CMS/HHS-HCC)  Dyslipidemia  Hypothyroidism  Osteoarthritis  Psoriasis  Psoriatic arthritis (CMS/HHS-HCC)  Psoriatic arthritis (CMS/HHS-HCC)  Type 2 diabetes mellitus (CMS/HHS-HCC)  Diabetic peripheral neuropathy with hx of diabetic foot ulcer.    PAIN:  Are you having pain? No  PRECAUTIONS: Fall  RED FLAGS: None   WEIGHT BEARING RESTRICTIONS: No  FALLS: Has patient fallen in last 6 months? No  LIVING ENVIRONMENT: Lives with: lives with their spouse Lives in: House/apartment Stairs: No Has following equipment at home: Single point cane, Environmental consultant - 2 wheeled, and Environmental consultant - 4 wheeled  PLOF: Independent  PATIENT GOALS: Would like to walk without AD, Work up strength in L and R arm.   OBJECTIVE:  Note: Objective measures were completed at Evaluation unless otherwise noted.  DIAGNOSTIC FINDINGS: Per Imaging note from 03/26/2022:    Narrative & Impression  CLINICAL DATA:  Humerus fracture yesterday. Clemens again. Known proximal  humerus fracture.   EXAM: RIGHT SHOULDER - 2+ VIEW   COMPARISON:  Reportedly right shoulder radiographs were performed 03/25/2022 (yesterday) however are unavailable.   FINDINGS: There is a markedly comminuted and moderately displaced fracture of the right humeral surgical neck. On frontal view there appears to be approximately 1.5 cm fracture line craniocaudal diastasis. There is a cortical fragment measuring up to 2 cm that appears to be displaced medially 8 mm.   There is minimal varus angulation of the fracture. On transscapular Y-view there appears to be anterior angulation of the fracture and anterior displacement of the distal fracture component with respect to the proximal fracture component. There appears to be volume loss from a cortical fracture of the superolateral humeral head greater tuberosity.   The humeral head remains appropriately located with respect of the glenoid.   Mild acromioclavicular joint space narrowing and peripheral osteophytosis.   IMPRESSION: 1. Markedly comminuted and moderately displaced fracture of the right humeral surgical neck.  2. Likely additional comminuted greater tuberosity of the humeral head fracture.    COGNITION: Overall cognitive status: Within functional limits for tasks assessed   POSTURE: rounded shoulders, forward head, increased lumbar lordosis, posterior pelvic tilt, and flexed trunk    LOWER EXTREMITY MMT:    MMT Right Eval Left Eval  Hip flexion 4 4  Hip extension    Hip abduction 4+ 4+  Hip adduction    Hip internal rotation    Hip external rotation    Knee flexion 5 4  Knee extension 4 4-  Ankle dorsiflexion 4 4  Ankle plantarflexion    Ankle inversion    Ankle eversion    (Blank rows = not tested)    GAIT: Findings: Gait Characteristics: decreased step length- Right, decreased step length- Left, decreased stride length, decreased hip/knee flexion- Right, decreased hip/knee flexion- Left,  shuffling, festinating, poor foot clearance- Right, and poor foot clearance- Left, Distance walked: 10 Ft, Assistive device utilized:Walker - 4 wheeled, Level of assistance: CGA, and Comments:    FUNCTIONAL TESTS:  5 times sit to stand: 33.09 seconds B UE support Timed up and go (TUG): 42.64 sec c Rollator 2 minute walk test: Not yet Tested 10 meter walk test: Not yet tested     PATIENT SURVEYS:  ABC scale: The Activities-Specific Balance Confidence (ABC) Scale 0% 10 20 30  40 50 60 70 80 90 100% No confidence<->completely confident  "How confident are you that you will not lose your balance or become unsteady when you . . .   Date tested 04/26/2024  Walk around the house 80%  2. Walk up or down stairs 0%  3. Bend over and pick up a slipper from in front of a closet floor 30%  4. Reach for a small can off a shelf at eye level 80%  5. Stand on tip toes and reach for something above your head 10%  6. Stand on a chair and reach for something 0%  7. Sweep the floor 70%  8. Walk outside the house to a car parked in the driveway 30%  9. Get into or out of a car 70%  10. Walk across a parking lot to the mall 70%  11. Walk up or down a ramp 30%  12. Walk in a crowded mall where people rapidly walk past you 10%  13. Are bumped into by people as you walk through the mall 0%  14. Step onto or off of an escalator while you are holding onto the railing 0%  15. Step onto or off an escalator while holding onto parcels such that you cannot hold onto the railing 0%  16. Walk outside on icy sidewalks 0%  Total: #/16 30%                                                                                                                                 TREATMENT DATE:  05/02/24  Gait training:   2x100' with RW. PT demo on step lengths, benefits of RW compared to rollator with regards to stability and pt's shuffled gait and anterior trunk lean onto AD. ~50% improvement on second lap. Mod VC's for step  lengths but mostly for keeping body with RW for upright posture.    Education and demo on safe turning practices. Prior to education pt keeps R foot outside of RW during turns with anterior trunk lean.    Neuro Re-Ed:  Standing alternating hip flexion at RW: 3x8    Seated scap retractions with spine unsupported:     Updated these exercises to HEP. Discussed safe completion along with reps/sets/frequency     PATIENT EDUCATION:  Education details: POC  Person educated: Patient  Education method: Explanation  Education comprehension: verbalized understanding   HOME EXERCISE PROGRAM: Access Code: [email or web address] URL: [email or web address] Date: 05/02/2024 Prepared by: Dorina Kingfisher  Exercises - Sit to Stand with Counter Support  - 1 x daily - 7 x weekly - 2 sets - 5 reps - Standing March with Counter Support  - 1 x daily - 7 x weekly - 3 sets - 8 reps - Seated Scapular Retraction  - 1 x daily - 7 x weekly - 3 sets - 12 reps   Access Code: [email or web address] URL: [email or web address] Date: 04/30/2024 Prepared by: Connell Kiss  Exercises  - Sit to Stand with Counter Support  - 1 x daily - 7 x weekly - 2 sets - 5 reps     GOALS: Goals reviewed with patient? Yes  SHORT TERM GOALS: Target date: 07/19/2024   Consistently perform HEP at least 4x/week to maintain progress made in PT to improve function and overall QoL. Baseline: Initiated on 04/30/2024 Goal status: INITIAL   LONG TERM GOALS: Target date: 07/19/2024    Pt will decrease her STS time by 10 seconds to improve power and strength which will increase QoL.  Baseline: 33.69 sec B UE support Goal status: INITIAL  2.  Pt will decrease her TUG time by 10 seconds to reduce risk of falls and increase QoL.  Baseline: 42.64 sec with rollator Goal status: INITIAL   3.  Pt will decrease time by 0.25 m/s in order to increase gait speed to reduce fall risk. Baseline: 04/30/2024: 0.35 m/s using her  personal rollator with brakes locked and close SBA/CGA for safety Goal status: INITIAL  4.  Pt will increase distance walked during to aid with community ambulation and reduce fall risk. Baseline: 04/30/2024: 103 feet, had to stop at 66min40seconds due to fatigue, (31.39 meters, Avg speed 0.60m/s) using her personal rollator with brakes locked and CGA for safety Goal status: INITIAL  5.  Pt will increase ABC score to 50% to increase her confidence with daily activities and improve overall QoL. Baseline: 30% Goal status: INITIAL  ASSESSMENT:  CLINICAL IMPRESSION: Continuing PT POC working on Psychiatric nurse HEP. Pt remains with poor step lengths and foot clearance along with anterior trunk lean on RW. These mechanics are leading to shuffled gait pattern and high falls risk. Pt does respond well to PT demo and VC's for improving upright postural and step through gait with ~50% improvement. Continuing to educate on RW likely better form of AD for her compared to her rollator. Updated HEP to address forward posture and poor foot clearance. All questions answered with updated HEP provided. Pt will continue to benefit from skilled physical therapy intervention to address impairments, improve QOL, and attain  therapy goals.    OBJECTIVE IMPAIRMENTS: Abnormal gait, decreased activity tolerance, decreased balance, decreased coordination, decreased knowledge of use of DME, decreased mobility, difficulty walking, decreased ROM, decreased strength, dizziness, and improper body mechanics.   ACTIVITY LIMITATIONS: carrying, lifting, bending, standing, squatting, transfers, bed mobility, bathing, and locomotion level  PARTICIPATION LIMITATIONS: meal prep, cleaning, laundry, medication management, driving, shopping, community activity, and yard work  PERSONAL FACTORS: Past/current experiences, Time since onset of injury/illness/exacerbation, and 1-2 comorbidities:   are also affecting patient's  functional outcome.   REHAB POTENTIAL: Good  CLINICAL DECISION MAKING: Evolving/moderate complexity  EVALUATION COMPLEXITY: Moderate  PLAN:  PT FREQUENCY: 2x/week  PT DURATION: 12 weeks  PLANNED INTERVENTIONS: 97110-Therapeutic exercises, 97530- Therapeutic activity, 97112- Neuromuscular re-education, 97535- Self Care, [id number]- Manual therapy, [phone]- Gait training, Patient/Family education, Joint mobilization, and DME instructions  PLAN FOR NEXT SESSION:  - follow-up on OT referral - add to initial HEP - B LE functional strength training  - focus on upright posture, improved hip flexion activation to lift LEs and hip extension to maintain stance stability  - Gait training in //bars without UE support     Dorina HERO. Fairly IV, PT, DPT Physical Therapist- [place]  [facility] 3:22 PM 05/02/24

## 2024-05-07 ENCOUNTER — Ambulatory Visit

## 2024-05-07 ENCOUNTER — Ambulatory Visit: Admitting: Physical Therapy

## 2024-05-07 DIAGNOSIS — M6281 Muscle weakness (generalized): Secondary | ICD-10-CM

## 2024-05-07 DIAGNOSIS — R262 Difficulty in walking, not elsewhere classified: Secondary | ICD-10-CM

## 2024-05-07 DIAGNOSIS — N390 Urinary tract infection, site not specified: Secondary | ICD-10-CM | POA: Diagnosis not present

## 2024-05-07 DIAGNOSIS — R278 Other lack of coordination: Secondary | ICD-10-CM

## 2024-05-07 DIAGNOSIS — G8929 Other chronic pain: Secondary | ICD-10-CM

## 2024-05-07 DIAGNOSIS — R2681 Unsteadiness on feet: Secondary | ICD-10-CM

## 2024-05-07 NOTE — Therapy (Signed)
 OUTPATIENT PHYSICAL THERAPY NEURO TREATMENT   Patient Name: Stacey Ball MRN: [id number] DOB:[birth year], 63 y.o., female Today's Date: 05/07/2024   PCP: Alla Amis MD REFERRING PROVIDER: Jonette, Minor  END OF SESSION:   PT End of Session - 05/07/24 1022     Visit Number 4    Number of Visits 24    Date for PT Re-Evaluation 07/19/24    PT Start Time 1020    PT Stop Time 1058    PT Time Calculation (min) 38 min    Equipment Utilized During Treatment Gait belt    Activity Tolerance Patient tolerated treatment well    Behavior During Therapy WFL for tasks assessed/performed            Past Medical History:  Diagnosis Date   Anemia    HX of   Arthritis    Bipolar 1 disorder (HCC)    Cancer (HCC)    skin cancer   Cataracts, bilateral    Diabetes mellitus without complication (HCC)    Dyspnea    on exertion (Pt states, out of shape)   Function kidney decreased    High cholesterol    Hypertension    Hypothyroidism    Psoriasis    Psoriatic arthritis (HCC)    Thyroid  disease    Past Surgical History:  Procedure Laterality Date   CATARACT EXTRACTION W/PHACO Left 01/10/2020   Procedure: CATARACT EXTRACTION PHACO AND INTRAOCULAR LENS PLACEMENT (IOC) LEFT DIABETIC;  Surgeon: Ferol Rogue, MD;  Location: [facility];  Service: Ophthalmology;  Laterality: Left;  5.32 0:41.5   CATARACT EXTRACTION W/PHACO Right 02/07/2020   Procedure: CATARACT EXTRACTION PHACO AND INTRAOCULAR LENS PLACEMENT (IOC) RIGHT DIABETIC VISION BLUE 4.01 00:27.4;  Surgeon: Ferol Rogue, MD;  Location: [facility];  Service: Ophthalmology;  Laterality: Right;  Diabetic - oral meds   COLONOSCOPY WITH PROPOFOL  N/A 03/04/2016   Procedure: COLONOSCOPY WITH PROPOFOL ;  Surgeon: Lamar ONEIDA Holmes, MD;  Location: [facility];  Service: Endoscopy;  Laterality: N/A;   great toe amputation     matrixectomy     TONSILLECTOMY     Patient Active Problem List   Diagnosis Date Noted    Hypothyroidism 03/26/2022   UTI (urinary tract infection) 03/26/2022   Pneumonia due to COVID-19 virus 10/22/2020   Bipolar disorder (HCC) 10/22/2020   Acute encephalopathy 10/22/2020   Benign hypertensive kidney disease with chronic kidney disease 07/11/2019   Secondary hyperparathyroidism of renal origin (HCC) 07/11/2019   Class 1 obesity due to excess calories with serious comorbidity and body mass index (BMI) of 32.0 to 32.9 in adult 05/09/2019   Type 2 diabetes mellitus with stage 3 chronic kidney disease, without long-term current use of insulin  (HCC) 09/16/2017   Stage 3 chronic kidney disease (HCC) 10/04/2016   Essential hypertriglyceridemia 03/22/2016   Essential hypertension 03/17/2016    ONSET DATE: 2 years ago  REFERRING DIAG: Chronic right shoulder pain [M25.511, G89.29], Leg weakness, bilateral [R29.898]   THERAPY DIAG:  Muscle weakness (generalized)  Unsteadiness on feet  Difficulty in walking, not elsewhere classified  Rationale for Evaluation and Treatment: Rehabilitation  SUBJECTIVE:  SUBJECTIVE STATEMENT:  Pt reports she is doing good. Denies pain. Reports she has a runner rug in her hallway that is starting to come up and her husband took double sided tape for it to be held down. Pt reports it was tripping her causing stumbles, but no falls.   Pt reports their dining room is full of stuff so she cannot mobilize in that room of their home.   Pt reports she did her HEP with no difficulties and reports liking the exercises.  Pt reports she is primarily using the rollator in the home because she only has very short distances to walk. However, pt states she would be open to using RW.    From Initial Eval: Pt presents to clinic today in transport chair. Pt has had consistent falls  for the last 2 years, but she reports not having fallen in the last 6 months. Pt uses a 4WW at home c the brakes constantly on to avoid the rollator rolling outside her BOS. Pt stated she does not like 2WW as she fell over one when attempting to rush to the toilet one night. Pt states she has dizzy spells, particularly when coming from a sit to stand or with head turns during gait. Pt also stated her dizziness has tapered off recently as well. She reports all these started around when she was hospitalized for covid and PNA 2 years ago. Pt has a bed rail in her bed. Installed it after falling out of bed multiple times. Pt has very limited shoulder ROM. She reports her husband helps around a lot at home with ADLs, BADLS, and IADLs like washing her hair. However, she feels his help is prohibiting her from getting better and requested us  to explain to him that she needs to attempt more tasks on her own.   Pt accompanied by: self and husband, [place], waiting in front office  PERTINENT HISTORY: Per last PA note: Past Medical History:  Diagnosis Date  Aortic atherosclerosis ()  on CT scan 10/2020  Basal cell carcinoma 2017  left side of nose s/p Moh's surgery  Bipolar disorder (CMS/HHS-HCC)  Cataracts, bilateral  Chronic kidney disease (CKD), stage III (moderate) (CMS/HHS-HCC)  Dyslipidemia  Hypothyroidism  Osteoarthritis  Psoriasis  Psoriatic arthritis (CMS/HHS-HCC)  Psoriatic arthritis (CMS/HHS-HCC)  Type 2 diabetes mellitus (CMS/HHS-HCC)  Diabetic peripheral neuropathy with hx of diabetic foot ulcer.    PAIN:  Are you having pain? No  PRECAUTIONS: Fall  RED FLAGS: None   WEIGHT BEARING RESTRICTIONS: No  FALLS: Has patient fallen in last 6 months? No  LIVING ENVIRONMENT: Lives with: lives with their spouse Lives in: House/apartment Stairs: No Has following equipment at home: Single point cane, Environmental consultant - 2 wheeled, and Environmental consultant - 4 wheeled  PLOF: Independent  PATIENT GOALS: Would like  to walk without AD, Work up strength in L and R arm.   OBJECTIVE:  Note: Objective measures were completed at Evaluation unless otherwise noted.  DIAGNOSTIC FINDINGS: Per Imaging note from 03/26/2022:    Narrative & Impression  CLINICAL DATA:  Humerus fracture yesterday. Clemens again. Known proximal humerus fracture.   EXAM: RIGHT SHOULDER - 2+ VIEW   COMPARISON:  Reportedly right shoulder radiographs were performed 03/25/2022 (yesterday) however are unavailable.   FINDINGS: There is a markedly comminuted and moderately displaced fracture of the right humeral surgical neck. On frontal view there appears to be approximately 1.5 cm fracture line craniocaudal diastasis. There is a cortical fragment measuring up to 2 cm that  appears to be displaced medially 8 mm.   There is minimal varus angulation of the fracture. On transscapular Y-view there appears to be anterior angulation of the fracture and anterior displacement of the distal fracture component with respect to the proximal fracture component. There appears to be volume loss from a cortical fracture of the superolateral humeral head greater tuberosity.   The humeral head remains appropriately located with respect of the glenoid.   Mild acromioclavicular joint space narrowing and peripheral osteophytosis.   IMPRESSION: 1. Markedly comminuted and moderately displaced fracture of the right humeral surgical neck. 2. Likely additional comminuted greater tuberosity of the humeral head fracture.    COGNITION: Overall cognitive status: Within functional limits for tasks assessed   POSTURE: rounded shoulders, forward head, increased lumbar lordosis, posterior pelvic tilt, and flexed trunk    LOWER EXTREMITY MMT:    MMT Right Eval Left Eval  Hip flexion 4 4  Hip extension    Hip abduction 4+ 4+  Hip adduction    Hip internal rotation    Hip external rotation    Knee flexion 5 4  Knee extension 4 4-  Ankle  dorsiflexion 4 4  Ankle plantarflexion    Ankle inversion    Ankle eversion    (Blank rows = not tested)    GAIT: Findings: Gait Characteristics: decreased step length- Right, decreased step length- Left, decreased stride length, decreased hip/knee flexion- Right, decreased hip/knee flexion- Left, shuffling, festinating, poor foot clearance- Right, and poor foot clearance- Left, Distance walked: 10 Ft, Assistive device utilized:Walker - 4 wheeled, Level of assistance: CGA, and Comments:    FUNCTIONAL TESTS:  5 times sit to stand: 33.09 seconds B UE support Timed up and go (TUG): 42.64 sec c Rollator 2 minute walk test: Not yet Tested 10 meter walk test: Not yet tested     PATIENT SURVEYS:  ABC scale: The Activities-Specific Balance Confidence (ABC) Scale 0% 10 20 30  40 50 60 70 80 90 100% No confidence<->completely confident  "How confident are you that you will not lose your balance or become unsteady when you . . .   Date tested 04/26/2024  Walk around the house 80%  2. Walk up or down stairs 0%  3. Bend over and pick up a slipper from in front of a closet floor 30%  4. Reach for a small can off a shelf at eye level 80%  5. Stand on tip toes and reach for something above your head 10%  6. Stand on a chair and reach for something 0%  7. Sweep the floor 70%  8. Walk outside the house to a car parked in the driveway 30%  9. Get into or out of a car 70%  10. Walk across a parking lot to the mall 70%  11. Walk up or down a ramp 30%  12. Walk in a crowded mall where people rapidly walk past you 10%  13. Are bumped into by people as you walk through the mall 0%  14. Step onto or off of an escalator while you are holding onto the railing 0%  15. Step onto or off an escalator while holding onto parcels such that you cannot hold onto the railing 0%  16. Walk outside on icy sidewalks 0%  Total: #/16 30%  TREATMENT DATE:  05/07/24   Pt received in bathroom from OT. Pt arrived to session in transport chair.   In // bars with mirror feedback, performed the following gait training and dynamic balance interventions:  Forward walking with light B UE support progressed to goal of hovering hands  5 laps When hovering hands, pt starts to revert to more heavy foot landing on initial contact, but able to improve with cuing Achieves min reciprocal pattern throughout Side stepping with light B UE support progressed to goal of hands hovering 3 laps  Cuing not to turn hips towards direction she is stepping for true lateral stepping Very slow steps Alternating B LE foot taps to brown step with B UE support 2x 10 reps per LE Cuing for light UE support, but pt does require it to be successful with task  Pt does well maintaining upright posture With fatigue, has decreased foot clearance on foot taps   Gait training ~16ft x2 (seated break between) using RW with CGA/light min assist for steadying. Cuing to maintain upright posture, quiet foot landings as pt tends to stomp on initial contact, for increased step lengths, and upright posture. Significant improvement in posture on 1st walk compared to last time seen by this therapist; however, starts to decrease on 2nd walk with fatigue.    PATIENT EDUCATION:  Education details: POC  Person educated: Patient  Education method: Explanation  Education comprehension: verbalized understanding   HOME EXERCISE PROGRAM: Access Code: [email or web address] URL: [email or web address] Date: 05/02/2024 Prepared by: Dorina Kingfisher  Exercises - Sit to Stand with Counter Support  - 1 x daily - 7 x weekly - 2 sets - 5 reps - Standing March with Counter Support  - 1 x daily - 7 x weekly - 3 sets - 8 reps - Seated Scapular Retraction  - 1 x daily - 7 x weekly - 3 sets - 12 reps   Access Code: [email or web address] URL:  [email or web address] Date: 04/30/2024 Prepared by: Connell Kiss  Exercises  - Sit to Stand with Counter Support  - 1 x daily - 7 x weekly - 2 sets - 5 reps     GOALS: Goals reviewed with patient? Yes  SHORT TERM GOALS: Target date: 07/19/2024   Consistently perform HEP at least 4x/week to maintain progress made in PT to improve function and overall QoL. Baseline: Initiated on 04/30/2024 Goal status: INITIAL   LONG TERM GOALS: Target date: 07/19/2024    Pt will decrease her STS time by 10 seconds to improve power and strength which will increase QoL.  Baseline: 33.69 sec B UE support Goal status: INITIAL  2.  Pt will decrease her TUG time by 10 seconds to reduce risk of falls and increase QoL.  Baseline: 42.64 sec with rollator Goal status: INITIAL   3.  Pt will decrease time by 0.25 m/s in order to increase gait speed to reduce fall risk. Baseline: 04/30/2024: 0.35 m/s using her personal rollator with brakes locked and close SBA/CGA for safety Goal status: INITIAL  4.  Pt will increase distance walked during to aid with community ambulation and reduce fall risk. Baseline: 04/30/2024: 103 feet, had to stop at 25min40seconds due to fatigue, (31.39 meters, Avg speed 0.35m/s) using her personal rollator with brakes locked and CGA for safety Goal status: INITIAL  5.  Pt will increase ABC score to 50% to increase her confidence with daily activities and improve overall QoL. Baseline: 30% Goal status: INITIAL  ASSESSMENT:  CLINICAL IMPRESSION:  Therapy session focused on gait training in // bars to decrease reliance on BUE support while providing patient with mirror feedback for improved upright posture. Patient demos improved upright posture today with improved foot clearance and min reciprocal stepping pattern. Patient has ability to carry these improvements over into 1st gait trial using RW; however, starts to decrease on 2nd gait trial when becoming  fatigued. Patient will benefit from reinforcement of these improvements with goal of improving endurance to maintain them. Pt will continue to benefit from skilled physical therapy intervention to address impairments, improve QOL, and attain therapy goals.      OBJECTIVE IMPAIRMENTS: Abnormal gait, decreased activity tolerance, decreased balance, decreased coordination, decreased knowledge of use of DME, decreased mobility, difficulty walking, decreased ROM, decreased strength, dizziness, and improper body mechanics.   ACTIVITY LIMITATIONS: carrying, lifting, bending, standing, squatting, transfers, bed mobility, bathing, and locomotion level  PARTICIPATION LIMITATIONS: meal prep, cleaning, laundry, medication management, driving, shopping, community activity, and yard work  PERSONAL FACTORS: Past/current experiences, Time since onset of injury/illness/exacerbation, and 1-2 comorbidities:   are also affecting patient's functional outcome.   REHAB POTENTIAL: Good  CLINICAL DECISION MAKING: Evolving/moderate complexity  EVALUATION COMPLEXITY: Moderate  PLAN:  PT FREQUENCY: 2x/week  PT DURATION: 12 weeks  PLANNED INTERVENTIONS: 97110-Therapeutic exercises, 97530- Therapeutic activity, 97112- Neuromuscular re-education, 97535- Self Care, [id number]- Manual therapy, [phone]- Gait training, Patient/Family education, Joint mobilization, and DME instructions  PLAN FOR NEXT SESSION:  - add to initial HEP - B LE functional strength training  - focus on upright posture, improved hip flexion activation to lift LEs and hip extension to maintain stance stability  - Gait training in // bars without UE support - Gait endurance using RW - continued education on benefits of RW vs rollator     Chairty Toman, PT, DPT, NCS, CSRS Physical Therapist - [facility]  [facility]  11:05 AM 05/07/24

## 2024-05-07 NOTE — Addendum Note (Signed)
 Addended by: Lotus Santillo K on: 05/07/2024 01:05 PM   Modules accepted: Orders

## 2024-05-07 NOTE — Therapy (Addendum)
 OUTPATIENT OCCUPATIONAL THERAPY ORTHO EVALUATION  Patient Name: Stacey Ball MRN: [id number] DOB:[birth year], 63 y.o., female Today's Date: 05/07/2024  PCP: Dr. Alda Helling REFERRING PROVIDER: Dr. Alda Helling  END OF SESSION:  OT End of Session - 05/07/24 1026     Visit Number 1    Number of Visits 24    Date for OT Re-Evaluation 07/30/24    Progress Note Due on Visit 10    OT Start Time 0933    OT Stop Time 1015    OT Time Calculation (min) 42 min    Equipment Utilized During Treatment transport chair    Activity Tolerance Patient tolerated treatment well    Behavior During Therapy WFL for tasks assessed/performed         Past Medical History:  Diagnosis Date   Anemia    HX of   Arthritis    Bipolar 1 disorder (HCC)    Cancer (HCC)    skin cancer   Cataracts, bilateral    Diabetes mellitus without complication (HCC)    Dyspnea    on exertion (Pt states, out of shape)   Function kidney decreased    High cholesterol    Hypertension    Hypothyroidism    Psoriasis    Psoriatic arthritis (HCC)    Thyroid  disease    Past Surgical History:  Procedure Laterality Date   CATARACT EXTRACTION W/PHACO Left 01/10/2020   Procedure: CATARACT EXTRACTION PHACO AND INTRAOCULAR LENS PLACEMENT (IOC) LEFT DIABETIC;  Surgeon: Ferol Rogue, MD;  Location: [facility];  Service: Ophthalmology;  Laterality: Left;  5.32 0:41.5   CATARACT EXTRACTION W/PHACO Right 02/07/2020   Procedure: CATARACT EXTRACTION PHACO AND INTRAOCULAR LENS PLACEMENT (IOC) RIGHT DIABETIC VISION BLUE 4.01 00:27.4;  Surgeon: Ferol Rogue, MD;  Location: [facility];  Service: Ophthalmology;  Laterality: Right;  Diabetic - oral meds   COLONOSCOPY WITH PROPOFOL  N/A 03/04/2016   Procedure: COLONOSCOPY WITH PROPOFOL ;  Surgeon: Lamar ONEIDA Holmes, MD;  Location: [facility];  Service: Endoscopy;  Laterality: N/A;   great toe amputation     matrixectomy     TONSILLECTOMY     Patient  Active Problem List   Diagnosis Date Noted   Hypothyroidism 03/26/2022   UTI (urinary tract infection) 03/26/2022   Pneumonia due to COVID-19 virus 10/22/2020   Bipolar disorder (HCC) 10/22/2020   Acute encephalopathy 10/22/2020   Benign hypertensive kidney disease with chronic kidney disease 07/11/2019   Secondary hyperparathyroidism of renal origin (HCC) 07/11/2019   Class 1 obesity due to excess calories with serious comorbidity and body mass index (BMI) of 32.0 to 32.9 in adult 05/09/2019   Type 2 diabetes mellitus with stage 3 chronic kidney disease, without long-term current use of insulin  (HCC) 09/16/2017   Stage 3 chronic kidney disease (HCC) 10/04/2016   Essential hypertriglyceridemia 03/22/2016   Essential hypertension 03/17/2016   ONSET DATE: 03/26/22  REFERRING DIAG: M25.511,G89.29 (ICD-10-CM) - Chronic right shoulder pain   THERAPY DIAG:  Muscle weakness (generalized)  Other lack of coordination  Chronic right shoulder pain  Rationale for Evaluation and Treatment: Rehabilitation  SUBJECTIVE:  SUBJECTIVE STATEMENT: Pt reports that she thinks she's worse off now than when she attempted OT before.  Pt could not remember her reasoning for stopping OT in 2023, but she reports that she was stubborn and thinks that if she would have kept up with OT previously, she feels like she would have been doing better with her arm than she is now. Pt accompanied by:  self  PERTINENT HISTORY:  Pt returns today to restart therapy for her R shoulder.  Pt was seen by this therapist in 2023 post R proximal humerus fx, non-surgical.  Pt attended 4 OT visits in 2023, but then self discharged, though she doesn't recall her reasoning for stopping.  Pt did admit that she was stubborn, and should have kept going, as she remains quite limited with her shoulder ROM. Pt also currently being seen by outpatient PT  to address balance/mobility deficits d/t hx of falling.  Per chart on 03/26/22, 63 y/o old  female with PMH significant for bipolar disorder, anemia, cataracts, hyperlipidemia, hypertension, hypothyroidism, obesity, type 2 diabetes, CKD stage IIIb presented in the ED with c/o: generalized  weakness and recurrent falls. Patient has generalized weakness for some time which has gotten worse.  She had a fall yesterday and was evaluated outpatient and found to have right humeral fracture and was placed in a sling.  Patient had additional fall yesterday, denies any loss of consciousness or head trauma or head injury. In the ED work-up revealed UA positive for UTI.  Lactic acid normal.  x-ray right shoulder shows humeral neck fracture. ED physician spoke with orthopedics to discuss need for urgent evaluation.  Patient was admitted for generalized weakness secondary to UTI,  started on IV antibiotics.  Orthopedics consulted,  recommended conservative management. Patient is not a candidate for surgical intervention.  Advised outpatient follow-up.  Patient completed antibiotics for 3 days. PT recommended  skilled nursing facility for rehab.  Patient feels better and want to be discharged.  Patient being discharged skilled nursing facility for rehab.   PRECAUTIONS: Fall  RED FLAGS: None   WEIGHT BEARING RESTRICTIONS: No  PAIN: 0/10 pain at rest or activity in R shoulder, but does report pain in bilat knees and low back  Are you having pain? Yes: NPRS scale: 6/10  Pain location: low back and bilat knees Pain description: achy Aggravating factors: prolonged standing/walking Relieving factors: rest, Tylenol   FALLS: Has patient fallen in last 6 months? Yes. Number of falls pt unsure, but reports 6 falls, but within more than 6 months   LIVING ENVIRONMENT: Lives with: lives with their spouse Lives in: first floor apt  Stairs: No Has following equipment at home: Counselling psychologist, Environmental consultant - 2 wheeled, Environmental consultant - 4 wheeled, Wheelchair (manual), Shower bench, bed side commode, and Grab bars, bed rail    PLOF:  Independent with basic ADLs, spouse and pt shared IADL responsibilities prior to R shoulder fx in 2023  PATIENT GOALS: I would like to get better movement in my shoulder.   NEXT MD VISIT: Pt unsure, Not for awhile.  OBJECTIVE:  Note: Objective measures were completed at Evaluation unless otherwise noted.  HAND DOMINANCE: Right  ADLs: Overall ADLs: spouse provides assist as needed.  Pt reports, He's doing a lot now.  Transfers/ambulation related to ADLs: supv-modified indep with AD Eating: indep  Grooming: uses L non-dominant hand to brush hair; able to use R dominant hand to brush teeth with electric toothbrush Upper body dressing: set up-modified indep with donning sports bra and shirt overhead  Lower body dressing: occasional help with clothing set up, extra time and effort to don socks, slip on shoes, indep with panties and pants  Toileting: 3in1 over toilet, uses L hand at baseline for pericare; has tried a toilet wand in the past, but minimally effective  Bathing: spouse helps to wash hair and back, spouse helps to wash buttocks  Tub shower transfers: supv-min A for lifting legs over tub shower with pt using transfer tub bench and grab bar  Equipment: see above  UPPER EXTREMITY ROM:     Active ROM Right eval Left Eval WNL throughout  Shoulder flexion 30 (155)   Shoulder abduction 51 (153)   Shoulder adduction    Shoulder extension    Shoulder internal rotation ~thumb to L4-5    Shoulder external rotation 5 (90) arm held in 90 abd by OT   Elbow flexion    Elbow extension    Wrist flexion    Wrist extension    Wrist ulnar deviation    Wrist radial deviation    Wrist pronation    Wrist supination    (Blank rows = not tested)  UPPER EXTREMITY MMT:     MMT Right eval Left eval  Shoulder flexion 2 4+  Shoulder abduction 2 4+  Shoulder adduction    Shoulder extension    Shoulder internal rotation 4- 4+  Shoulder external rotation 2 4-  Middle  trapezius    Lower trapezius    Elbow flexion 4 4+  Elbow extension 5 4+  Wrist flexion 5 4+  Wrist extension 5 4+  Wrist ulnar deviation    Wrist radial deviation    Wrist pronation    Wrist supination    (Blank rows = not tested)  HAND FUNCTION: Grip strength: Right: 37 lbs; Left: 37 lbs, Lateral pinch: Right: 13 lbs, Left: 9 lbs, and 3 point pinch: Right: 9 lbs, Left: 6 lbs  COORDINATION: NT at eval d/t time constraints; will test in upcoming sessions if indicated  SENSATION:  WFL  EDEMA: No visible edema  COGNITION: Overall cognitive status: History of cognitive impairments - at baseline, decreased memory, hx of bipolar disorder   OBSERVATIONS:  Pt pleasant, cooperative, verbalizes eagerness to improve R shoulder mobility.  TREATMENT DATE: 05/07/24 Evaluation completed.                                                                                                                            PATIENT EDUCATION: Education details: OT role, goals, poc Person educated: Patient Education method: Explanation Education comprehension: verbalized understanding  HOME EXERCISE PROGRAM: To be initiated in upcoming sessions  GOALS: Goals reviewed with patient? Yes  SHORT TERM GOALS: Target date: 06/18/24  Pt will be indep with HEP for increasing strength and ROM in the R shoulder. Baseline: Eval: To be initiated in upcoming sessions Goal status: INITIAL  2.  Pt will identify and implement 2-3 activity modifications/AE in order to compensate for RUE weakness in order to maximize indep with BADLs.  Baseline: Eval: Educ not yet initiated  Goal status: INITIAL  LONG TERM GOALS: Target date: 07/30/24  1.  Pt will increase R active shoulder flexion and abd to 70 degrees or better to better engage the RUE into BADLs. Baseline: Eval: R shoulder flex 30, abd 51 Goal status: INITIAL  2.  Pt will increase active R shoulder ER to 30 degrees or better to enable brushing/combing R  side of head with R dominant hand and arm propped as needed. Baseline: Eval: Pt brushes hair with L non-dominant arm; active R shoulder ER 5* with arm propped into abd  Goal status: INITIAL  3.  Pt will increase active R shoulder IR for increased thoroughness with posterior LB bathing.  Baseline: Eval: ~thumb to L4-5; pt reports spouse has to help with posterior in shower  Goal status: INITIAL  3. Pt will increase R shoulder strength by 1/2 muscle grade or more in order to better engage the RUE into BADLs.  Baseline: Eval: R shoulder grossly 2/5  Goal status: INITIAL  ASSESSMENT:  CLINICAL IMPRESSION: Patient is a 63 y.o. female who was seen today for occupational therapy evaluation for functional decline related to R shoulder pain and weakness d/t hx of R proximal humerus fx in June of 2023.  Pt was seen by this OT in Aug of 2023 post fx with goal for increasing RUE shoulder mobility, though pt attended 4 sessions before self discharge.  Pt feels her R shoulder is worse off now, and admits that she should have continued her therapy in 2023.  Pt presents with extensive R shoulder hiking with all attempts at active R shoulder mobility, severely limited (BFL) R shoulder active range even with substitution patterns, WNL for passive shoulder ROM, but reports no pain at end shoulder ranges actively or passively.  Pt verbalizes spouse having to assist more with her self care, including helping her to wash hair, carry heavy laundry baskets, and assist with reaching to place items in the microwave as a result of pt's limited shoulder strength and ROM.  Pt compensates with L non-dominant hand to perform a variety of ADLs, see ADL section above for details.  Pt reports that her goal for OT is to increase R shoulder mobility in order to maximize indep with daily tasks and reduce physical burden of care on spouse.  Pt will benefit from skilled OT to address above noted deficits and goals noted above in OT poc.   Pt will continue to see PT for balance/mobility deficits.  Pt in agreement with OT plan.  PERFORMANCE DEFICITS: in functional skills including ADLs, IADLs, coordination, ROM, strength, pain, flexibility, Gross motor control, mobility, balance, body mechanics, decreased knowledge of precautions, decreased knowledge of use of DME, and UE functional use, cognitive skills including memory, and psychosocial skills including coping strategies, environmental adaptation, habits, and routines and behaviors.   IMPAIRMENTS: are limiting patient from ADLs, IADLs, and leisure.   COMORBIDITIES: has co-morbidities such as bipolar disorder, CKD, HTN, DM2, arthritis that affects occupational performance. Patient will benefit from skilled OT to address above impairments and improve overall function.  MODIFICATION OR ASSISTANCE TO COMPLETE EVALUATION: Min-Moderate modification of tasks or assist with assess necessary to complete an evaluation.  OT OCCUPATIONAL PROFILE AND HISTORY: Detailed assessment: Review of records and additional review of physical, cognitive, psychosocial history related to current functional performance.  CLINICAL DECISION MAKING: Moderate - several treatment options, min-mod task modification necessary  REHAB POTENTIAL: Fair based on limited participation last episode of OT in 2023  EVALUATION COMPLEXITY: Moderate      PLAN:  OT FREQUENCY: 2x/week  OT DURATION: 12 weeks  PLANNED INTERVENTIONS: 97168 OT Re-evaluation, 97535 self care/ADL training, 02889 therapeutic exercise, 97530 therapeutic activity, 97140 manual therapy, 97010 moist heat, 97010 cryotherapy, 97750 Physical Performance Testing, passive range of  motion, psychosocial skills training, energy conservation, coping strategies training, patient/family education, and DME and/or AE instructions  RECOMMENDED OTHER SERVICES: None at this time (Pt currently seeing PT for mobility deficits)  CONSULTED AND AGREED WITH PLAN OF  CARE: Patient  PLAN FOR NEXT SESSION: see above  Stacey Blazing, MS, OTR/L   Stacey Ball, OT 05/07/2024, 10:28 AM

## 2024-05-08 DIAGNOSIS — R350 Frequency of micturition: Secondary | ICD-10-CM | POA: Diagnosis not present

## 2024-05-10 ENCOUNTER — Ambulatory Visit

## 2024-05-10 DIAGNOSIS — G8929 Other chronic pain: Secondary | ICD-10-CM

## 2024-05-10 DIAGNOSIS — M6281 Muscle weakness (generalized): Secondary | ICD-10-CM | POA: Diagnosis not present

## 2024-05-10 DIAGNOSIS — R278 Other lack of coordination: Secondary | ICD-10-CM

## 2024-05-10 DIAGNOSIS — R2681 Unsteadiness on feet: Secondary | ICD-10-CM

## 2024-05-10 DIAGNOSIS — R262 Difficulty in walking, not elsewhere classified: Secondary | ICD-10-CM

## 2024-05-10 NOTE — Therapy (Signed)
 OUTPATIENT PHYSICAL THERAPY TREATMENT   Patient Name: Stacey Ball MRN: [id number] DOB:08-20-1961, 63 y.o., female Today's Date: 05/10/2024  PCP: Alla Amis MD REFERRING PROVIDER: Jonette, Minor  END OF SESSION:   PT End of Session - 05/10/24 1120     Visit Number 5    Number of Visits 24    Date for PT Re-Evaluation 07/19/24    Progress Note Due on Visit 10    PT Start Time 1115   late arrival   PT Stop Time 1140    PT Time Calculation (min) 25 min    Equipment Utilized During Treatment Gait belt    Activity Tolerance Patient tolerated treatment well    Behavior During Therapy WFL for tasks assessed/performed           Past Medical History:  Diagnosis Date   Anemia    HX of   Arthritis    Bipolar 1 disorder (HCC)    Cancer (HCC)    skin cancer   Cataracts, bilateral    Diabetes mellitus without complication (HCC)    Dyspnea    on exertion (Pt states, out of shape)   Function kidney decreased    High cholesterol    Hypertension    Hypothyroidism    Psoriasis    Psoriatic arthritis (HCC)    Thyroid  disease    Past Surgical History:  Procedure Laterality Date   CATARACT EXTRACTION W/PHACO Left 01/10/2020   Procedure: CATARACT EXTRACTION PHACO AND INTRAOCULAR LENS PLACEMENT (IOC) LEFT DIABETIC;  Surgeon: Ferol Rogue, MD;  Location: [facility];  Service: Ophthalmology;  Laterality: Left;  5.32 0:41.5   CATARACT EXTRACTION W/PHACO Right 02/07/2020   Procedure: CATARACT EXTRACTION PHACO AND INTRAOCULAR LENS PLACEMENT (IOC) RIGHT DIABETIC VISION BLUE 4.01 00:27.4;  Surgeon: Ferol Rogue, MD;  Location: [facility];  Service: Ophthalmology;  Laterality: Right;  Diabetic - oral meds   COLONOSCOPY WITH PROPOFOL  N/A 03/04/2016   Procedure: COLONOSCOPY WITH PROPOFOL ;  Surgeon: Lamar ONEIDA Holmes, MD;  Location: [facility];  Service: Endoscopy;  Laterality: N/A;   great toe amputation     matrixectomy     TONSILLECTOMY     Patient Active  Problem List   Diagnosis Date Noted   Hypothyroidism 03/26/2022   UTI (urinary tract infection) 03/26/2022   Pneumonia due to COVID-19 virus 10/22/2020   Bipolar disorder (HCC) 10/22/2020   Acute encephalopathy 10/22/2020   Benign hypertensive kidney disease with chronic kidney disease 07/11/2019   Secondary hyperparathyroidism of renal origin (HCC) 07/11/2019   Class 1 obesity due to excess calories with serious comorbidity and body mass index (BMI) of 32.0 to 32.9 in adult 05/09/2019   Type 2 diabetes mellitus with stage 3 chronic kidney disease, without long-term current use of insulin  (HCC) 09/16/2017   Stage 3 chronic kidney disease (HCC) 10/04/2016   Essential hypertriglyceridemia 03/22/2016   Essential hypertension 03/17/2016    ONSET DATE: 2 years ago  REFERRING DIAG: Chronic right shoulder pain [M25.511, G89.29], Leg weakness, bilateral [R29.898]   THERAPY DIAG:  Muscle weakness (generalized)  Other lack of coordination  Chronic right shoulder pain  Unsteadiness on feet  Difficulty in walking, not elsewhere classified  Rationale for Evaluation and Treatment: Rehabilitation  SUBJECTIVE:  SUBJECTIVE STATEMENT: Pt denies any updates since prior visit. Her knees hurt, are stiff she thinks its arthritis. Pt was sore after last PT session.   PERTINENT HISTORY:  Pt presents to clinic today in transport chair. Pt has had consistent falls for the last 2 years, but she reports not having fallen in the last 6 months. Pt uses a 4WW at home c the brakes constantly on to avoid the rollator rolling outside her BOS. Pt stated she does not like 2WW as she fell over one when attempting to rush to the toilet one night. Pt states she has dizzy spells, particularly when coming from a sit to stand or with  head turns during gait. Pt also stated her dizziness has tapered off recently as well. She reports all these started around when she was hospitalized for covid and PNA 2 years ago. Pt has a bed rail in her bed. Installed it after falling out of bed multiple times. Pt has very limited shoulder ROM. She reports her husband helps around a lot at home with ADL and IADL like washing her hair. However, she feels his help is prohibiting her from getting better and requested us  to explain to him that she needs to attempt more tasks on her own.  PAIN:  Are you having pain? No  PRECAUTIONS: Fall  RED FLAGS: None   WEIGHT BEARING RESTRICTIONS: No  FALLS: Has patient fallen in last 6 months? No  LIVING ENVIRONMENT: Lives with: lives with their spouse Lives in: House/apartment Stairs: No Has following equipment at home: Single point cane, Environmental consultant - 2 wheeled, and Environmental consultant - 4 wheeled  PLOF: Independent  PATIENT GOALS: Would like to walk without AD, Work up strength in L and R arm.   OBJECTIVE:  Note: Objective measures were completed at evaluation unless otherwise noted.                                                                                                                             TREATMENT DATE:  05/10/24   -STS from chair x10 -AMB in // bars x111ft -seated LAQ x15 bilat -STS from chair x10 -AMB side stepping in // bars x161ft -6 step up in // bars x8 (plastic step + airex foam pad)    PATIENT EDUCATION:  Education details: POC  Person educated: Patient  Education method: Explanation  Education comprehension: verbalized understanding  HOME EXERCISE PROGRAM: Access Code: [email or web address] URL: [email or web address] Date: 05/02/2024 Prepared by: Dorina Kingfisher  Exercises - Sit to Stand with Counter Support  - 1 x daily - 7 x weekly - 2 sets - 5 reps - Standing March with Counter Support  - 1 x daily - 7 x weekly - 3 sets - 8 reps - Seated Scapular Retraction  - 1 x  daily - 7 x weekly - 3 sets - 12 reps  Access Code: [email or web address] URL: [email or web address] Date: 04/30/2024 Prepared by: Connell Kiss  Exercises  - Sit to  Stand with Counter Support  - 1 x daily - 7 x weekly - 2 sets - 5 reps  GOALS: Goals reviewed with patient? Yes  SHORT TERM GOALS: Target date: 07/19/2024 Consistently perform HEP at least 4x/week to maintain progress made in PT to improve function and overall QoL. Baseline: Initiated on 04/30/2024 Goal status: INITIAL  LONG TERM GOALS: Target date: 07/19/2024  Pt will decrease her STS time by 10 seconds to improve power and strength which will increase QoL.  Baseline: 33.69 sec B UE support Goal status: INITIAL  2.  Pt will decrease her TUG time by 10 seconds to reduce risk of falls and increase QoL.  Baseline: 42.64 sec with rollator Goal status: INITIAL  3.  Pt will decrease time by 0.25 m/s in order to increase gait speed to reduce fall risk. Baseline: 04/30/2024: 0.35 m/s using her personal rollator with brakes locked and close SBA/CGA for safety Goal status: INITIAL  4.  Pt will increase distance walked during to aid with community ambulation and reduce fall risk. Baseline: 04/30/2024: 103 feet, had to stop at 92min40seconds due to fatigue, (31.39 meters, Avg speed 0.11m/s) using her personal rollator with brakes locked and CGA for safety Goal status: INITIAL  5.  Pt will increase ABC score to 50% to increase her confidence with daily activities and improve overall QoL. Baseline: 30% Goal status: INITIAL  ASSESSMENT:  CLINICAL IMPRESSION:  Gait and strength training today as planned, response as expected. Pt remains generally apprehensive due to falls history. No significant program updates this date. Patient will benefit from skilled physical therapy intervention to reduce deficits and impairments identified in evaluation, in order to reduce pain, improve quality of life, and maximize activity  tolerance for ADL, IADL, and leisure/fitness. Physical therapy will help pt achieve long and short term goals of care.   OBJECTIVE IMPAIRMENTS: Abnormal gait, decreased activity tolerance, decreased balance, decreased coordination, decreased knowledge of use of DME, decreased mobility, difficulty walking, decreased ROM, decreased strength, dizziness, and improper body mechanics.   ACTIVITY LIMITATIONS: carrying, lifting, bending, standing, squatting, transfers, bed mobility, bathing, and locomotion level  PARTICIPATION LIMITATIONS: meal prep, cleaning, laundry, medication management, driving, shopping, community activity, and yard work  PERSONAL FACTORS: Past/current experiences, Time since onset of injury/illness/exacerbation, and 1-2 comorbidities:   are also affecting patient's functional outcome.   REHAB POTENTIAL: Good  CLINICAL DECISION MAKING: Evolving/moderate complexity  EVALUATION COMPLEXITY: Moderate  PLAN:  PT FREQUENCY: 2x/week  PT DURATION: 12 weeks  PLANNED INTERVENTIONS: 97110-Therapeutic exercises, 97530- Therapeutic activity, 97112- Neuromuscular re-education, 97535- Self Care, 02859- Manual therapy, [phone]- Gait training, Patient/Family education, Joint mobilization, and DME instructions  PLAN FOR NEXT SESSION:  - B LE functional strength training - focus on upright posture, improved hip flexion activation to lift LEs and hip extension to maintain stance stability  - Gait training in // bars without UE support - Gait endurance using RW  - continued education on benefits of RW vs rollator   11:23 AM, 05/10/24 Peggye JAYSON Linear, PT, DPT Physical Therapist - [facility]  Outpatient Physical Therapy- Main Campus [phone]

## 2024-05-10 NOTE — Therapy (Signed)
 OUTPATIENT OCCUPATIONAL THERAPY ORTHO EVALUATION  Patient Name: Stacey Ball MRN: [id number] DOB:[birth year], 63 y.o., female Today's Date: 05/07/2024  PCP: Dr. Alda Helling REFERRING PROVIDER: Dr. Alda Helling  END OF SESSION:  OT End of Session - 05/07/24 1026     Visit Number 2    Number of Visits 24    Date for OT Re-Evaluation 07/30/24    Progress Note Due on Visit 10    OT Start Time 1145    OT Stop Time 1230   OT Time Calculation (min) 45 min    Equipment Utilized During Treatment transport chair    Activity Tolerance Patient tolerated treatment well    Behavior During Therapy WFL for tasks assessed/performed         Past Medical History:  Diagnosis Date   Anemia    HX of   Arthritis    Bipolar 1 disorder (HCC)    Cancer (HCC)    skin cancer   Cataracts, bilateral    Diabetes mellitus without complication (HCC)    Dyspnea    on exertion (Pt states, out of shape)   Function kidney decreased    High cholesterol    Hypertension    Hypothyroidism    Psoriasis    Psoriatic arthritis (HCC)    Thyroid  disease    Past Surgical History:  Procedure Laterality Date   CATARACT EXTRACTION W/PHACO Left 01/10/2020   Procedure: CATARACT EXTRACTION PHACO AND INTRAOCULAR LENS PLACEMENT (IOC) LEFT DIABETIC;  Surgeon: Ferol Rogue, MD;  Location: [facility];  Service: Ophthalmology;  Laterality: Left;  5.32 0:41.5   CATARACT EXTRACTION W/PHACO Right 02/07/2020   Procedure: CATARACT EXTRACTION PHACO AND INTRAOCULAR LENS PLACEMENT (IOC) RIGHT DIABETIC VISION BLUE 4.01 00:27.4;  Surgeon: Ferol Rogue, MD;  Location: [facility];  Service: Ophthalmology;  Laterality: Right;  Diabetic - oral meds   COLONOSCOPY WITH PROPOFOL  N/A 03/04/2016   Procedure: COLONOSCOPY WITH PROPOFOL ;  Surgeon: Lamar ONEIDA Holmes, MD;  Location: [facility];  Service: Endoscopy;  Laterality: N/A;   great toe amputation     matrixectomy     TONSILLECTOMY     Patient Active  Problem List   Diagnosis Date Noted   Hypothyroidism 03/26/2022   UTI (urinary tract infection) 03/26/2022   Pneumonia due to COVID-19 virus 10/22/2020   Bipolar disorder (HCC) 10/22/2020   Acute encephalopathy 10/22/2020   Benign hypertensive kidney disease with chronic kidney disease 07/11/2019   Secondary hyperparathyroidism of renal origin (HCC) 07/11/2019   Class 1 obesity due to excess calories with serious comorbidity and body mass index (BMI) of 32.0 to 32.9 in adult 05/09/2019   Type 2 diabetes mellitus with stage 3 chronic kidney disease, without long-term current use of insulin  (HCC) 09/16/2017   Stage 3 chronic kidney disease (HCC) 10/04/2016   Essential hypertriglyceridemia 03/22/2016   Essential hypertension 03/17/2016   ONSET DATE: 03/26/22  REFERRING DIAG: M25.511,G89.29 (ICD-10-CM) - Chronic right shoulder pain   THERAPY DIAG:  Muscle weakness (generalized)  Other lack of coordination  Chronic right shoulder pain  Rationale for Evaluation and Treatment: Rehabilitation  SUBJECTIVE:  SUBJECTIVE STATEMENT: Pt reports her house is in disorder and she wishes her sister would come help organize.  Pt accompanied by: self  PERTINENT HISTORY:  Pt returns today to restart therapy for her R shoulder.  Pt was seen by this therapist in 2023 post R proximal humerus fx, non-surgical.  Pt attended 4 OT visits in 2023, but then self discharged, though she doesn't recall  her reasoning for stopping.  Pt did admit that she was stubborn, and should have kept going, as she remains quite limited with her shoulder ROM. Pt also currently being seen by outpatient PT  to address balance/mobility deficits d/t hx of falling.  Per chart on 03/26/22, 63 y/o old female with PMH significant for bipolar disorder, anemia, cataracts, hyperlipidemia, hypertension, hypothyroidism, obesity, type 2 diabetes, CKD stage IIIb presented in the ED with c/o: generalized  weakness and recurrent falls. Patient has  generalized weakness for some time which has gotten worse.  She had a fall yesterday and was evaluated outpatient and found to have right humeral fracture and was placed in a sling.  Patient had additional fall yesterday, denies any loss of consciousness or head trauma or head injury. In the ED work-up revealed UA positive for UTI.  Lactic acid normal.  x-ray right shoulder shows humeral neck fracture. ED physician spoke with orthopedics to discuss need for urgent evaluation.  Patient was admitted for generalized weakness secondary to UTI,  started on IV antibiotics.  Orthopedics consulted,  recommended conservative management. Patient is not a candidate for surgical intervention.  Advised outpatient follow-up.  Patient completed antibiotics for 3 days. PT recommended  skilled nursing facility for rehab.  Patient feels better and want to be discharged.  Patient being discharged skilled nursing facility for rehab.   PRECAUTIONS: Fall  RED FLAGS: None   WEIGHT BEARING RESTRICTIONS: No  PAIN: 0/10 pain at rest or activity in R shoulder, but does report pain in bilat knees and low back  Are you having pain? Yes: NPRS scale: 6/10  Pain location: low back and bilat knees Pain description: achy Aggravating factors: prolonged standing/walking Relieving factors: rest, Tylenol   FALLS: Has patient fallen in last 6 months? Yes. Number of falls pt unsure, but reports 6 falls, but within more than 6 months   LIVING ENVIRONMENT: Lives with: lives with their spouse Lives in: first floor apt  Stairs: No Has following equipment at home: Counselling psychologist, Environmental consultant - 2 wheeled, Environmental consultant - 4 wheeled, Wheelchair (manual), Shower bench, bed side commode, and Grab bars, bed rail   PLOF:  Independent with basic ADLs, spouse and pt shared IADL responsibilities prior to R shoulder fx in 2023  PATIENT GOALS: I would like to get better movement in my shoulder.   NEXT MD VISIT: Pt unsure, Not for  awhile.  OBJECTIVE:  Note: Objective measures were completed at Evaluation unless otherwise noted.  HAND DOMINANCE: Right  ADLs: Overall ADLs: spouse provides assist as needed.  Pt reports, He's doing a lot now.  Transfers/ambulation related to ADLs: supv-modified indep with AD Eating: indep  Grooming: uses L non-dominant hand to brush hair; able to use R dominant hand to brush teeth with electric toothbrush Upper body dressing: set up-modified indep with donning sports bra and shirt overhead  Lower body dressing: occasional help with clothing set up, extra time and effort to don socks, slip on shoes, indep with panties and pants  Toileting: 3in1 over toilet, uses L hand at baseline for pericare; has tried a toilet wand in the past, but minimally effective  Bathing: spouse helps to wash hair and back, spouse helps to wash buttocks Tub shower transfers: supv-min A for lifting legs over tub shower with pt using transfer tub bench and grab bar  Equipment: see above  UPPER EXTREMITY ROM:     Active ROM Right eval Left Eval WNL throughout  Shoulder flexion 30 (155)  Shoulder abduction 51 (153)   Shoulder adduction    Shoulder extension    Shoulder internal rotation ~thumb to L4-5    Shoulder external rotation 5 (90) arm held in 90 abd by OT   Elbow flexion    Elbow extension    Wrist flexion    Wrist extension    Wrist ulnar deviation    Wrist radial deviation    Wrist pronation    Wrist supination    (Blank rows = not tested)  UPPER EXTREMITY MMT:     MMT Right eval Left eval  Shoulder flexion 2 4+  Shoulder abduction 2 4+  Shoulder adduction    Shoulder extension    Shoulder internal rotation 4- 4+  Shoulder external rotation 2 4-  Middle trapezius    Lower trapezius    Elbow flexion 4 4+  Elbow extension 5 4+  Wrist flexion 5 4+  Wrist extension 5 4+  Wrist ulnar deviation    Wrist radial deviation    Wrist pronation    Wrist supination    (Blank rows =  not tested)  HAND FUNCTION: Grip strength: Right: 37 lbs; Left: 37 lbs, Lateral pinch: Right: 13 lbs, Left: 9 lbs, and 3 point pinch: Right: 9 lbs, Left: 6 lbs  COORDINATION: NT at eval d/t time constraints; will test in upcoming sessions if indicated  05/10/24: 9 hole peg test L 36, R 40  SENSATION:  WFL  EDEMA: No visible edema  COGNITION: Overall cognitive status: History of cognitive impairments - at baseline, decreased memory, hx of bipolar disorder   OBSERVATIONS:  Pt pleasant, cooperative, verbalizes eagerness to improve R shoulder mobility.  TREATMENT DATE: 04/20/24  Therapeutic Exercise: -Seated AAROM exercises 2 set x 15 reps each at tabletop (RUE shoulder flexion, windshield wipers) and seated (B shoulder flexion and horizontal ab/adduction).  -2 set x 10 reps each: Red theraband for tricep extension. 1# dumbbell bicep flexion. Reviewed proper body mechanics to minimize shoulder hiking.  -Completed 9 hole peg test  Self Care:  -Reviewed locating services for assistance cleaning house / organizing to minimize tripping hazards.  -Educated on falls prevention -IND toilet t/f                                                                                                                             PATIENT EDUCATION: Education details: OT role, goals, poc Person educated: Patient Education method: Explanation Education comprehension: verbalized understanding  HOME EXERCISE PROGRAM: To be initiated in upcoming sessions  GOALS: Goals reviewed with patient? Yes  SHORT TERM GOALS: Target date: 06/18/24  Pt will be indep with HEP for increasing strength and ROM in the R shoulder. Baseline: Eval: To be initiated in upcoming sessions Goal status: INITIAL  2.  Pt will identify and implement 2-3 activity modifications/AE in order to compensate for RUE weakness in order to maximize indep with BADLs.  Baseline: Eval: Educ not yet initiated  Goal status: INITIAL  LONG  TERM GOALS: Target date: 07/30/24  1.  Pt will increase R active shoulder flexion and abd to 70 degrees or better to better engage the RUE into BADLs. Baseline: Eval: R shoulder flex 30, abd 51 Goal status: INITIAL   2.  Pt will increase active R shoulder ER to 30 degrees or better to enable brushing/combing R side of head with R dominant hand and arm propped as needed. Baseline: Eval: Pt brushes hair with L non-dominant arm; active R shoulder ER 5* with arm propped into abd  Goal status: INITIAL  3.  Pt will increase active R shoulder IR for increased thoroughness with posterior LB bathing.  Baseline: Eval: ~thumb to L4-5; pt reports spouse has to help with posterior in shower  Goal status: INITIAL  3. Pt will increase R shoulder strength by 1/2 muscle grade or more in order to better engage the RUE into BADLs.  Baseline: Eval: R shoulder grossly 2/5  Goal status: INITIAL  ASSESSMENT:  CLINICAL IMPRESSION: Pt completed 9 hole peg test L 36, R 40.  Pt continues to present with extensive R shoulder hiking with all attempts at active R shoulder mobility. Completed 9 hole peg test L 36, R 40. Initiated HEP with handout for AAROM in sitting and use of table for RUE ROM. Red theraband issued for triceps strengthening, pt reports use of 1# weight at home for bicep curls. Reviewed falls prevetnion strategies. Continues to demonstrate decreased R shoulder mobility and requires assistance from spouse for grooming. Pt will benefit from skilled OT to address above noted deficits and goals noted above in OT poc.    PERFORMANCE DEFICITS: in functional skills including ADLs, IADLs, coordination, ROM, strength, pain, flexibility, Gross motor control, mobility, balance, body mechanics, decreased knowledge of precautions, decreased knowledge of use of DME, and UE functional use, cognitive skills including memory, and psychosocial skills including coping strategies, environmental adaptation, habits, and  routines and behaviors.   IMPAIRMENTS: are limiting patient from ADLs, IADLs, and leisure.   COMORBIDITIES: has co-morbidities such as bipolar disorder, CKD, HTN, DM2, arthritis that affects occupational performance. Patient will benefit from skilled OT to address above impairments and improve overall function.  MODIFICATION OR ASSISTANCE TO COMPLETE EVALUATION: Min-Moderate modification of tasks or assist with assess necessary to complete an evaluation.  OT OCCUPATIONAL PROFILE AND HISTORY: Detailed assessment: Review of records and additional review of physical, cognitive, psychosocial history related to current functional performance.  CLINICAL DECISION MAKING: Moderate - several treatment options, min-mod task modification necessary  REHAB POTENTIAL: Fair based on limited participation last episode of OT in 2023  EVALUATION COMPLEXITY: Moderate      PLAN:  OT FREQUENCY: 2x/week  OT DURATION: 12 weeks  PLANNED INTERVENTIONS: 97168 OT Re-evaluation, 97535 self care/ADL training, 02889 therapeutic exercise, 97530 therapeutic activity, 97140 manual therapy, 97010 moist heat, 97010 cryotherapy, 97750 Physical Performance Testing, passive range of motion, psychosocial skills training, energy conservation, coping strategies training, patient/family education, and DME and/or AE instructions  RECOMMENDED OTHER SERVICES: None at this time (Pt currently seeing PT for mobility deficits)  CONSULTED AND AGREED WITH PLAN OF CARE: Patient  PLAN FOR NEXT SESSION: see above  Elston Slot, M.S. OTR/L  05/10/24, 11:32 AM  ascom [phone]

## 2024-05-14 ENCOUNTER — Ambulatory Visit: Admitting: Physical Therapy

## 2024-05-14 ENCOUNTER — Ambulatory Visit

## 2024-05-14 DIAGNOSIS — M6281 Muscle weakness (generalized): Secondary | ICD-10-CM

## 2024-05-14 DIAGNOSIS — R262 Difficulty in walking, not elsewhere classified: Secondary | ICD-10-CM

## 2024-05-14 DIAGNOSIS — R2681 Unsteadiness on feet: Secondary | ICD-10-CM

## 2024-05-14 DIAGNOSIS — R278 Other lack of coordination: Secondary | ICD-10-CM

## 2024-05-14 DIAGNOSIS — M25511 Pain in right shoulder: Secondary | ICD-10-CM

## 2024-05-14 DIAGNOSIS — G8929 Other chronic pain: Secondary | ICD-10-CM

## 2024-05-14 DIAGNOSIS — M25611 Stiffness of right shoulder, not elsewhere classified: Secondary | ICD-10-CM

## 2024-05-14 DIAGNOSIS — R2689 Other abnormalities of gait and mobility: Secondary | ICD-10-CM

## 2024-05-14 NOTE — Therapy (Signed)
 OUTPATIENT PHYSICAL THERAPY TREATMENT   Patient Name: Stacey Ball MRN: [id number] DOB:10-29-1960, 63 y.o., female Today's Date: 05/14/2024  PCP: Alla Amis MD REFERRING PROVIDER: Jonette, Minor  END OF SESSION:   PT End of Session - 05/14/24 1404     Visit Number 6    Number of Visits 24    Date for PT Re-Evaluation 07/19/24    Progress Note Due on Visit 10    PT Start Time 1404    PT Stop Time 1444    PT Time Calculation (min) 40 min    Equipment Utilized During Treatment Gait belt    Activity Tolerance Patient tolerated treatment well    Behavior During Therapy WFL for tasks assessed/performed            Past Medical History:  Diagnosis Date   Anemia    HX of   Arthritis    Bipolar 1 disorder (HCC)    Cancer (HCC)    skin cancer   Cataracts, bilateral    Diabetes mellitus without complication (HCC)    Dyspnea    on exertion (Pt states, out of shape)   Function kidney decreased    High cholesterol    Hypertension    Hypothyroidism    Psoriasis    Psoriatic arthritis (HCC)    Thyroid  disease    Past Surgical History:  Procedure Laterality Date   CATARACT EXTRACTION W/PHACO Left 01/10/2020   Procedure: CATARACT EXTRACTION PHACO AND INTRAOCULAR LENS PLACEMENT (IOC) LEFT DIABETIC;  Surgeon: Ferol Rogue, MD;  Location: [facility];  Service: Ophthalmology;  Laterality: Left;  5.32 0:41.5   CATARACT EXTRACTION W/PHACO Right 02/07/2020   Procedure: CATARACT EXTRACTION PHACO AND INTRAOCULAR LENS PLACEMENT (IOC) RIGHT DIABETIC VISION BLUE 4.01 00:27.4;  Surgeon: Ferol Rogue, MD;  Location: [facility];  Service: Ophthalmology;  Laterality: Right;  Diabetic - oral meds   COLONOSCOPY WITH PROPOFOL  N/A 03/04/2016   Procedure: COLONOSCOPY WITH PROPOFOL ;  Surgeon: Lamar ONEIDA Holmes, MD;  Location: [facility];  Service: Endoscopy;  Laterality: N/A;   great toe amputation     matrixectomy     TONSILLECTOMY     Patient Active Problem List    Diagnosis Date Noted   Hypothyroidism 03/26/2022   UTI (urinary tract infection) 03/26/2022   Pneumonia due to COVID-19 virus 10/22/2020   Bipolar disorder (HCC) 10/22/2020   Acute encephalopathy 10/22/2020   Benign hypertensive kidney disease with chronic kidney disease 07/11/2019   Secondary hyperparathyroidism of renal origin (HCC) 07/11/2019   Class 1 obesity due to excess calories with serious comorbidity and body mass index (BMI) of 32.0 to 32.9 in adult 05/09/2019   Type 2 diabetes mellitus with stage 3 chronic kidney disease, without long-term current use of insulin  (HCC) 09/16/2017   Stage 3 chronic kidney disease (HCC) 10/04/2016   Essential hypertriglyceridemia 03/22/2016   Essential hypertension 03/17/2016    ONSET DATE: 2 years ago  REFERRING DIAG: Chronic right shoulder pain [M25.511, G89.29], Leg weakness, bilateral [R29.898]   THERAPY DIAG:  Muscle weakness (generalized)  Other lack of coordination  Unsteadiness on feet  Difficulty in walking, not elsewhere classified  Other abnormalities of gait and mobility  Rationale for Evaluation and Treatment: Rehabilitation  SUBJECTIVE:  SUBJECTIVE STATEMENT: Pt reports she is doing good. Reports only pain she has is in her knees (chronic from arthritis), currently rated as 6/10. Denies stumbles/falls. Pt reports she is still using her rollator in the house and it is going OK.    PERTINENT HISTORY:  Pt presents to clinic today in transport chair. Pt has had consistent falls for the last 2 years, but she reports not having fallen in the last 6 months. Pt uses a 4WW at home c the brakes constantly on to avoid the rollator rolling outside her BOS. Pt stated she does not like 2WW as she fell over one when attempting to rush to the toilet  one night. Pt states she has dizzy spells, particularly when coming from a sit to stand or with head turns during gait. Pt also stated her dizziness has tapered off recently as well. She reports all these started around when she was hospitalized for covid and PNA 2 years ago. Pt has a bed rail in her bed. Installed it after falling out of bed multiple times. Pt has very limited shoulder ROM. She reports her husband helps around a lot at home with ADL and IADL like washing her hair. However, she feels his help is prohibiting her from getting better and requested us  to explain to him that she needs to attempt more tasks on her own.  PAIN:  Are you having pain? No  PRECAUTIONS: Fall  RED FLAGS: None   WEIGHT BEARING RESTRICTIONS: No  FALLS: Has patient fallen in last 6 months? No  LIVING ENVIRONMENT: Lives with: lives with their spouse Lives in: House/apartment Stairs: No Has following equipment at home: Single point cane, Environmental consultant - 2 wheeled, and Environmental consultant - 4 wheeled  PLOF: Independent  PATIENT GOALS: Would like to walk without AD, Work up strength in L and R arm.   OBJECTIVE:  Note: Objective measures were completed at evaluation unless otherwise noted.                                                                                                                             TREATMENT DATE:  05/14/24   Pt received in bathroom from OT. Pt arrived to session in transport chair.   Unless otherwise stated, CGA was provided and gait belt donned in order to ensure pt safety throughout session.   In // bars with mirror feedback, performed the following gait training interventions:  Forward walking with light B UE support progressed to hovering hands  1 lap using B UE support, 5 laps with hands hovering, except for when turning at end Pt able to maintain partial reciprocal pattern even when hovering hands today with no reverting back to heavy foot landing Achieves min reciprocal pattern  throughout although slow gait speed and slightly wider BOS Added agility ladder Forward reciprocal stepping pattern Requires B UE support throughout, but cuing to use them as little as possible 4 laps Therapist cuing and facilitating pelvis  moving forward over stance limbs to improve reciprocal pattern  Ladder forces more normal BOS and increase heel strike at initial contact Gait training ~132ft using RW Cuing to carryover improved gait mechanics via imagining ladder on the floor for her to step over Continues to revert back to partial trunk/hip flexed posture with increased WBing through AD when fatigued; however, significantly improved from 2nd visit CGA/light min A for safety/steadying Agility ladder Side stepping down/back 3 laps with B UE support progressed to only L UE support Therapist providing tactile cuing for increased hip abductor and extensor activation in stance to improve stability Cuing to maintain upright trunk posture with hip extension Gait training 170ft using RW Skilled CGA/light min A  Continued cuing to simulate stepping over agility ladder reigns to promote longer step lengths for reciprocal pattern and heel strike on initial contact Cuing to maintain trunk/hip extension for upright posture, improved compared to last gait training with RW during session Pt does not revert back to stomping today! However, when pt distracted in conversation does start to have more of a shuffled gait pattern Will benefit from eventually progressing to dual-task training during gait    PATIENT EDUCATION:  Education details: POC  Person educated: Patient  Education method: Explanation  Education comprehension: verbalized understanding  HOME EXERCISE PROGRAM: Access Code: [email or web address] URL: [email or web address] Date: 05/02/2024 Prepared by: Dorina Kingfisher  Exercises - Sit to Stand with Counter Support  - 1 x daily - 7 x weekly - 2 sets - 5 reps - Standing March with  Counter Support  - 1 x daily - 7 x weekly - 3 sets - 8 reps - Seated Scapular Retraction  - 1 x daily - 7 x weekly - 3 sets - 12 reps  Access Code: [email or web address] URL: [email or web address] Date: 04/30/2024 Prepared by: Connell Kiss  Exercises  - Sit to Stand with Counter Support  - 1 x daily - 7 x weekly - 2 sets - 5 reps  GOALS: Goals reviewed with patient? Yes  SHORT TERM GOALS: Target date: 07/19/2024  Consistently perform HEP at least 4x/week to maintain progress made in PT to improve function and overall QoL. Baseline: Initiated on 04/30/2024 Goal status: INITIAL  LONG TERM GOALS: Target date: 07/19/2024  Pt will decrease her STS time by 10 seconds to improve power and strength which will increase QoL.  Baseline: 33.69 sec B UE support Goal status: INITIAL  2.  Pt will decrease her TUG time by 10 seconds to reduce risk of falls and increase QoL.  Baseline: 42.64 sec with rollator Goal status: INITIAL  3.  Pt will decrease time by 0.25 m/s in order to increase gait speed to reduce fall risk. Baseline: 04/30/2024: 0.35 m/s using her personal rollator with brakes locked and close SBA/CGA for safety Goal status: INITIAL  4.  Pt will increase distance walked during to aid with community ambulation and reduce fall risk. Baseline: 04/30/2024: 103 feet, had to stop at 78min40seconds due to fatigue, (31.39 meters, Avg speed 0.55m/s) using her personal rollator with brakes locked and CGA for safety Goal status: INITIAL  5.  Pt will increase ABC score to 50% to increase her confidence with daily activities and improve overall QoL. Baseline: 30% Goal status: INITIAL  ASSESSMENT:  CLINICAL IMPRESSION:  Therapy session continued to focus on progression of gait training to promote increased step lengths for reciprocal pattern while maintaining trunk/hip/knee extension for upright posture without leaning forward onto AD support. Patient responded really well to  use of  agility ladder in // bars to promote improved B LE gait mechanics; however, does require B UE support in order to participate and be successful with this intervention. Patient does well carrying over those improved gait mechanics when using RW outside of // bars with improving gait endurance to ~269ft wihle pt sustaining upright posture and reciprocal stepping pattern with min/mod cuing. Patient will benefit from progression of dual-task training during gait because noticed she immediately reverts back to shuffled gait when having a conversation while ambulating. The pt will benefit from further skilled PT to improve these deficits in order to increase QOL, decrease fall risk, and increase safety and independence with ADLs and functional mobility.    OBJECTIVE IMPAIRMENTS: Abnormal gait, decreased activity tolerance, decreased balance, decreased coordination, decreased knowledge of use of DME, decreased mobility, difficulty walking, decreased ROM, decreased strength, dizziness, and improper body mechanics.   ACTIVITY LIMITATIONS: carrying, lifting, bending, standing, squatting, transfers, bed mobility, bathing, and locomotion level  PARTICIPATION LIMITATIONS: meal prep, cleaning, laundry, medication management, driving, shopping, community activity, and yard work  PERSONAL FACTORS: Past/current experiences, Time since onset of injury/illness/exacerbation, and 1-2 comorbidities:   are also affecting patient's functional outcome.   REHAB POTENTIAL: Good  CLINICAL DECISION MAKING: Evolving/moderate complexity  EVALUATION COMPLEXITY: Moderate  PLAN:  PT FREQUENCY: 2x/week  PT DURATION: 12 weeks  PLANNED INTERVENTIONS: 97110-Therapeutic exercises, 97530- Therapeutic activity, 97112- Neuromuscular re-education, 97535- Self Care, [id number]- Manual therapy, [phone]- Gait training, Patient/Family education, Joint mobilization, and DME instructions  PLAN FOR NEXT SESSION:  - B LE functional strength  training - focus on upright posture, improved hip flexion activation to lift LEs and hip extension to maintain stance stability  - Gait training in // bars without UE support  - continue use of agility ladder - Gait endurance using RW  - continued education on benefits of RW vs rollator    Tamara Monteith, PT, DPT, NCS, CSRS Physical Therapist - [place]  [facility]  2:49 PM 05/14/24

## 2024-05-15 DIAGNOSIS — Z89421 Acquired absence of other right toe(s): Secondary | ICD-10-CM | POA: Diagnosis not present

## 2024-05-15 DIAGNOSIS — L600 Ingrowing nail: Secondary | ICD-10-CM | POA: Diagnosis not present

## 2024-05-15 DIAGNOSIS — S92515A Nondisplaced fracture of proximal phalanx of left lesser toe(s), initial encounter for closed fracture: Secondary | ICD-10-CM | POA: Diagnosis not present

## 2024-05-15 DIAGNOSIS — M79672 Pain in left foot: Secondary | ICD-10-CM | POA: Diagnosis not present

## 2024-05-15 DIAGNOSIS — E1142 Type 2 diabetes mellitus with diabetic polyneuropathy: Secondary | ICD-10-CM | POA: Diagnosis not present

## 2024-05-15 DIAGNOSIS — B351 Tinea unguium: Secondary | ICD-10-CM | POA: Diagnosis not present

## 2024-05-16 NOTE — Therapy (Signed)
 OUTPATIENT OCCUPATIONAL THERAPY ORTHO TREATMENT  Patient Name: Stacey Ball MRN: [id number] DOB:01/03/1961, 63 y.o., female Today's Date: 05/07/2024  PCP: Dr. Alda Helling REFERRING PROVIDER: Dr. Alda Helling  END OF SESSION:  OT End of Session - 05/16/24 1637     Visit Number 3    Number of Visits 24    Date for OT Re-Evaluation 07/30/24    Progress Note Due on Visit 10    OT Start Time 1315    OT Stop Time 1400    OT Time Calculation (min) 45 min    Equipment Utilized During Treatment transport chair    Activity Tolerance Patient tolerated treatment well    Behavior During Therapy WFL for tasks assessed/performed            Past Medical History:  Diagnosis Date   Anemia    HX of   Arthritis    Bipolar 1 disorder (HCC)    Cancer (HCC)    skin cancer   Cataracts, bilateral    Diabetes mellitus without complication (HCC)    Dyspnea    on exertion (Pt states, out of shape)   Function kidney decreased    High cholesterol    Hypertension    Hypothyroidism    Psoriasis    Psoriatic arthritis (HCC)    Thyroid  disease    Past Surgical History:  Procedure Laterality Date   CATARACT EXTRACTION W/PHACO Left 01/10/2020   Procedure: CATARACT EXTRACTION PHACO AND INTRAOCULAR LENS PLACEMENT (IOC) LEFT DIABETIC;  Surgeon: Ferol Rogue, MD;  Location: [facility];  Service: Ophthalmology;  Laterality: Left;  5.32 0:41.5   CATARACT EXTRACTION W/PHACO Right 02/07/2020   Procedure: CATARACT EXTRACTION PHACO AND INTRAOCULAR LENS PLACEMENT (IOC) RIGHT DIABETIC VISION BLUE 4.01 00:27.4;  Surgeon: Ferol Rogue, MD;  Location: [facility];  Service: Ophthalmology;  Laterality: Right;  Diabetic - oral meds   COLONOSCOPY WITH PROPOFOL  N/A 03/04/2016   Procedure: COLONOSCOPY WITH PROPOFOL ;  Surgeon: Lamar ONEIDA Holmes, MD;  Location: [facility];  Service: Endoscopy;  Laterality: N/A;   great toe amputation     matrixectomy     TONSILLECTOMY     Patient  Active Problem List   Diagnosis Date Noted   Hypothyroidism 03/26/2022   UTI (urinary tract infection) 03/26/2022   Pneumonia due to COVID-19 virus 10/22/2020   Bipolar disorder (HCC) 10/22/2020   Acute encephalopathy 10/22/2020   Benign hypertensive kidney disease with chronic kidney disease 07/11/2019   Secondary hyperparathyroidism of renal origin (HCC) 07/11/2019   Class 1 obesity due to excess calories with serious comorbidity and body mass index (BMI) of 32.0 to 32.9 in adult 05/09/2019   Type 2 diabetes mellitus with stage 3 chronic kidney disease, without long-term current use of insulin  (HCC) 09/16/2017   Stage 3 chronic kidney disease (HCC) 10/04/2016   Essential hypertriglyceridemia 03/22/2016   Essential hypertension 03/17/2016   ONSET DATE: 03/26/22  REFERRING DIAG: M25.511,G89.29 (ICD-10-CM) - Chronic right shoulder pain   THERAPY DIAG:  Muscle weakness (generalized)  Other lack of coordination  Chronic right shoulder pain  Rationale for Evaluation and Treatment: Rehabilitation  SUBJECTIVE:  SUBJECTIVE STATEMENT: See below in today's treatment note for subjective portion. Pt accompanied by: self  PERTINENT HISTORY:  Pt returns today to restart therapy for her R shoulder.  Pt was seen by this therapist in 2023 post R proximal humerus fx, non-surgical.  Pt attended 4 OT visits in 2023, but then self discharged, though she doesn't recall her reasoning for stopping.  Pt did admit that she was stubborn, and should have kept going, as she remains quite limited with her shoulder ROM. Pt also currently being seen by outpatient PT  to address balance/mobility deficits d/t hx of falling.  Per chart on 03/26/22, 63 y/o old female with PMH significant for bipolar disorder, anemia, cataracts, hyperlipidemia, hypertension, hypothyroidism, obesity, type 2 diabetes, CKD stage IIIb presented in the ED with c/o: generalized  weakness and recurrent falls. Patient has generalized weakness  for some time which has gotten worse.  She had a fall yesterday and was evaluated outpatient and found to have right humeral fracture and was placed in a sling.  Patient had additional fall yesterday, denies any loss of consciousness or head trauma or head injury. In the ED work-up revealed UA positive for UTI.  Lactic acid normal.  x-ray right shoulder shows humeral neck fracture. ED physician spoke with orthopedics to discuss need for urgent evaluation.  Patient was admitted for generalized weakness secondary to UTI,  started on IV antibiotics.  Orthopedics consulted,  recommended conservative management. Patient is not a candidate for surgical intervention.  Advised outpatient follow-up.  Patient completed antibiotics for 3 days. PT recommended  skilled nursing facility for rehab.  Patient feels better and want to be discharged.  Patient being discharged skilled nursing facility for rehab.   PRECAUTIONS: Fall  RED FLAGS: None   WEIGHT BEARING RESTRICTIONS: No  PAIN: 0/10 pain at rest or activity in R shoulder, but does report pain in bilat knees and low back  Are you having pain? Yes: NPRS scale: 6/10  Pain location: low back and bilat knees Pain description: achy Aggravating factors: prolonged standing/walking Relieving factors: rest, Tylenol   FALLS: Has patient fallen in last 6 months? Yes. Number of falls pt unsure, but reports 6 falls, but within more than 6 months   LIVING ENVIRONMENT: Lives with: lives with their spouse Lives in: first floor apt  Stairs: No Has following equipment at home: Counselling psychologist, Environmental consultant - 2 wheeled, Environmental consultant - 4 wheeled, Wheelchair (manual), Shower bench, bed side commode, and Grab bars, bed rail   PLOF:  Independent with basic ADLs, spouse and pt shared IADL responsibilities prior to R shoulder fx in 2023  PATIENT GOALS: I would like to get better movement in my shoulder.   NEXT MD VISIT: Pt unsure, Not for awhile.  OBJECTIVE:  Note:  Objective measures were completed at Evaluation unless otherwise noted.  HAND DOMINANCE: Right  ADLs: Overall ADLs: spouse provides assist as needed.  Pt reports, He's doing a lot now.  Transfers/ambulation related to ADLs: supv-modified indep with AD Eating: indep  Grooming: uses L non-dominant hand to brush hair; able to use R dominant hand to brush teeth with electric toothbrush Upper body dressing: set up-modified indep with donning sports bra and shirt overhead  Lower body dressing: occasional help with clothing set up, extra time and effort to don socks, slip on shoes, indep with panties and pants  Toileting: 3in1 over toilet, uses L hand at baseline for pericare; has tried a toilet wand in the past, but minimally effective  Bathing: spouse helps to wash hair and back, spouse helps to wash buttocks Tub shower transfers: supv-min A for lifting legs over tub shower with pt using transfer tub bench and grab bar  Equipment: see above  UPPER EXTREMITY ROM:     Active ROM Right eval Left Eval WNL throughout  Shoulder flexion 30 (155)   Shoulder abduction 51 (153)  Shoulder adduction    Shoulder extension    Shoulder internal rotation ~thumb to L4-5    Shoulder external rotation 5 (90) arm held in 90 abd by OT   Elbow flexion    Elbow extension    Wrist flexion    Wrist extension    Wrist ulnar deviation    Wrist radial deviation    Wrist pronation    Wrist supination    (Blank rows = not tested)  UPPER EXTREMITY MMT:     MMT Right eval Left eval  Shoulder flexion 2 4+  Shoulder abduction 2 4+  Shoulder adduction    Shoulder extension    Shoulder internal rotation 4- 4+  Shoulder external rotation 2 4-  Middle trapezius    Lower trapezius    Elbow flexion 4 4+  Elbow extension 5 4+  Wrist flexion 5 4+  Wrist extension 5 4+  Wrist ulnar deviation    Wrist radial deviation    Wrist pronation    Wrist supination    (Blank rows = not tested)  HAND  FUNCTION: Grip strength: Right: 37 lbs; Left: 37 lbs, Lateral pinch: Right: 13 lbs, Left: 9 lbs, and 3 point pinch: Right: 9 lbs, Left: 6 lbs  COORDINATION: NT at eval d/t time constraints; will test in upcoming sessions if indicated  05/10/24: 9 hole peg test L 36, R 40  SENSATION:  WFL  EDEMA: No visible edema  COGNITION: Overall cognitive status: History of cognitive impairments - at baseline, decreased memory, hx of bipolar disorder   OBSERVATIONS:  Pt pleasant, cooperative, verbalizes eagerness to improve R shoulder mobility.  TREATMENT DATE: 05/14/24  Pt reports she has difficulty with trying to relax her shoulder and not elevate it when attempting to reach for objects.   Therapeutic Exercise: Pt seen for ROM and strengthening exercises with use of 1# dowel on tabletop, pushing dowel forward and back with cues for extended reach towards end range.  Attempted dowel climb with dowel in a vertical position on the tabletop however this was difficult for patient to complete secondary to decreased active movement for shoulder flexion.   Use of washcloth on tabletop for forward flexion, ABD/ADD and diagonal patterns.  Advanced to use of cloth on a wedge for incline motion, forward flexion, diagonal patterns and Z pattern, multiple reps and sets completed with short rest breaks between.  Resistive pinch pins with use of yellow, red and green levels and placing on dowel at tabletop height. Cues to decrease shoulder hiking during session with all active reaching patterns.     Therapeutic Activity:   Pt seen for place and remove of resistive velcro squares working towards functional reaching patterns with verbal and tactile cues.  Pt performing with a tip to tip pinch on the wooden square, additional reps with use of loop and also a lateral pinch grip for removing from resistive board.   ADL:   Focus on handwriting this date with use of regular pen and larger grip pen.  Pt completing her  name, address and a couple of short sentences.  Legibility good with print.  She preferred the regular pen this date.  PATIENT EDUCATION: Education details: OT role, goals, poc Person educated: Patient Education method: Explanation Education comprehension: verbalized understanding  HOME EXERCISE PROGRAM: To be initiated in upcoming sessions  GOALS: Goals reviewed with patient? Yes  SHORT TERM GOALS: Target date: 06/18/24  Pt will be indep with HEP for increasing strength and ROM in the R shoulder. Baseline: Eval: To be initiated in upcoming sessions Goal status: INITIAL  2.  Pt will identify and implement 2-3 activity modifications/AE in order to compensate for RUE weakness in order to maximize indep with BADLs.  Baseline: Eval: Educ not yet initiated  Goal status: INITIAL  LONG TERM GOALS: Target date: 07/30/24  1.  Pt will increase R active shoulder flexion and abd to 70 degrees or better to better engage the RUE into BADLs. Baseline: Eval: R shoulder flex 30, abd 51 Goal status: INITIAL   2.  Pt will increase active R shoulder ER to 30 degrees or better to enable brushing/combing R side of head with R dominant hand and arm propped as needed. Baseline: Eval: Pt brushes hair with L non-dominant arm; active R shoulder ER 5* with arm propped into abd  Goal status: INITIAL  3.  Pt will increase active R shoulder IR for increased thoroughness with posterior LB bathing.  Baseline: Eval: ~thumb to L4-5; pt reports spouse has to help with posterior in shower  Goal status: INITIAL  3. Pt will increase R shoulder strength by 1/2 muscle grade or more in order to better engage the RUE into BADLs.  Baseline: Eval: R shoulder grossly 2/5  Goal status: INITIAL  ASSESSMENT:  CLINICAL IMPRESSION: Pt continues to demonstrate decreased active ROM of right shoulder and  tends to elevate and hike shoulder with all active movement reaching patterns.  She responds well to verbal and tactile cues.  Pt able to demonstrate resistive pinch for 3 levels, yellow, red and green with right hand. She demonstrated difficulty with blue and black levels of pinch.  Pt able to progress to small incline with velcro squares to place and remove using a variety of prehension and pinch patterns.  She did well with handwriting skills today with good legibility and use of a regular pen.  Pt continues to benefit from skilled OT services to maximize safety and independence in necessary daily tasks.     PERFORMANCE DEFICITS: in functional skills including ADLs, IADLs, coordination, ROM, strength, pain, flexibility, Gross motor control, mobility, balance, body mechanics, decreased knowledge of precautions, decreased knowledge of use of DME, and UE functional use, cognitive skills including memory, and psychosocial skills including coping strategies, environmental adaptation, habits, and routines and behaviors.   IMPAIRMENTS: are limiting patient from ADLs, IADLs, and leisure.   COMORBIDITIES: has co-morbidities such as bipolar disorder, CKD, HTN, DM2, arthritis that affects occupational performance. Patient will benefit from skilled OT to address above impairments and improve overall function.  MODIFICATION OR ASSISTANCE TO COMPLETE EVALUATION: Min-Moderate modification of tasks or assist with assess necessary to complete an evaluation.  OT OCCUPATIONAL PROFILE AND HISTORY: Detailed assessment: Review of records and additional review of physical, cognitive, psychosocial history related to current functional performance.  CLINICAL DECISION MAKING: Moderate - several treatment options, min-mod task modification necessary  REHAB POTENTIAL: Fair based on limited participation last episode of OT in 2023  EVALUATION COMPLEXITY: Moderate    PLAN:  OT FREQUENCY: 2x/week  OT DURATION: 12  weeks  PLANNED INTERVENTIONS: 97168 OT Re-evaluation, 97535 self care/ADL training, 02889 therapeutic exercise, 97530 therapeutic activity, 97140 manual  therapy, 97010 moist heat, 97010 cryotherapy, 97750 Physical Performance Testing, passive range of motion, psychosocial skills training, energy conservation, coping strategies training, patient/family education, and DME and/or AE instructions  RECOMMENDED OTHER SERVICES: None at this time (Pt currently seeing PT for mobility deficits)  CONSULTED AND AGREED WITH PLAN OF CARE: Patient  PLAN FOR NEXT SESSION: see above  Leauna Sharber T Denijah Karrer, OTR/L, CLT 05/16/2024, 4:41 PM

## 2024-05-17 ENCOUNTER — Ambulatory Visit

## 2024-05-17 ENCOUNTER — Ambulatory Visit: Admitting: Occupational Therapy

## 2024-05-17 DIAGNOSIS — M6281 Muscle weakness (generalized): Secondary | ICD-10-CM | POA: Diagnosis not present

## 2024-05-17 DIAGNOSIS — R278 Other lack of coordination: Secondary | ICD-10-CM

## 2024-05-17 DIAGNOSIS — R2689 Other abnormalities of gait and mobility: Secondary | ICD-10-CM

## 2024-05-17 DIAGNOSIS — R262 Difficulty in walking, not elsewhere classified: Secondary | ICD-10-CM

## 2024-05-17 DIAGNOSIS — R2681 Unsteadiness on feet: Secondary | ICD-10-CM

## 2024-05-17 NOTE — Therapy (Addendum)
 OUTPATIENT OCCUPATIONAL THERAPY ORTHO TREATMENT  Patient Name: Stacey Ball MRN: [id number] DOB:[birth year], 63 y.o., female Today's Date: 05/07/2024  PCP: Dr. Alda Helling REFERRING PROVIDER: Dr. Alda Helling  END OF SESSION:  OT End of Session - 05/17/24 1624     Visit Number 4    Number of Visits 24    Date for OT Re-Evaluation 07/30/24    Progress Note Due on Visit 10    OT Start Time 1530   OT Stop Time 1615   OT Time Calculation (min) 45 min    Equipment Utilized During Treatment walker    Activity Tolerance Patient tolerated treatment well    Behavior During Therapy WFL for tasks assessed/performed            Past Medical History:  Diagnosis Date   Anemia    HX of   Arthritis    Bipolar 1 disorder (HCC)    Cancer (HCC)    skin cancer   Cataracts, bilateral    Diabetes mellitus without complication (HCC)    Dyspnea    on exertion (Pt states, out of shape)   Function kidney decreased    High cholesterol    Hypertension    Hypothyroidism    Psoriasis    Psoriatic arthritis (HCC)    Thyroid  disease    Past Surgical History:  Procedure Laterality Date   CATARACT EXTRACTION W/PHACO Left 01/10/2020   Procedure: CATARACT EXTRACTION PHACO AND INTRAOCULAR LENS PLACEMENT (IOC) LEFT DIABETIC;  Surgeon: Ferol Rogue, MD;  Location: [facility];  Service: Ophthalmology;  Laterality: Left;  5.32 0:41.5   CATARACT EXTRACTION W/PHACO Right 02/07/2020   Procedure: CATARACT EXTRACTION PHACO AND INTRAOCULAR LENS PLACEMENT (IOC) RIGHT DIABETIC VISION BLUE 4.01 00:27.4;  Surgeon: Ferol Rogue, MD;  Location: [facility];  Service: Ophthalmology;  Laterality: Right;  Diabetic - oral meds   COLONOSCOPY WITH PROPOFOL  N/A 03/04/2016   Procedure: COLONOSCOPY WITH PROPOFOL ;  Surgeon: Lamar ONEIDA Holmes, MD;  Location: [facility];  Service: Endoscopy;  Laterality: N/A;   great toe amputation     matrixectomy     TONSILLECTOMY     Patient Active  Problem List   Diagnosis Date Noted   Hypothyroidism 03/26/2022   UTI (urinary tract infection) 03/26/2022   Pneumonia due to COVID-19 virus 10/22/2020   Bipolar disorder (HCC) 10/22/2020   Acute encephalopathy 10/22/2020   Benign hypertensive kidney disease with chronic kidney disease 07/11/2019   Secondary hyperparathyroidism of renal origin (HCC) 07/11/2019   Class 1 obesity due to excess calories with serious comorbidity and body mass index (BMI) of 32.0 to 32.9 in adult 05/09/2019   Type 2 diabetes mellitus with stage 3 chronic kidney disease, without long-term current use of insulin  (HCC) 09/16/2017   Stage 3 chronic kidney disease (HCC) 10/04/2016   Essential hypertriglyceridemia 03/22/2016   Essential hypertension 03/17/2016   ONSET DATE: 03/26/22  REFERRING DIAG: M25.511,G89.29 (ICD-10-CM) - Chronic right shoulder pain   THERAPY DIAG:  Muscle weakness (generalized)  Other lack of coordination  Chronic right shoulder pain  Rationale for Evaluation and Treatment: Rehabilitation  SUBJECTIVE:  SUBJECTIVE STATEMENT: Pt. walked in from the front hospital entrance using a walker. Pt accompanied by: self  PERTINENT HISTORY:  Pt returns today to restart therapy for her R shoulder. Pt was seen by this therapist in 2023 post R proximal humerus fx, non-surgical.  Pt attended 4 OT visits in 2023, but then self discharged, though she doesn't recall her reasoning for stopping. Pt did  admit that she was stubborn, and should have kept going, as she remains quite limited with her shoulder ROM. Pt also currently being seen by outpatient PT  to address balance/mobility deficits d/t hx of falling.  Per chart on 03/26/22, 63 y/o old female with PMH significant for bipolar disorder, anemia, cataracts, hyperlipidemia, hypertension, hypothyroidism, obesity, type 2 diabetes, CKD stage IIIb presented in the ED with c/o: generalized  weakness and recurrent falls. Patient has generalized weakness for  some time which has gotten worse.  She had a fall yesterday and was evaluated outpatient and found to have right humeral fracture and was placed in a sling.  Patient had additional fall yesterday, denies any loss of consciousness or head trauma or head injury. In the ED work-up revealed UA positive for UTI.  Lactic acid normal.  x-ray right shoulder shows humeral neck fracture. ED physician spoke with orthopedics to discuss need for urgent evaluation.  Patient was admitted for generalized weakness secondary to UTI,  started on IV antibiotics.  Orthopedics consulted,  recommended conservative management. Patient is not a candidate for surgical intervention.  Advised outpatient follow-up.  Patient completed antibiotics for 3 days. PT recommended  skilled nursing facility for rehab.  Patient feels better and want to be discharged.  Patient being discharged skilled nursing facility for rehab.   PRECAUTIONS: Fall  RED FLAGS: None   WEIGHT BEARING RESTRICTIONS: No  PAIN: 0/10 pain at rest or activity in R shoulder, but does report pain in bilat knees and low back  Are you having pain? Yes: NPRS scale: 6/10  Pain location: low back and bilat knees Pain description: achy Aggravating factors: prolonged standing/walking Relieving factors: rest, Tylenol   FALLS: Has patient fallen in last 6 months? Yes. Number of falls pt unsure, but reports 6 falls, but within more than 6 months   LIVING ENVIRONMENT: Lives with: lives with their spouse Lives in: first floor apt  Stairs: No Has following equipment at home: Counselling psychologist, Environmental consultant - 2 wheeled, Environmental consultant - 4 wheeled, Wheelchair (manual), Shower bench, bed side commode, and Grab bars, bed rail   PLOF:  Independent with basic ADLs, spouse and pt shared IADL responsibilities prior to R shoulder fx in 2023  PATIENT GOALS: I would like to get better movement in my shoulder.   NEXT MD VISIT: Pt unsure, Not for awhile.  OBJECTIVE:  Note: Objective  measures were completed at Evaluation unless otherwise noted.  HAND DOMINANCE: Right  ADLs: Overall ADLs: spouse provides assist as needed.  Pt reports, He's doing a lot now.  Transfers/ambulation related to ADLs: supv-modified indep with AD Eating: indep  Grooming: uses L non-dominant hand to brush hair; able to use R dominant hand to brush teeth with electric toothbrush Upper body dressing: set up-modified indep with donning sports bra and shirt overhead  Lower body dressing: occasional help with clothing set up, extra time and effort to don socks, slip on shoes, indep with panties and pants  Toileting: 3in1 over toilet, uses L hand at baseline for pericare; has tried a toilet wand in the past, but minimally effective  Bathing: spouse helps to wash hair and back, spouse helps to wash buttocks Tub shower transfers: supv-min A for lifting legs over tub shower with pt using transfer tub bench and grab bar  Equipment: see above  UPPER EXTREMITY ROM:     Active ROM Right eval Left Eval WNL throughout  Shoulder flexion 30 (155)   Shoulder abduction 51 (153)  Shoulder adduction    Shoulder extension    Shoulder internal rotation ~thumb to L4-5    Shoulder external rotation 5 (90) arm held in 90 abd by OT   Elbow flexion    Elbow extension    Wrist flexion    Wrist extension    Wrist ulnar deviation    Wrist radial deviation    Wrist pronation    Wrist supination    (Blank rows = not tested)  UPPER EXTREMITY MMT:     MMT Right eval Left eval  Shoulder flexion 2 4+  Shoulder abduction 2 4+  Shoulder adduction    Shoulder extension    Shoulder internal rotation 4- 4+  Shoulder external rotation 2 4-  Middle trapezius    Lower trapezius    Elbow flexion 4 4+  Elbow extension 5 4+  Wrist flexion 5 4+  Wrist extension 5 4+  Wrist ulnar deviation    Wrist radial deviation    Wrist pronation    Wrist supination    (Blank rows = not tested)  HAND FUNCTION: Grip  strength: Right: 37 lbs; Left: 37 lbs, Lateral pinch: Right: 13 lbs, Left: 9 lbs, and 3 point pinch: Right: 9 lbs, Left: 6 lbs  COORDINATION: NT at eval d/t time constraints; will test in upcoming sessions if indicated  05/10/24: 9 hole peg test L 36, R 40  SENSATION:  WFL  EDEMA: No visible edema  COGNITION: Overall cognitive status: History of cognitive impairments - at baseline, decreased memory, hx of bipolar disorder   OBSERVATIONS:  Pt pleasant, cooperative, verbalizes eagerness to improve R shoulder mobility.  TREATMENT DATE: 05/17/24   Therapeutic Exercise:  Seated:  -AROM for scapular elevation, depression, and rotation. -AAROM was performed for right shoulder for flexion, and abduction at the tabletop top surface, then progressed to against gravity using a medium sized wedge.  -Dowel exercises were performed for shoulder flexion, and chest presses for 1 set 5 to 10 reps with support at the RUE. POLLY for shoulder flexion using the UE Ranger (with the base on the floor) through a level plane then the position was modified to promote light against gravity, external rotation was performed.  Neuromuscular re-education:   -facilitated [facility] skills grasping one inch resistive cubes using a 2 pt. Grasp.The board was positioned at a vertical angle. Pt. worked on pressing them back into place while isolating the 2nd digit.                                                                                                                          PATIENT EDUCATION: Education details:  MARCILLE, Person educated: Patient Education method: Explanation Education comprehension: verbalized understanding  HOME EXERCISE PROGRAM:  AAROM  for shoulder flexion, and abduction at the tabletop surface.  GOALS: Goals reviewed with patient? Yes  SHORT TERM GOALS: Target date: 06/18/24  Pt will be indep with HEP for increasing strength and ROM in the R shoulder. Baseline: Eval:  To be initiated in  upcoming sessions Goal status: INITIAL  2.  Pt will identify and implement 2-3 activity modifications/AE in order to compensate for RUE weakness in order to maximize indep with BADLs.  Baseline: Eval: Educ not yet initiated  Goal status: INITIAL  LONG TERM GOALS: Target date: 07/30/24  1.  Pt will increase R active shoulder flexion and abd to 70 degrees or better to better engage the RUE into BADLs. Baseline: Eval: R shoulder flex 30, abd 51 Goal status: INITIAL   2.  Pt will increase active R shoulder ER to 30 degrees or better to enable brushing/combing R side of head with R dominant hand and arm propped as needed. Baseline: Eval: Pt brushes hair with L non-dominant arm; active R shoulder ER 5* with arm propped into abd  Goal status: INITIAL  3.  Pt will increase active R shoulder IR for increased thoroughness with posterior LB bathing.  Baseline: Eval: ~thumb to L4-5; pt reports spouse has to help with posterior in shower  Goal status: INITIAL  3. Pt will increase R shoulder strength by 1/2 muscle grade or more in order to better engage the RUE into BADLs.  Baseline: Eval: R shoulder grossly 2/5  Goal status: INITIAL  ASSESSMENT:  CLINICAL IMPRESSION:  Pt. walked down to therapy from the front entrance of the hospital by herself for the first time using a rolling walker. Pt. continues to present with limited right shoulder AROM, and requires assist, cues, and support when attempting to reach during daily tasks. Pt. was able to progress AAROM shoulder flexion using to a medium incline wedge today. Pt. required support  to increase control when using the UE Ranger. Pt. presented with improved control of the UE Ranger after a few reps. Pt. required cues for visual demonstration of [facility] tasks. Pt. continues to benefit from skilled OT services to maximize safety and independence in necessary daily tasks.     PERFORMANCE DEFICITS: in functional skills including ADLs, IADLs, coordination,  ROM, strength, pain, flexibility, Gross motor control, mobility, balance, body mechanics, decreased knowledge of precautions, decreased knowledge of use of DME, and UE functional use, cognitive skills including memory, and psychosocial skills including coping strategies, environmental adaptation, habits, and routines and behaviors.   IMPAIRMENTS: are limiting patient from ADLs, IADLs, and leisure.   COMORBIDITIES: has co-morbidities such as bipolar disorder, CKD, HTN, DM2, arthritis that affects occupational performance. Patient will benefit from skilled OT to address above impairments and improve overall function.  MODIFICATION OR ASSISTANCE TO COMPLETE EVALUATION: Min-Moderate modification of tasks or assist with assess necessary to complete an evaluation.  OT OCCUPATIONAL PROFILE AND HISTORY: Detailed assessment: Review of records and additional review of physical, cognitive, psychosocial history related to current functional performance.  CLINICAL DECISION MAKING: Moderate - several treatment options, min-mod task modification necessary  REHAB POTENTIAL: Fair based on limited participation last episode of OT in 2023  EVALUATION COMPLEXITY: Moderate    PLAN:  OT FREQUENCY: 2x/week  OT DURATION: 12 weeks  PLANNED INTERVENTIONS: 97168 OT Re-evaluation, 97535 self care/ADL training, 02889 therapeutic exercise, 97530 therapeutic activity, 97140 manual therapy, 97010 moist heat, 97010 cryotherapy, 97750 Physical Performance Testing, passive range of motion, psychosocial skills training, energy conservation, coping strategies training, patient/family education, and DME and/or AE instructions  RECOMMENDED OTHER SERVICES: None at this time (Pt currently seeing PT for mobility deficits)  CONSULTED AND AGREED WITH PLAN OF CARE: Patient  PLAN FOR NEXT SESSION: see above  Richardson Otter, MS, OTR/L  05/17/2024, 4:29 PM

## 2024-05-17 NOTE — Therapy (Signed)
 OUTPATIENT PHYSICAL THERAPY TREATMENT   Patient Name: Stacey Ball MRN: [id number] DOB:12-20-1960, 63 y.o., female Today's Date: 05/17/2024  PCP: Alla Amis MD REFERRING PROVIDER: Jonette, Minor  END OF SESSION:   PT End of Session - 05/17/24 1451     Visit Number 7    Number of Visits 24    Date for PT Re-Evaluation 07/19/24    Progress Note Due on Visit 10    PT Start Time 1445    PT Stop Time 1525    PT Time Calculation (min) 40 min    Equipment Utilized During Treatment Gait belt    Activity Tolerance Patient tolerated treatment well;Patient limited by fatigue    Behavior During Therapy WFL for tasks assessed/performed           Past Medical History:  Diagnosis Date   Anemia    HX of   Arthritis    Bipolar 1 disorder (HCC)    Cancer (HCC)    skin cancer   Cataracts, bilateral    Diabetes mellitus without complication (HCC)    Dyspnea    on exertion (Pt states, out of shape)   Function kidney decreased    High cholesterol    Hypertension    Hypothyroidism    Psoriasis    Psoriatic arthritis (HCC)    Thyroid  disease    Past Surgical History:  Procedure Laterality Date   CATARACT EXTRACTION W/PHACO Left 01/10/2020   Procedure: CATARACT EXTRACTION PHACO AND INTRAOCULAR LENS PLACEMENT (IOC) LEFT DIABETIC;  Surgeon: Ferol Rogue, MD;  Location: [facility];  Service: Ophthalmology;  Laterality: Left;  5.32 0:41.5   CATARACT EXTRACTION W/PHACO Right 02/07/2020   Procedure: CATARACT EXTRACTION PHACO AND INTRAOCULAR LENS PLACEMENT (IOC) RIGHT DIABETIC VISION BLUE 4.01 00:27.4;  Surgeon: Ferol Rogue, MD;  Location: [facility];  Service: Ophthalmology;  Laterality: Right;  Diabetic - oral meds   COLONOSCOPY WITH PROPOFOL  N/A 03/04/2016   Procedure: COLONOSCOPY WITH PROPOFOL ;  Surgeon: Lamar ONEIDA Holmes, MD;  Location: [facility];  Service: Endoscopy;  Laterality: N/A;   great toe amputation     matrixectomy     TONSILLECTOMY      Patient Active Problem List   Diagnosis Date Noted   Hypothyroidism 03/26/2022   UTI (urinary tract infection) 03/26/2022   Pneumonia due to COVID-19 virus 10/22/2020   Bipolar disorder (HCC) 10/22/2020   Acute encephalopathy 10/22/2020   Benign hypertensive kidney disease with chronic kidney disease 07/11/2019   Secondary hyperparathyroidism of renal origin (HCC) 07/11/2019   Class 1 obesity due to excess calories with serious comorbidity and body mass index (BMI) of 32.0 to 32.9 in adult 05/09/2019   Type 2 diabetes mellitus with stage 3 chronic kidney disease, without long-term current use of insulin  (HCC) 09/16/2017   Stage 3 chronic kidney disease (HCC) 10/04/2016   Essential hypertriglyceridemia 03/22/2016   Essential hypertension 03/17/2016   ONSET DATE: 2 years ago  REFERRING DIAG: Chronic right shoulder pain [M25.511, G89.29], Leg weakness, bilateral [R29.898]   THERAPY DIAG:  Muscle weakness (generalized)  Other lack of coordination  Difficulty in walking, not elsewhere classified  Other abnormalities of gait and mobility  Unsteadiness on feet  Rationale for Evaluation and Treatment: Rehabilitation  SUBJECTIVE:  SUBJECTIVE STATEMENT: I walked down here today for the first time.  Actually, my knees aren't really bothering me today, just my back. It's my back.   PERTINENT HISTORY:  Pt presents to clinic today in transport chair. Pt has had consistent falls for the last 2 years, but she reports not having fallen in the last 6 months. Pt uses a 4WW at home c the brakes constantly on to avoid the rollator rolling outside her BOS. Pt stated she does not like 2WW as she fell over one when attempting to rush to the toilet one night. Pt states she has dizzy spells, particularly when  coming from a sit to stand or with head turns during gait. Pt also stated her dizziness has tapered off recently as well. She reports all these started around when she was hospitalized for covid and PNA 2 years ago. Pt has a bed rail in her bed. Installed it after falling out of bed multiple times. Pt has very limited shoulder ROM. She reports her husband helps around a lot at home with ADL and IADL like washing her hair. However, she feels his help is prohibiting her from getting better and requested us  to explain to him that she needs to attempt more tasks on her own.  PAIN:  Are you having pain? No pain while sitting, exercising, just with walking.   PRECAUTIONS: Fall  WEIGHT BEARING RESTRICTIONS: No  FALLS: Has patient fallen in last 6 months? No  LIVING ENVIRONMENT: Lives with: lives with their spouse Lives in: House/apartment Stairs: No Has following equipment at home: Single point cane, Environmental consultant - 2 wheeled, and Environmental consultant - 4 wheeled  PLOF: Independent  PATIENT GOALS: Would like to walk without AD, Work up strength in L and R arm.   OBJECTIVE:  Note: Objective measures were completed at evaluation unless otherwise noted.                                                                                                                             TREATMENT DATE 05/17/24 :  -AMB to rehab gym c RW  -lateral stepping in // bars 4x bilat, then alternate forward/backward in // bars 4x each  -Sit break  -STS from chair x10  -Sit break  - with 2lb AW bilat lateral stepping in // bars 4x bilat, then alternate forward/backward in // bars 4x each  -Sit break  -STS from chair x10  -AMB overground 145ft in 2:59, RW (intermittent festination and normal walking alternating)  -standing marching x16, alternatingf (as perfomred at home)  -standing heel raise x10 with support (new HEP addiiton today)  -standing marching x24, alternating -standing heel raise x10 with support   PATIENT  EDUCATION:  Education details: POC  Person educated: Patient  Education method: Explanation  Education comprehension: verbalized understanding  HOME EXERCISE PROGRAM: Access Code: [email or web address] URL: [email or web address] Date: 05/17/2024 Prepared by: Peggye Linear  Exercises - Sit to Stand with Counter Support  -  1 x daily - 2 sets - 10 reps - Standing March with Counter Support  - 1 x daily - 3 sets - 12 reps - Seated Scapular Retraction  - 1 x daily - 3 sets - 12 reps - Heel Raises with Counter Support  - 1 x daily - 3 sets - 10 reps   GOALS: Goals reviewed with patient? Yes  SHORT TERM GOALS: Target date: 07/19/2024 Consistently perform HEP at least 4x/week to maintain progress made in PT to improve function and overall QoL. Baseline: Initiated on 04/30/2024 Goal status: INITIAL  LONG TERM GOALS: Target date: 07/19/2024 Pt will decrease her STS time by 10 seconds to improve power and strength which will increase QoL.  Baseline: 33.69 sec B UE support Goal status: INITIAL  2.  Pt will decrease her TUG time by 10 seconds to reduce risk of falls and increase QoL.  Baseline: 42.64 sec with rollator Goal status: INITIAL  3.  Pt will decrease time by 0.25 m/s in order to increase gait speed to reduce fall risk. Baseline: 04/30/2024: 0.35 m/s using her personal rollator with brakes locked and close SBA/CGA for safety Goal status: INITIAL  4.  Pt will increase distance walked during to aid with community ambulation and reduce fall risk. Baseline: 04/30/2024: 103 feet, had to stop at 27min40seconds due to fatigue, (31.39 meters, Avg speed 0.68m/s) using her personal rollator with brakes locked and CGA for safety Goal status: INITIAL  5.  Pt will increase ABC score to 50% to increase her confidence with daily activities and improve overall QoL. Baseline: 30% Goal status: INITIAL  ASSESSMENT:  CLINICAL IMPRESSION:  Pt continues to make strides toward LT goals.  Pt was able to AMB to clinic this date for first time instaed of WC transport from volunteers. AMB remains globally very slow, but pt has elevated confidence and appears to have minimal LOB. Updated pt's sets/reps on HEP to reflect improvements in activity tolerance. The pt will benefit from further skilled PT to improve these deficits in order to increase QOL, decrease fall risk, and increase safety and independence with ADLs and functional mobility.   OBJECTIVE IMPAIRMENTS: Abnormal gait, decreased activity tolerance, decreased balance, decreased coordination, decreased knowledge of use of DME, decreased mobility, difficulty walking, decreased ROM, decreased strength, dizziness, and improper body mechanics.   ACTIVITY LIMITATIONS: carrying, lifting, bending, standing, squatting, transfers, bed mobility, bathing, and locomotion level  PARTICIPATION LIMITATIONS: meal prep, cleaning, laundry, medication management, driving, shopping, community activity, and yard work  PERSONAL FACTORS: Past/current experiences, Time since onset of injury/illness/exacerbation, and 1-2 comorbidities:   are also affecting patient's functional outcome.   REHAB POTENTIAL: Good  CLINICAL DECISION MAKING: Evolving/moderate complexity  EVALUATION COMPLEXITY: Moderate  PLAN:  PT FREQUENCY: 2x/week  PT DURATION: 12 weeks  PLANNED INTERVENTIONS: 97110-Therapeutic exercises, 97530- Therapeutic activity, [id number]- Neuromuscular re-education, 97535- Self Care, 02859- Manual therapy, [phone]- Gait training, Patient/Family education, Joint mobilization, and DME instructions  PLAN FOR NEXT SESSION:  Contiue to advance toward LT goals. Pt still strugglign to improve gait speed, despite toelrable distance increases.    2:54 PM, 05/17/24 Peggye JAYSON Linear, PT, DPT Physical Therapist - [facility]  Outpatient Physical Therapy- [facility] [phone]

## 2024-05-21 ENCOUNTER — Ambulatory Visit

## 2024-05-21 ENCOUNTER — Ambulatory Visit: Attending: Family Medicine

## 2024-05-21 DIAGNOSIS — M25611 Stiffness of right shoulder, not elsewhere classified: Secondary | ICD-10-CM

## 2024-05-21 DIAGNOSIS — M6281 Muscle weakness (generalized): Secondary | ICD-10-CM

## 2024-05-21 DIAGNOSIS — R262 Difficulty in walking, not elsewhere classified: Secondary | ICD-10-CM | POA: Insufficient documentation

## 2024-05-21 DIAGNOSIS — R278 Other lack of coordination: Secondary | ICD-10-CM | POA: Diagnosis not present

## 2024-05-21 DIAGNOSIS — R2689 Other abnormalities of gait and mobility: Secondary | ICD-10-CM | POA: Diagnosis not present

## 2024-05-21 DIAGNOSIS — M25511 Pain in right shoulder: Secondary | ICD-10-CM | POA: Diagnosis not present

## 2024-05-21 DIAGNOSIS — G8929 Other chronic pain: Secondary | ICD-10-CM | POA: Insufficient documentation

## 2024-05-21 DIAGNOSIS — R2681 Unsteadiness on feet: Secondary | ICD-10-CM | POA: Diagnosis not present

## 2024-05-21 NOTE — Therapy (Unsigned)
 OUTPATIENT OCCUPATIONAL THERAPY ORTHO TREATMENT  Patient Name: Stacey Ball MRN: [id number] DOB:04-02-1961, 63 y.o., female Today's Date: 05/07/2024  PCP: Dr. Alda Helling REFERRING PROVIDER: Dr. Alda Helling  END OF SESSION:   OT End of Session - 05/22/24 1216     Visit Number 5    Number of Visits 24    Date for OT Re-Evaluation 07/30/24    Progress Note Due on Visit 10    OT Start Time 1315    OT Stop Time 1400    OT Time Calculation (min) 45 min    Equipment Utilized During Treatment walker    Activity Tolerance Patient tolerated treatment well    Behavior During Therapy WFL for tasks assessed/performed          Past Medical History:  Diagnosis Date   Anemia    HX of   Arthritis    Bipolar 1 disorder (HCC)    Cancer (HCC)    skin cancer   Cataracts, bilateral    Diabetes mellitus without complication (HCC)    Dyspnea    on exertion (Pt states, out of shape)   Function kidney decreased    High cholesterol    Hypertension    Hypothyroidism    Psoriasis    Psoriatic arthritis (HCC)    Thyroid  disease    Past Surgical History:  Procedure Laterality Date   CATARACT EXTRACTION W/PHACO Left 01/10/2020   Procedure: CATARACT EXTRACTION PHACO AND INTRAOCULAR LENS PLACEMENT (IOC) LEFT DIABETIC;  Surgeon: Ferol Rogue, MD;  Location: [facility];  Service: Ophthalmology;  Laterality: Left;  5.32 0:41.5   CATARACT EXTRACTION W/PHACO Right 02/07/2020   Procedure: CATARACT EXTRACTION PHACO AND INTRAOCULAR LENS PLACEMENT (IOC) RIGHT DIABETIC VISION BLUE 4.01 00:27.4;  Surgeon: Ferol Rogue, MD;  Location: [facility];  Service: Ophthalmology;  Laterality: Right;  Diabetic - oral meds   COLONOSCOPY WITH PROPOFOL  N/A 03/04/2016   Procedure: COLONOSCOPY WITH PROPOFOL ;  Surgeon: Lamar ONEIDA Holmes, MD;  Location: [facility];  Service: Endoscopy;  Laterality: N/A;   great toe amputation     matrixectomy     TONSILLECTOMY     Patient Active  Problem List   Diagnosis Date Noted   Hypothyroidism 03/26/2022   UTI (urinary tract infection) 03/26/2022   Pneumonia due to COVID-19 virus 10/22/2020   Bipolar disorder (HCC) 10/22/2020   Acute encephalopathy 10/22/2020   Benign hypertensive kidney disease with chronic kidney disease 07/11/2019   Secondary hyperparathyroidism of renal origin (HCC) 07/11/2019   Class 1 obesity due to excess calories with serious comorbidity and body mass index (BMI) of 32.0 to 32.9 in adult 05/09/2019   Type 2 diabetes mellitus with stage 3 chronic kidney disease, without long-term current use of insulin  (HCC) 09/16/2017   Stage 3 chronic kidney disease (HCC) 10/04/2016   Essential hypertriglyceridemia 03/22/2016   Essential hypertension 03/17/2016   ONSET DATE: 03/26/22  REFERRING DIAG: M25.511,G89.29 (ICD-10-CM) - Chronic right shoulder pain   THERAPY DIAG:  Muscle weakness (generalized)  Other lack of coordination  Chronic right shoulder pain  Rationale for Evaluation and Treatment: Rehabilitation  SUBJECTIVE:  SUBJECTIVE STATEMENT: Pt acknowledged using her walker more and not wanting to use the transport chair to reach the gym for therapy.  Pt accompanied by: self  PERTINENT HISTORY:  Pt returns today to restart therapy for her R shoulder. Pt was seen by this therapist in 2023 post R proximal humerus fx, non-surgical.  Pt attended 4 OT visits in 2023, but then self  discharged, though she doesn't recall her reasoning for stopping. Pt did admit that she was stubborn, and should have kept going, as she remains quite limited with her shoulder ROM. Pt also currently being seen by outpatient PT  to address balance/mobility deficits d/t hx of falling.  Per chart on 03/26/22, 63 y/o old female with PMH significant for bipolar disorder, anemia, cataracts, hyperlipidemia, hypertension, hypothyroidism, obesity, type 2 diabetes, CKD stage IIIb presented in the ED with c/o: generalized  weakness and  recurrent falls. Patient has generalized weakness for some time which has gotten worse.  She had a fall yesterday and was evaluated outpatient and found to have right humeral fracture and was placed in a sling.  Patient had additional fall yesterday, denies any loss of consciousness or head trauma or head injury. In the ED work-up revealed UA positive for UTI.  Lactic acid normal.  x-ray right shoulder shows humeral neck fracture. ED physician spoke with orthopedics to discuss need for urgent evaluation.  Patient was admitted for generalized weakness secondary to UTI,  started on IV antibiotics.  Orthopedics consulted,  recommended conservative management. Patient is not a candidate for surgical intervention.  Advised outpatient follow-up.  Patient completed antibiotics for 3 days. PT recommended  skilled nursing facility for rehab.  Patient feels better and want to be discharged.  Patient being discharged skilled nursing facility for rehab.   PRECAUTIONS: Fall  RED FLAGS: None   WEIGHT BEARING RESTRICTIONS: No  PAIN: 0/10 pain at rest or activity in R shoulder, but does report pain in bilat knees and low back  Are you having pain? Yes: NPRS scale: 6/10  Pain location: low back and bilat knees Pain description: achy Aggravating factors: prolonged standing/walking Relieving factors: rest, Tylenol   FALLS: Has patient fallen in last 6 months? Yes. Number of falls pt unsure, but reports 6 falls, but within more than 6 months   LIVING ENVIRONMENT: Lives with: lives with their spouse Lives in: first floor apt  Stairs: No Has following equipment at home: Counselling psychologist, Environmental consultant - 2 wheeled, Environmental consultant - 4 wheeled, Wheelchair (manual), Shower bench, bed side commode, and Grab bars, bed rail   PLOF:  Independent with basic ADLs, spouse and pt shared IADL responsibilities prior to R shoulder fx in 2023  PATIENT GOALS: I would like to get better movement in my shoulder.   NEXT MD VISIT: Pt  unsure, Not for awhile.  OBJECTIVE:  Note: Objective measures were completed at Evaluation unless otherwise noted.  HAND DOMINANCE: Right  ADLs: Overall ADLs: spouse provides assist as needed.  Pt reports, He's doing a lot now.  Transfers/ambulation related to ADLs: supv-modified indep with AD Eating: indep  Grooming: uses L non-dominant hand to brush hair; able to use R dominant hand to brush teeth with electric toothbrush Upper body dressing: set up-modified indep with donning sports bra and shirt overhead  Lower body dressing: occasional help with clothing set up, extra time and effort to don socks, slip on shoes, indep with panties and pants  Toileting: 3in1 over toilet, uses L hand at baseline for pericare; has tried a toilet wand in the past, but minimally effective  Bathing: spouse helps to wash hair and back, spouse helps to wash buttocks Tub shower transfers: supv-min A for lifting legs over tub shower with pt using transfer tub bench and grab bar  Equipment: see above  UPPER EXTREMITY ROM:     Active ROM Right eval Left Eval WNL throughout  Shoulder  flexion 30 (155)   Shoulder abduction 51 (153)   Shoulder adduction    Shoulder extension    Shoulder internal rotation ~thumb to L4-5    Shoulder external rotation 5 (90) arm held in 90 abd by OT   Elbow flexion    Elbow extension    Wrist flexion    Wrist extension    Wrist ulnar deviation    Wrist radial deviation    Wrist pronation    Wrist supination    (Blank rows = not tested)  UPPER EXTREMITY MMT:     MMT Right eval Left eval  Shoulder flexion 2 4+  Shoulder abduction 2 4+  Shoulder adduction    Shoulder extension    Shoulder internal rotation 4- 4+  Shoulder external rotation 2 4-  Middle trapezius    Lower trapezius    Elbow flexion 4 4+  Elbow extension 5 4+  Wrist flexion 5 4+  Wrist extension 5 4+  Wrist ulnar deviation    Wrist radial deviation    Wrist pronation    Wrist supination     (Blank rows = not tested)  HAND FUNCTION: Grip strength: Right: 37 lbs; Left: 37 lbs, Lateral pinch: Right: 13 lbs, Left: 9 lbs, and 3 point pinch: Right: 9 lbs, Left: 6 lbs  COORDINATION: NT at eval d/t time constraints; will test in upcoming sessions if indicated  05/10/24: 9 hole peg test L 36, R 40  SENSATION:  WFL  EDEMA: No visible edema  COGNITION: Overall cognitive status: History of cognitive impairments - at baseline, decreased memory, hx of bipolar disorder   OBSERVATIONS:  Pt pleasant, cooperative, verbalizes eagerness to improve R shoulder mobility.  TREATMENT DATE: 05/21/24   Therapeutic Exercise: -AROM for scapular elevation, depression, and rotation x10 reps each; min vc and demo to promote optimal form/technique -Dowel climb on table top to promote R shoulder flexion x 5 reps; OT provided min A to support RUE during climb and descent, and min A to promote R shoulder depression during shoulder flexion. -RUE strengthening with yellow theraband with OT providing active assist to RUE for stability and to reduce compensation: bilat shoulder horiz abd/add, R shoulder flex/abd, ER/IR, abd, elbow flexion, R chest press and seated row x2 sets 10 reps each -Scifit level 1.5 resistance for 3.5 min forward rotation, 1.5 min reverse rotation; intermittent tactile and vc to depress/relax R shoulder to minimize hiking                                                                                                       PATIENT EDUCATION: Education details:  RUE strengthening  Person educated: Patient Education method: Explanation Education comprehension: verbalized understanding  HOME EXERCISE PROGRAM:  AAROM  for shoulder flexion, and abduction at the tabletop surface.  GOALS: Goals reviewed with patient? Yes  SHORT TERM GOALS: Target date: 06/18/24  Pt will be indep with HEP for increasing strength and ROM in the R shoulder. Baseline: Eval: To be initiated in upcoming  sessions Goal status: INITIAL  2.  Pt will identify and  implement 2-3 activity modifications/AE in order to compensate for RUE weakness in order to maximize indep with BADLs.  Baseline: Eval: Educ not yet initiated  Goal status: INITIAL  LONG TERM GOALS: Target date: 07/30/24  1.  Pt will increase R active shoulder flexion and abd to 70 degrees or better to better engage the RUE into BADLs. Baseline: Eval: R shoulder flex 30, abd 51 Goal status: INITIAL   2.  Pt will increase active R shoulder ER to 30 degrees or better to enable brushing/combing R side of head with R dominant hand and arm propped as needed. Baseline: Eval: Pt brushes hair with L non-dominant arm; active R shoulder ER 5* with arm propped into abd  Goal status: INITIAL  3.  Pt will increase active R shoulder IR for increased thoroughness with posterior LB bathing.  Baseline: Eval: ~thumb to L4-5; pt reports spouse has to help with posterior in shower  Goal status: INITIAL  3. Pt will increase R shoulder strength by 1/2 muscle grade or more in order to better engage the RUE into BADLs.  Baseline: Eval: R shoulder grossly 2/5  Goal status: INITIAL  ASSESSMENT: CLINICAL IMPRESSION: Pt with good tolerance to RUE therapeutic exercises this date.  Noted crepitus near end range with R shoulder flexion and abd, and pt was cued to limit end range to limit this grinding, but pt denied pain throughout.  Pt required constant support from OT for active assisted ROM to maximize form and end range and to minimize shoulder hiking while completing theraband and dowel climbs.  By end of session, pt began to increase awareness to R shoulder hiking and did begin to initiate some slight shoulder depression during flexion and abd, but these ranges are extremely limited actively and always with a significant amount of hiking despite attempts at shoulder depression.  Assume significant rotator cuff involvement based on pt's limited active  range/movement quality, and shoulder hiking.  OT did provide education to pt on realistic goals for RUE mobility.  Pt receptive and acknowledged understanding and remains motivated to work towards regaining any functional range possible to increase use of the RUE for daily tasks.  Pt. continues to benefit from skilled OT services to maximize safety and independence in necessary daily tasks.     PERFORMANCE DEFICITS: in functional skills including ADLs, IADLs, coordination, ROM, strength, pain, flexibility, Gross motor control, mobility, balance, body mechanics, decreased knowledge of precautions, decreased knowledge of use of DME, and UE functional use, cognitive skills including memory, and psychosocial skills including coping strategies, environmental adaptation, habits, and routines and behaviors.   IMPAIRMENTS: are limiting patient from ADLs, IADLs, and leisure.   COMORBIDITIES: has co-morbidities such as bipolar disorder, CKD, HTN, DM2, arthritis that affects occupational performance. Patient will benefit from skilled OT to address above impairments and improve overall function.  MODIFICATION OR ASSISTANCE TO COMPLETE EVALUATION: Min-Moderate modification of tasks or assist with assess necessary to complete an evaluation.  OT OCCUPATIONAL PROFILE AND HISTORY: Detailed assessment: Review of records and additional review of physical, cognitive, psychosocial history related to current functional performance.  CLINICAL DECISION MAKING: Moderate - several treatment options, min-mod task modification necessary  REHAB POTENTIAL: Fair based on limited participation last episode of OT in 2023  EVALUATION COMPLEXITY: Moderate   PLAN:  OT FREQUENCY: 2x/week  OT DURATION: 12 weeks  PLANNED INTERVENTIONS: 97168 OT Re-evaluation, 97535 self care/ADL training, 02889 therapeutic exercise, 97530 therapeutic activity, 97140 manual therapy, 97010 moist heat, 97010 cryotherapy, 97750 Physical Performance  Testing, passive range of motion, psychosocial skills training, energy conservation, coping strategies training, patient/family education, and DME and/or AE instructions  RECOMMENDED OTHER SERVICES: None at this time (Pt currently seeing PT for mobility deficits)  CONSULTED AND AGREED WITH PLAN OF CARE: Patient  PLAN FOR NEXT SESSION: see above  Inocente Blazing, MS, OTR/L   05/22/2024, 12:18 PM

## 2024-05-21 NOTE — Therapy (Signed)
 OUTPATIENT PHYSICAL THERAPY TREATMENT   Patient Name: Stacey Ball MRN: [id number] DOB:[birth year], 63 y.o., female Today's Date: 05/21/2024  PCP: Alla Amis MD REFERRING PROVIDER: Jonette, Minor  END OF SESSION:   PT End of Session - 05/21/24 1351     Visit Number 8    Number of Visits 24    Date for PT Re-Evaluation 07/19/24    Progress Note Due on Visit 10    PT Start Time 1401    PT Stop Time 1445    PT Time Calculation (min) 44 min    Equipment Utilized During Treatment Gait belt    Activity Tolerance Patient tolerated treatment well;Patient limited by fatigue    Behavior During Therapy WFL for tasks assessed/performed           Past Medical History:  Diagnosis Date   Anemia    HX of   Arthritis    Bipolar 1 disorder (HCC)    Cancer (HCC)    skin cancer   Cataracts, bilateral    Diabetes mellitus without complication (HCC)    Dyspnea    on exertion (Pt states, out of shape)   Function kidney decreased    High cholesterol    Hypertension    Hypothyroidism    Psoriasis    Psoriatic arthritis (HCC)    Thyroid  disease    Past Surgical History:  Procedure Laterality Date   CATARACT EXTRACTION W/PHACO Left 01/10/2020   Procedure: CATARACT EXTRACTION PHACO AND INTRAOCULAR LENS PLACEMENT (IOC) LEFT DIABETIC;  Surgeon: Ferol Rogue, MD;  Location: [facility];  Service: Ophthalmology;  Laterality: Left;  5.32 0:41.5   CATARACT EXTRACTION W/PHACO Right 02/07/2020   Procedure: CATARACT EXTRACTION PHACO AND INTRAOCULAR LENS PLACEMENT (IOC) RIGHT DIABETIC VISION BLUE 4.01 00:27.4;  Surgeon: Ferol Rogue, MD;  Location: [facility];  Service: Ophthalmology;  Laterality: Right;  Diabetic - oral meds   COLONOSCOPY WITH PROPOFOL  N/A 03/04/2016   Procedure: COLONOSCOPY WITH PROPOFOL ;  Surgeon: Lamar ONEIDA Holmes, MD;  Location: [facility];  Service: Endoscopy;  Laterality: N/A;   great toe amputation     matrixectomy     TONSILLECTOMY      Patient Active Problem List   Diagnosis Date Noted   Hypothyroidism 03/26/2022   UTI (urinary tract infection) 03/26/2022   Pneumonia due to COVID-19 virus 10/22/2020   Bipolar disorder (HCC) 10/22/2020   Acute encephalopathy 10/22/2020   Benign hypertensive kidney disease with chronic kidney disease 07/11/2019   Secondary hyperparathyroidism of renal origin (HCC) 07/11/2019   Class 1 obesity due to excess calories with serious comorbidity and body mass index (BMI) of 32.0 to 32.9 in adult 05/09/2019   Type 2 diabetes mellitus with stage 3 chronic kidney disease, without long-term current use of insulin  (HCC) 09/16/2017   Stage 3 chronic kidney disease (HCC) 10/04/2016   Essential hypertriglyceridemia 03/22/2016   Essential hypertension 03/17/2016   ONSET DATE: 2 years ago  REFERRING DIAG: Chronic right shoulder pain [M25.511, G89.29], Leg weakness, bilateral [R29.898]   THERAPY DIAG:  Muscle weakness (generalized)  Other lack of coordination  Difficulty in walking, not elsewhere classified  Other abnormalities of gait and mobility  Unsteadiness on feet  Right shoulder pain, unspecified chronicity  Chronic right shoulder pain  Stiffness of right shoulder, not elsewhere classified  Rationale for Evaluation and Treatment: Rehabilitation  SUBJECTIVE:  SUBJECTIVE STATEMENT: Pt denies falls. Still walking from valet parking into clinic. Denies pain.   PERTINENT HISTORY:  Pt presents to clinic today in transport chair. Pt has had consistent falls for the last 2 years, but she reports not having fallen in the last 6 months. Pt uses a 4WW at home c the brakes constantly on to avoid the rollator rolling outside her BOS. Pt stated she does not like 2WW as she fell over one when attempting to  rush to the toilet one night. Pt states she has dizzy spells, particularly when coming from a sit to stand or with head turns during gait. Pt also stated her dizziness has tapered off recently as well. She reports all these started around when she was hospitalized for covid and PNA 2 years ago. Pt has a bed rail in her bed. Installed it after falling out of bed multiple times. Pt has very limited shoulder ROM. She reports her husband helps around a lot at home with ADL and IADL like washing her hair. However, she feels his help is prohibiting her from getting better and requested us  to explain to him that she needs to attempt more tasks on her own.  PAIN:  Are you having pain? No pain while sitting, exercising, just with walking.   PRECAUTIONS: Fall  WEIGHT BEARING RESTRICTIONS: No  FALLS: Has patient fallen in last 6 months? No  LIVING ENVIRONMENT: Lives with: lives with their spouse Lives in: House/apartment Stairs: No Has following equipment at home: Single point cane, Environmental consultant - 2 wheeled, and Environmental consultant - 4 wheeled  PLOF: Independent  PATIENT GOALS: Would like to walk without AD, Work up strength in L and R arm.   OBJECTIVE:  Note: Objective measures were completed at evaluation unless otherwise noted.                                                                                                                             TREATMENT DATE 05/21/24:   Neuro Re-Ed:   STS 1x10, 1x8 with seated rest b/t bouts. Reliant on BUE support on arm rests.     Resisted gait with 2# AW's using RW. SBA. 2x150'. Seated rest b/t bouts. Foot clearance and step lengths diminishes with fatigue.    Side stepping with 2# AW's donned in // bars: 4x down and back     Forward step overs in // bars with BUE support: 1/2 bolster --> hurdle --> hurdle --> 1/2 bolster. 4 laps down and back SBA. Min VC's for R hip flexion versus circumduction with good carryover.     Gait 150' using RW and no AW's. Pt appears  with LE fatigue with diminished step lengths with step through pattern and diminished foot clearance. Corrects with VC's. SBA.   PATIENT EDUCATION:  Education details: POC  Person educated: Patient  Education method: Explanation  Education comprehension: verbalized understanding  HOME EXERCISE PROGRAM: Access Code: [email or web address] URL: [email or web address] Date: 05/17/2024 Prepared by:  Peggye Linear  Exercises - Sit to Stand with Counter Support  - 1 x daily - 2 sets - 10 reps - Standing March with Counter Support  - 1 x daily - 3 sets - 12 reps - Seated Scapular Retraction  - 1 x daily - 3 sets - 12 reps - Heel Raises with Counter Support  - 1 x daily - 3 sets - 10 reps   GOALS: Goals reviewed with patient? Yes  SHORT TERM GOALS: Target date: 07/19/2024 Consistently perform HEP at least 4x/week to maintain progress made in PT to improve function and overall QoL. Baseline: Initiated on 04/30/2024 Goal status: INITIAL  LONG TERM GOALS: Target date: 07/19/2024 Pt will decrease her STS time by 10 seconds to improve power and strength which will increase QoL.  Baseline: 33.69 sec B UE support Goal status: INITIAL  2.  Pt will decrease her TUG time by 10 seconds to reduce risk of falls and increase QoL.  Baseline: 42.64 sec with rollator Goal status: INITIAL  3.  Pt will decrease time by 0.25 m/s in order to increase gait speed to reduce fall risk. Baseline: 04/30/2024: 0.35 m/s using her personal rollator with brakes locked and close SBA/CGA for safety Goal status: INITIAL  4.  Pt will increase distance walked during to aid with community ambulation and reduce fall risk. Baseline: 04/30/2024: 103 feet, had to stop at 47min40seconds due to fatigue, (31.39 meters, Avg speed 0.84m/s) using her personal rollator with brakes locked and CGA for safety Goal status: INITIAL  5.  Pt will increase ABC score to 50% to increase her confidence with daily activities and  improve overall QoL. Baseline: 30% Goal status: INITIAL  ASSESSMENT:  CLINICAL IMPRESSION:  Continuing PT POC working on global LE strengthening and gait mechanics. Working some on power production and endurance with standing/walking tasks needing regular seated rest breaks due to weakness and/or knee pain. Gait speed remains diminished but overall quality of step lengths and height is vastly improved. Gait quality does worsen by end of session likely due to fatigue in LE's. The pt will benefit from further skilled PT to improve these deficits in order to increase QOL, decrease fall risk, and increase safety and independence with ADLs and functional mobility.   OBJECTIVE IMPAIRMENTS: Abnormal gait, decreased activity tolerance, decreased balance, decreased coordination, decreased knowledge of use of DME, decreased mobility, difficulty walking, decreased ROM, decreased strength, dizziness, and improper body mechanics.   ACTIVITY LIMITATIONS: carrying, lifting, bending, standing, squatting, transfers, bed mobility, bathing, and locomotion level  PARTICIPATION LIMITATIONS: meal prep, cleaning, laundry, medication management, driving, shopping, community activity, and yard work  PERSONAL FACTORS: Past/current experiences, Time since onset of injury/illness/exacerbation, and 1-2 comorbidities:   are also affecting patient's functional outcome.   REHAB POTENTIAL: Good  CLINICAL DECISION MAKING: Evolving/moderate complexity  EVALUATION COMPLEXITY: Moderate  PLAN:  PT FREQUENCY: 2x/week  PT DURATION: 12 weeks  PLANNED INTERVENTIONS: 97110-Therapeutic exercises, 97530- Therapeutic activity, [id number]- Neuromuscular re-education, 97535- Self Care, 02859- Manual therapy, [phone]- Gait training, Patient/Family education, Joint mobilization, and DME instructions  PLAN FOR NEXT SESSION:  Contiue to advance toward LT goals. Pt still strugglign to improve gait speed, despite toelrable distance increases.    Dorina HERO. Fairly IV, PT, DPT Physical Therapist- [place]  [facility] 3:39 PM, 05/21/24

## 2024-05-23 ENCOUNTER — Ambulatory Visit: Admitting: Occupational Therapy

## 2024-05-23 ENCOUNTER — Ambulatory Visit: Admitting: Physical Therapy

## 2024-05-23 DIAGNOSIS — R262 Difficulty in walking, not elsewhere classified: Secondary | ICD-10-CM

## 2024-05-23 DIAGNOSIS — M6281 Muscle weakness (generalized): Secondary | ICD-10-CM

## 2024-05-23 DIAGNOSIS — R2689 Other abnormalities of gait and mobility: Secondary | ICD-10-CM

## 2024-05-23 DIAGNOSIS — R278 Other lack of coordination: Secondary | ICD-10-CM

## 2024-05-23 DIAGNOSIS — R2681 Unsteadiness on feet: Secondary | ICD-10-CM

## 2024-05-23 NOTE — Therapy (Signed)
 OUTPATIENT PHYSICAL THERAPY TREATMENT   Patient Name: Stacey Ball MRN: [id number] DOB:August 10, 1961, 63 y.o., female Today's Date: 05/23/2024  PCP: Alla Amis MD REFERRING PROVIDER: Jonette, Minor  END OF SESSION:   PT End of Session - 05/23/24 1320     Visit Number 9    Number of Visits 24    Date for PT Re-Evaluation 07/19/24    Progress Note Due on Visit 10    PT Start Time 1320    PT Stop Time 1400    PT Time Calculation (min) 40 min    Equipment Utilized During Treatment Gait belt    Activity Tolerance Patient tolerated treatment well;Patient limited by fatigue    Behavior During Therapy WFL for tasks assessed/performed            Past Medical History:  Diagnosis Date   Anemia    HX of   Arthritis    Bipolar 1 disorder (HCC)    Cancer (HCC)    skin cancer   Cataracts, bilateral    Diabetes mellitus without complication (HCC)    Dyspnea    on exertion (Pt states, out of shape)   Function kidney decreased    High cholesterol    Hypertension    Hypothyroidism    Psoriasis    Psoriatic arthritis (HCC)    Thyroid  disease    Past Surgical History:  Procedure Laterality Date   CATARACT EXTRACTION W/PHACO Left 01/10/2020   Procedure: CATARACT EXTRACTION PHACO AND INTRAOCULAR LENS PLACEMENT (IOC) LEFT DIABETIC;  Surgeon: Ferol Rogue, MD;  Location: [facility];  Service: Ophthalmology;  Laterality: Left;  5.32 0:41.5   CATARACT EXTRACTION W/PHACO Right 02/07/2020   Procedure: CATARACT EXTRACTION PHACO AND INTRAOCULAR LENS PLACEMENT (IOC) RIGHT DIABETIC VISION BLUE 4.01 00:27.4;  Surgeon: Ferol Rogue, MD;  Location: [facility];  Service: Ophthalmology;  Laterality: Right;  Diabetic - oral meds   COLONOSCOPY WITH PROPOFOL  N/A 03/04/2016   Procedure: COLONOSCOPY WITH PROPOFOL ;  Surgeon: Lamar ONEIDA Holmes, MD;  Location: [facility];  Service: Endoscopy;  Laterality: N/A;   great toe amputation     matrixectomy     TONSILLECTOMY      Patient Active Problem List   Diagnosis Date Noted   Hypothyroidism 03/26/2022   UTI (urinary tract infection) 03/26/2022   Pneumonia due to COVID-19 virus 10/22/2020   Bipolar disorder (HCC) 10/22/2020   Acute encephalopathy 10/22/2020   Benign hypertensive kidney disease with chronic kidney disease 07/11/2019   Secondary hyperparathyroidism of renal origin (HCC) 07/11/2019   Class 1 obesity due to excess calories with serious comorbidity and body mass index (BMI) of 32.0 to 32.9 in adult 05/09/2019   Type 2 diabetes mellitus with stage 3 chronic kidney disease, without long-term current use of insulin  (HCC) 09/16/2017   Stage 3 chronic kidney disease (HCC) 10/04/2016   Essential hypertriglyceridemia 03/22/2016   Essential hypertension 03/17/2016   ONSET DATE: 2 years ago  REFERRING DIAG: Chronic right shoulder pain [M25.511, G89.29], Leg weakness, bilateral [R29.898]   THERAPY DIAG:  Muscle weakness (generalized)  Other lack of coordination  Difficulty in walking, not elsewhere classified  Other abnormalities of gait and mobility  Unsteadiness on feet  Rationale for Evaluation and Treatment: Rehabilitation  SUBJECTIVE:  SUBJECTIVE STATEMENT:  Pt reports she didn't sleep last night, for no particular reason, but states she is not feeling the best today because of it. Reports having unrated pain in low back and bilateral knees. Pt states she usually takes arthritis strength tylenol  before coming to therapy. Denies stumbles/falls.    PERTINENT HISTORY:  Pt presents to clinic today in transport chair. Pt has had consistent falls for the last 2 years, but she reports not having fallen in the last 6 months. Pt uses a 4WW at home c the brakes constantly on to avoid the rollator rolling outside  her BOS. Pt stated she does not like 2WW as she fell over one when attempting to rush to the toilet one night. Pt states she has dizzy spells, particularly when coming from a sit to stand or with head turns during gait. Pt also stated her dizziness has tapered off recently as well. She reports all these started around when she was hospitalized for covid and PNA 2 years ago. Pt has a bed rail in her bed. Installed it after falling out of bed multiple times. Pt has very limited shoulder ROM. She reports her husband helps around a lot at home with ADL and IADL like washing her hair. However, she feels his help is prohibiting her from getting better and requested us  to explain to him that she needs to attempt more tasks on her own.  PAIN:  Are you having pain? No pain while sitting, exercising, just with walking.   PRECAUTIONS: Fall  WEIGHT BEARING RESTRICTIONS: No  FALLS: Has patient fallen in last 6 months? No  LIVING ENVIRONMENT: Lives with: lives with their spouse Lives in: House/apartment Stairs: No Has following equipment at home: Single point cane, Environmental consultant - 2 wheeled, and Environmental consultant - 4 wheeled  PLOF: Independent  PATIENT GOALS: Would like to walk without AD, Work up strength in L and R arm.   OBJECTIVE:  Note: Objective measures were completed at evaluation unless otherwise noted.                                                                                                                             TREATMENT DATE 05/23/24:   Pt arrives to session in transport chair.   Unless otherwise stated, CGA was provided and gait belt donned in order to ensure pt safety throughout session.   In // bars with mirror feedback, performed the following gait training interventions:  Forward walking with light B UE support progressed to attempts at hovering hands  5 lap using B UE support Pt able to maintain reciprocal pattern today with no reverting back to heavy foot landing; increased heel  strike noted this date. Pt continuing to demonstrate slow gait speed and slightly wider BOS Added agility ladder Forward reciprocal stepping pattern Requires B UE support throughout, but cuing to use them as little as possible 4 laps Therapist cuing to transition to unilateral UE support on final lap,  but pt unsuccessful Ladder forces more normal BOS and increase heel strike at initial contact  Gait training using RW, 300 Cuing to carryover improved gait mechanics via imagining ladder on the floor for her to step over Continues to intermittently revert back to partial trunk/hip flexed posture with increased WBing through AD when fatigued, but very rarely today CGA/light min A for safety/steadying   Gait training using B HHA 151ft +2 min A Verbal cueing for increased gait speed & maintaining reciprocal gait pattern with increased step length and heel strike on initial contact   Dynamic balance intervention and B LE NMR at stairs of alternating heel taps to 1st step x20reps with B UE support on HRs and CGA for safety Cuing to ensure trunk/hip/knee extension through stance limb Great foot clearance to tap heels bilaterally    PATIENT EDUCATION:  Education details: POC  Person educated: Patient  Education method: Explanation  Education comprehension: verbalized understanding  HOME EXERCISE PROGRAM: Access Code: [email or web address] URL: [email or web address] Date: 05/17/2024 Prepared by: Peggye Linear  Exercises - Sit to Stand with Counter Support  - 1 x daily - 2 sets - 10 reps - Standing March with Counter Support  - 1 x daily - 3 sets - 12 reps - Seated Scapular Retraction  - 1 x daily - 3 sets - 12 reps - Heel Raises with Counter Support  - 1 x daily - 3 sets - 10 reps   GOALS: Goals reviewed with patient? Yes  SHORT TERM GOALS: Target date: 07/19/2024 Consistently perform HEP at least 4x/week to maintain progress made in PT to improve function and overall  QoL. Baseline: Initiated on 04/30/2024 Goal status: INITIAL  LONG TERM GOALS: Target date: 07/19/2024 Pt will decrease her STS time by 10 seconds to improve power and strength which will increase QoL.  Baseline: 33.69 sec B UE support Goal status: INITIAL  2.  Pt will decrease her TUG time by 10 seconds to reduce risk of falls and increase QoL.  Baseline: 42.64 sec with rollator Goal status: INITIAL  3.  Pt will decrease time by 0.25 m/s in order to increase gait speed to reduce fall risk. Baseline: 04/30/2024: 0.35 m/s using her personal rollator with brakes locked and close SBA/CGA for safety Goal status: INITIAL  4.  Pt will increase distance walked during to aid with community ambulation and reduce fall risk. Baseline: 04/30/2024: 103 feet, had to stop at 31min40seconds due to fatigue, (31.39 meters, Avg speed 0.3m/s) using her personal rollator with brakes locked and CGA for safety Goal status: INITIAL  5.  Pt will increase ABC score to 50% to increase her confidence with daily activities and improve overall QoL. Baseline: 30% Goal status: INITIAL  ASSESSMENT:  CLINICAL IMPRESSION:  Therapy session continued focus on improved gait mechanics and gait speed via use of agility ladder in // bars and +2 B HHA outside of // bars. Patient demonstrates significant improvement in ability to sustain improved gait mechanics using RW, except during turns or when becoming distracted (although conversing during gait doesn't result in as much of a decline in her gait mechanics as previously observed by this therapist). Patient demos ability to maintain improved step length, heel strike, and gait speed with only light min A in B HHA gait training - would benefit from continuation of this to decrease reliance on B UE support during gait. The pt will benefit from further skilled PT to improve these deficits in order to increase QOL, decrease fall  risk, and increase safety and independence with  ADLs and functional mobility.   OBJECTIVE IMPAIRMENTS: Abnormal gait, decreased activity tolerance, decreased balance, decreased coordination, decreased knowledge of use of DME, decreased mobility, difficulty walking, decreased ROM, decreased strength, dizziness, and improper body mechanics.   ACTIVITY LIMITATIONS: carrying, lifting, bending, standing, squatting, transfers, bed mobility, bathing, and locomotion level  PARTICIPATION LIMITATIONS: meal prep, cleaning, laundry, medication management, driving, shopping, community activity, and yard work  PERSONAL FACTORS: Past/current experiences, Time since onset of injury/illness/exacerbation, and 1-2 comorbidities:   are also affecting patient's functional outcome.   REHAB POTENTIAL: Good  CLINICAL DECISION MAKING: Evolving/moderate complexity  EVALUATION COMPLEXITY: Moderate  PLAN:  PT FREQUENCY: 2x/week  PT DURATION: 12 weeks  PLANNED INTERVENTIONS: 97110-Therapeutic exercises, 97530- Therapeutic activity, [id number]- Neuromuscular re-education, 97535- Self Care, [id number]- Manual therapy, [phone]- Gait training, Patient/Family education, Joint mobilization, and DME instructions  PLAN FOR NEXT SESSION:  *Progress Note* Contiue to advance toward LT goals. Pt still strugglign to improve gait speed, despite toelrable distance increases.  - continue sit<>stand B LE functional strengthening - continue +2 B HHA gait training to promote increased gait speed   Hanne Kegg, PT, DPT, NCS, CSRS Physical Therapist - [place]  [facility]  2:01 PM 05/23/24

## 2024-05-23 NOTE — Therapy (Signed)
 OUTPATIENT OCCUPATIONAL THERAPY ORTHO TREATMENT  Patient Name: Stacey Ball MRN: [id number] DOB:October 24, 1960, 63 y.o., female Today's Date: 05/07/2024  PCP: Dr. Alda Helling REFERRING PROVIDER: Dr. Alda Helling  END OF SESSION:   OT End of Session - 05/23/24 1403     Visit Number 6    Number of Visits 24    Date for OT Re-Evaluation 07/30/24    OT Start Time 1400    OT Stop Time 1445    OT Time Calculation (min) 45 min    Activity Tolerance Patient tolerated treatment well    Behavior During Therapy WFL for tasks assessed/performed          Past Medical History:  Diagnosis Date   Anemia    HX of   Arthritis    Bipolar 1 disorder (HCC)    Cancer (HCC)    skin cancer   Cataracts, bilateral    Diabetes mellitus without complication (HCC)    Dyspnea    on exertion (Pt states, out of shape)   Function kidney decreased    High cholesterol    Hypertension    Hypothyroidism    Psoriasis    Psoriatic arthritis (HCC)    Thyroid  disease    Past Surgical History:  Procedure Laterality Date   CATARACT EXTRACTION W/PHACO Left 01/10/2020   Procedure: CATARACT EXTRACTION PHACO AND INTRAOCULAR LENS PLACEMENT (IOC) LEFT DIABETIC;  Surgeon: Ferol Rogue, MD;  Location: [facility];  Service: Ophthalmology;  Laterality: Left;  5.32 0:41.5   CATARACT EXTRACTION W/PHACO Right 02/07/2020   Procedure: CATARACT EXTRACTION PHACO AND INTRAOCULAR LENS PLACEMENT (IOC) RIGHT DIABETIC VISION BLUE 4.01 00:27.4;  Surgeon: Ferol Rogue, MD;  Location: [facility];  Service: Ophthalmology;  Laterality: Right;  Diabetic - oral meds   COLONOSCOPY WITH PROPOFOL  N/A 03/04/2016   Procedure: COLONOSCOPY WITH PROPOFOL ;  Surgeon: Lamar ONEIDA Holmes, MD;  Location: [facility];  Service: Endoscopy;  Laterality: N/A;   great toe amputation     matrixectomy     TONSILLECTOMY     Patient Active Problem List   Diagnosis Date Noted   Hypothyroidism 03/26/2022   UTI (urinary  tract infection) 03/26/2022   Pneumonia due to COVID-19 virus 10/22/2020   Bipolar disorder (HCC) 10/22/2020   Acute encephalopathy 10/22/2020   Benign hypertensive kidney disease with chronic kidney disease 07/11/2019   Secondary hyperparathyroidism of renal origin (HCC) 07/11/2019   Class 1 obesity due to excess calories with serious comorbidity and body mass index (BMI) of 32.0 to 32.9 in adult 05/09/2019   Type 2 diabetes mellitus with stage 3 chronic kidney disease, without long-term current use of insulin  (HCC) 09/16/2017   Stage 3 chronic kidney disease (HCC) 10/04/2016   Essential hypertriglyceridemia 03/22/2016   Essential hypertension 03/17/2016   ONSET DATE: 03/26/22  REFERRING DIAG: M25.511,G89.29 (ICD-10-CM) - Chronic right shoulder pain   THERAPY DIAG:  Muscle weakness (generalized)  Other lack of coordination  Chronic right shoulder pain  Rationale for Evaluation and Treatment: Rehabilitation  SUBJECTIVE:  SUBJECTIVE STATEMENT: Pt. Arrived in a transport chair this afternoon. Pt accompanied by: self  PERTINENT HISTORY:  Pt returns today to restart therapy for her R shoulder. Pt was seen by this therapist in 2023 post R proximal humerus fx, non-surgical.  Pt attended 4 OT visits in 2023, but then self discharged, though she doesn't recall her reasoning for stopping. Pt did admit that she was stubborn, and should have kept going, as she remains quite limited with her shoulder ROM.  Pt also currently being seen by outpatient PT  to address balance/mobility deficits d/t hx of falling.  Per chart on 03/26/22, 63 y/o old female with PMH significant for bipolar disorder, anemia, cataracts, hyperlipidemia, hypertension, hypothyroidism, obesity, type 2 diabetes, CKD stage IIIb presented in the ED with c/o: generalized  weakness and recurrent falls. Patient has generalized weakness for some time which has gotten worse.  She had a fall yesterday and was evaluated outpatient and found  to have right humeral fracture and was placed in a sling.  Patient had additional fall yesterday, denies any loss of consciousness or head trauma or head injury. In the ED work-up revealed UA positive for UTI.  Lactic acid normal.  x-ray right shoulder shows humeral neck fracture. ED physician spoke with orthopedics to discuss need for urgent evaluation.  Patient was admitted for generalized weakness secondary to UTI,  started on IV antibiotics.  Orthopedics consulted,  recommended conservative management. Patient is not a candidate for surgical intervention.  Advised outpatient follow-up.  Patient completed antibiotics for 3 days. PT recommended  skilled nursing facility for rehab.  Patient feels better and want to be discharged.  Patient being discharged skilled nursing facility for rehab.   PRECAUTIONS: Fall  RED FLAGS: None   WEIGHT BEARING RESTRICTIONS: No  PAIN: 05/23/24: No pain reported 0/10 pain at rest or activity in R shoulder, but does report pain in bilat knees and low back  Are you having pain? Yes: NPRS scale: 6/10  Pain location: low back and bilat knees Pain description: achy Aggravating factors: prolonged standing/walking Relieving factors: rest, Tylenol   FALLS: Has patient fallen in last 6 months? Yes. Number of falls pt unsure, but reports 6 falls, but within more than 6 months   LIVING ENVIRONMENT: Lives with: lives with their spouse Lives in: first floor apt  Stairs: No Has following equipment at home: Counselling psychologist, Environmental consultant - 2 wheeled, Environmental consultant - 4 wheeled, Wheelchair (manual), Shower bench, bed side commode, and Grab bars, bed rail   PLOF:  Independent with basic ADLs, spouse and pt shared IADL responsibilities prior to R shoulder fx in 2023  PATIENT GOALS: I would like to get better movement in my shoulder.   NEXT MD VISIT: Pt unsure, Not for awhile.  OBJECTIVE:  Note: Objective measures were completed at Evaluation unless otherwise noted.  HAND  DOMINANCE: Right  ADLs: Overall ADLs: spouse provides assist as needed.  Pt reports, He's doing a lot now.  Transfers/ambulation related to ADLs: supv-modified indep with AD Eating: indep  Grooming: uses L non-dominant hand to brush hair; able to use R dominant hand to brush teeth with electric toothbrush Upper body dressing: set up-modified indep with donning sports bra and shirt overhead  Lower body dressing: occasional help with clothing set up, extra time and effort to don socks, slip on shoes, indep with panties and pants  Toileting: 3in1 over toilet, uses L hand at baseline for pericare; has tried a toilet wand in the past, but minimally effective  Bathing: spouse helps to wash hair and back, spouse helps to wash buttocks Tub shower transfers: supv-min A for lifting legs over tub shower with pt using transfer tub bench and grab bar  Equipment: see above  UPPER EXTREMITY ROM:     Active ROM Right eval Left Eval WNL throughout  Shoulder flexion 30 (155)   Shoulder abduction 51 (153)   Shoulder adduction    Shoulder extension    Shoulder internal rotation ~thumb to  L4-5    Shoulder external rotation 5 (90) arm held in 90 abd by OT   Elbow flexion    Elbow extension    Wrist flexion    Wrist extension    Wrist ulnar deviation    Wrist radial deviation    Wrist pronation    Wrist supination    (Blank rows = not tested)  UPPER EXTREMITY MMT:     MMT Right eval Left eval  Shoulder flexion 2 4+  Shoulder abduction 2 4+  Shoulder adduction    Shoulder extension    Shoulder internal rotation 4- 4+  Shoulder external rotation 2 4-  Middle trapezius    Lower trapezius    Elbow flexion 4 4+  Elbow extension 5 4+  Wrist flexion 5 4+  Wrist extension 5 4+  Wrist ulnar deviation    Wrist radial deviation    Wrist pronation    Wrist supination    (Blank rows = not tested)  HAND FUNCTION: Grip strength: Right: 37 lbs; Left: 37 lbs, Lateral pinch: Right: 13 lbs, Left:  9 lbs, and 3 point pinch: Right: 9 lbs, Left: 6 lbs  COORDINATION: NT at eval d/t time constraints; will test in upcoming sessions if indicated  05/10/24: 9 hole peg test L 36, R 40  SENSATION:  WFL  EDEMA: No visible edema  COGNITION: Overall cognitive status: History of cognitive impairments - at baseline, decreased memory, hx of bipolar disorder   OBSERVATIONS:  Pt pleasant, cooperative, verbalizes eagerness to improve R shoulder mobility.  TREATMENT DATE: 05/23/24   Therapeutic Exercise:  -AAROM/AROM for scapular elevation, depression, and rotation. -AAROM for shoulder  flexion, abduction, horizontal abduction. -Dowel climb on table top to promote R shoulder flexion x 3 reps with support required under the RUE with each ascent -1.5# dowel from bilateral shoulder flexion, chest press, and circular motion for 1set 10 reps each with occasional support at the right side of the dowel. -RUE strengthening with yellow theraband with OT providing active assist to RUE for stability and to reduce compensation: bilat shoulder horiz abd/add with mobile arm  support required at the right elbow, R shoulder flex/abd, ER/IR with cues to stabilize the the elbow at the lateral aspect of her trunk, abd, elbow flexion, and extension 1 set 10 reps each                                                                                                        PATIENT EDUCATION: Education details:  RUE strengthening  Person educated: Patient Education method: Explanation Education comprehension: verbalized understanding  HOME EXERCISE PROGRAM:  AAROM  for shoulder flexion, and abduction at the tabletop surface.  GOALS: Goals reviewed with patient? Yes  SHORT TERM GOALS: Target date: 06/18/24  Pt will be indep with HEP for increasing strength and ROM in the R shoulder. Baseline: Eval: To be initiated in upcoming sessions Goal status: INITIAL  2.  Pt will identify and implement 2-3 activity  modifications/AE in order to compensate for RUE weakness in order to maximize indep with BADLs.  Baseline: Eval: Educ not yet initiated  Goal status: INITIAL  LONG TERM GOALS: Target date: 07/30/24  1.  Pt will increase R active shoulder flexion and abd to 70 degrees or better to better engage the RUE into BADLs. Baseline: Eval: R shoulder flex 30, abd 51 Goal status: INITIAL   2.  Pt will increase active R shoulder ER to 30 degrees or better to enable brushing/combing R side of head with R dominant hand and arm propped as needed. Baseline: Eval: Pt brushes hair with L non-dominant arm; active R shoulder ER 5* with arm propped into abd  Goal status: INITIAL  3.  Pt will increase active R shoulder IR for increased thoroughness with posterior LB bathing.  Baseline: Eval: ~thumb to L4-5; pt reports spouse has to help with posterior in shower  Goal status: INITIAL  3. Pt will increase R shoulder strength by 1/2 muscle grade or more in order to better engage the RUE into BADLs.  Baseline: Eval: R shoulder grossly 2/5  Goal status: INITIAL  ASSESSMENT: CLINICAL IMPRESSION: Pt with good tolerance to RUE therapeutic exercises this date.  Noted crepitus near end range with R shoulder flexion and abd, and pt was cued to limit end range to limit this grinding, but pt denied pain throughout.  Pt. Continues to require constant support from OT for active assisted ROM to maximize form and end range and to minimize shoulder hiking while completing theraband and dowel climbs. Pt. requires consistent verbal cues for proper form, and technique during the session. Pt. Requested a visual handout for theraband exercises at home, however Pt. Is not yet ready for a HEP with theraband due to the amount, and type of assist required during the exercises at this time to ensure proper form, and technique while avoiding risk of reinjury. Pt. continues to benefit from skilled OT services to maximize safety and  independence in necessary daily tasks.     PERFORMANCE DEFICITS: in functional skills including ADLs, IADLs, coordination, ROM, strength, pain, flexibility, Gross motor control, mobility, balance, body mechanics, decreased knowledge of precautions, decreased knowledge of use of DME, and UE functional use, cognitive skills including memory, and psychosocial skills including coping strategies, environmental adaptation, habits, and routines and behaviors.   IMPAIRMENTS: are limiting patient from ADLs, IADLs, and leisure.   COMORBIDITIES: has co-morbidities such as bipolar disorder, CKD, HTN, DM2, arthritis that affects occupational performance. Patient will benefit from skilled OT to address above impairments and improve overall function.  MODIFICATION OR ASSISTANCE TO COMPLETE EVALUATION: Min-Moderate modification of tasks or assist with assess necessary to complete an evaluation.  OT OCCUPATIONAL PROFILE AND HISTORY: Detailed assessment: Review of records and additional review of physical, cognitive, psychosocial history related to current functional performance.  CLINICAL DECISION MAKING: Moderate - several treatment options, min-mod task modification necessary  REHAB POTENTIAL: Fair based on limited participation last episode of OT in 2023  EVALUATION COMPLEXITY: Moderate   PLAN:  OT FREQUENCY: 2x/week  OT DURATION: 12 weeks  PLANNED INTERVENTIONS: 97168 OT Re-evaluation, 97535 self care/ADL training, 02889 therapeutic exercise, 97530 therapeutic activity, 97140 manual therapy, 97010 moist heat, 97010 cryotherapy, 97750 Physical Performance Testing, passive range of motion, psychosocial skills training, energy conservation, coping strategies training, patient/family education, and DME and/or AE instructions  RECOMMENDED OTHER SERVICES: None at this time (Pt currently seeing PT for mobility deficits)  CONSULTED AND AGREED WITH PLAN OF CARE: Patient  PLAN FOR NEXT SESSION: see  above Richardson Otter, MS, OTR/L    05/23/2024,  2:05 PM

## 2024-05-24 ENCOUNTER — Ambulatory Visit: Admitting: Physical Therapy

## 2024-05-28 ENCOUNTER — Ambulatory Visit: Admitting: Physical Therapy

## 2024-05-28 ENCOUNTER — Ambulatory Visit

## 2024-05-28 DIAGNOSIS — R2689 Other abnormalities of gait and mobility: Secondary | ICD-10-CM

## 2024-05-28 DIAGNOSIS — R278 Other lack of coordination: Secondary | ICD-10-CM

## 2024-05-28 DIAGNOSIS — M6281 Muscle weakness (generalized): Secondary | ICD-10-CM

## 2024-05-28 DIAGNOSIS — R262 Difficulty in walking, not elsewhere classified: Secondary | ICD-10-CM

## 2024-05-28 DIAGNOSIS — R2681 Unsteadiness on feet: Secondary | ICD-10-CM

## 2024-05-28 NOTE — Therapy (Signed)
 OUTPATIENT PHYSICAL THERAPY TREATMENT Physical Therapy Progress Note   Dates of reporting period  04/26/2024   to   05/28/2024     Patient Name: Stacey Ball MRN: [id number] DOB:[birth year], 63 y.o., female Today's Date: 05/28/2024  PCP: Alla Amis MD REFERRING PROVIDER: Jonette, Minor  END OF SESSION:   PT End of Session - 05/28/24 1108     Visit Number 10    Number of Visits 24    Date for PT Re-Evaluation 07/19/24    Progress Note Due on Visit 10    PT Start Time 1110    PT Stop Time 1145    PT Time Calculation (min) 35 min    Equipment Utilized During Treatment Gait belt    Activity Tolerance Patient tolerated treatment well    Behavior During Therapy WFL for tasks assessed/performed             Past Medical History:  Diagnosis Date   Anemia    HX of   Arthritis    Bipolar 1 disorder (HCC)    Cancer (HCC)    skin cancer   Cataracts, bilateral    Diabetes mellitus without complication (HCC)    Dyspnea    on exertion (Pt states, out of shape)   Function kidney decreased    High cholesterol    Hypertension    Hypothyroidism    Psoriasis    Psoriatic arthritis (HCC)    Thyroid  disease    Past Surgical History:  Procedure Laterality Date   CATARACT EXTRACTION W/PHACO Left 01/10/2020   Procedure: CATARACT EXTRACTION PHACO AND INTRAOCULAR LENS PLACEMENT (IOC) LEFT DIABETIC;  Surgeon: Ferol Rogue, MD;  Location: [facility];  Service: Ophthalmology;  Laterality: Left;  5.32 0:41.5   CATARACT EXTRACTION W/PHACO Right 02/07/2020   Procedure: CATARACT EXTRACTION PHACO AND INTRAOCULAR LENS PLACEMENT (IOC) RIGHT DIABETIC VISION BLUE 4.01 00:27.4;  Surgeon: Ferol Rogue, MD;  Location: [facility];  Service: Ophthalmology;  Laterality: Right;  Diabetic - oral meds   COLONOSCOPY WITH PROPOFOL  N/A 03/04/2016   Procedure: COLONOSCOPY WITH PROPOFOL ;  Surgeon: Lamar ONEIDA Holmes, MD;  Location: [facility];  Service: Endoscopy;  Laterality:  N/A;   great toe amputation     matrixectomy     TONSILLECTOMY     Patient Active Problem List   Diagnosis Date Noted   Hypothyroidism 03/26/2022   UTI (urinary tract infection) 03/26/2022   Pneumonia due to COVID-19 virus 10/22/2020   Bipolar disorder (HCC) 10/22/2020   Acute encephalopathy 10/22/2020   Benign hypertensive kidney disease with chronic kidney disease 07/11/2019   Secondary hyperparathyroidism of renal origin (HCC) 07/11/2019   Class 1 obesity due to excess calories with serious comorbidity and body mass index (BMI) of 32.0 to 32.9 in adult 05/09/2019   Type 2 diabetes mellitus with stage 3 chronic kidney disease, without long-term current use of insulin  (HCC) 09/16/2017   Stage 3 chronic kidney disease (HCC) 10/04/2016   Essential hypertriglyceridemia 03/22/2016   Essential hypertension 03/17/2016   ONSET DATE: 2 years ago  REFERRING DIAG: Chronic right shoulder pain [M25.511, G89.29], Leg weakness, bilateral [R29.898]   THERAPY DIAG:  Muscle weakness (generalized)  Other lack of coordination  Difficulty in walking, not elsewhere classified  Other abnormalities of gait and mobility  Unsteadiness on feet  Rationale for Evaluation and Treatment: Rehabilitation  SUBJECTIVE:  SUBJECTIVE STATEMENT:   Patient with late arrival this date. Patient reports that she is doing okay & has nothing new to report since last session. Denies stumbles/falls. Patient reporting some knee pain that is consistent with baseline. Patient reports she is using RW at home instead of rollator.     PERTINENT HISTORY:  Pt presents to clinic today in transport chair. Pt has had consistent falls for the last 2 years, but she reports not having fallen in the last 6 months. Pt uses a 4WW at home c the  brakes constantly on to avoid the rollator rolling outside her BOS. Pt stated she does not like 2WW as she fell over one when attempting to rush to the toilet one night. Pt states she has dizzy spells, particularly when coming from a sit to stand or with head turns during gait. Pt also stated her dizziness has tapered off recently as well. She reports all these started around when she was hospitalized for covid and PNA 2 years ago. Pt has a bed rail in her bed. Installed it after falling out of bed multiple times. Pt has very limited shoulder ROM. She reports her husband helps around a lot at home with ADL and IADL like washing her hair. However, she feels his help is prohibiting her from getting better and requested us  to explain to him that she needs to attempt more tasks on her own.  PAIN:  Are you having pain? No pain while sitting, exercising, just with walking.   PRECAUTIONS: Fall  WEIGHT BEARING RESTRICTIONS: No  FALLS: Has patient fallen in last 6 months? No  LIVING ENVIRONMENT: Lives with: lives with their spouse Lives in: House/apartment Stairs: No Has following equipment at home: Single point cane, Environmental consultant - 2 wheeled, and Environmental consultant - 4 wheeled  PLOF: Independent  PATIENT GOALS: Would like to walk without AD, Work up strength in L and R arm.   OBJECTIVE:  Note: Objective measures were completed at evaluation unless otherwise noted.                                                                                                                             TREATMENT DATE 05/28/24:    Pt arrives to session in transport chair.   Unless otherwise stated, CGA was provided and gait belt donned in order to ensure pt safety throughout session.  Activities-specific Balance Confidence Scale:  Score: 54.375% Increased risk of falls in community-dwelling, older adults <80% (79.89%)  0% = no confidence - 100% = complete confidence (ANPTA Core Set of Outcome Measures for Adults with  Neurologic Conditions, 2018)   The Activities-Specific Balance Confidence (ABC) Scale 0% 10 20 30  40 50 60 70 80 90 100% No confidence<->completely confident  "How confident are you that you will not lose your balance or become unsteady when you . . .   Date tested 05/28/24  Walk around the house 80%  2. Walk up or down stairs  10%  3. Bend over and pick up a slipper from in front of a closet floor 70%  4. Reach for a small can off a shelf at eye level 100%  5. Stand on tip toes and reach for something above your head 40%  6. Stand on a chair and reach for something 0%  7. Sweep the floor 90%  8. Walk outside the house to a car parked in the driveway 90%  9. Get into or out of a car 100%  10. Walk across a parking lot to the mall 90%  11. Walk up or down a ramp 80%  12. Walk in a crowded mall where people rapidly walk past you 80%  13. Are bumped into by people as you walk through the mall 20%  14. Step onto or off of an escalator while you are holding onto the railing 10%  15. Step onto or off an escalator while holding onto parcels such that you cannot hold onto the railing 10%  16. Walk outside on icy sidewalks 10%  Total: #/16 54.375     Five times Sit to Stand Test (FTSS) "Stand up and sit down as quickly as possible 5 times, keeping your arms folded across your chest."    TIME: 29.18 seconds w/ BUE support on armrests   38.34 w/ BUEs at knees  Times > 13.6 seconds is associated with increased disability and morbidity (Guralnik, 2000) Times > 15 seconds is predictive of recurrent falls in healthy individuals aged 25 and older (Buatois, et al., 2008) Normal performance values in community dwelling individuals aged 27 and older (Bohannon, 2006): 60-69 years: 11.4 seconds 70-79 years: 12.6 seconds 80-89 years: 14.8 seconds  MCID: >= 2.3 seconds for Vestibular Disorders (Meretta, 2006)   10 Meter Walk Test: Patient instructed to walk 10 meters (32.8 ft) as quickly and as  safely as possible at their normal speed x2 and at a fast speed x2. Time measured from 2 meter mark to 8 meter mark to accommodate ramp-up and ramp-down.  Normal speed 1: 0.41 m/s (24.62 seconds) Normal speed 2: 0.48 m/s (21.01 seconds) Average Normal speed: 0.45 m/s using RW, CGA Fast speed 1: 0.56 m/s (17.94 seconds) Fast speed 2: 0.499 m/s (20.04 seconds) Average Fast speed: 0.53 m/s using RW, CGA Cut off scores: <0.4 m/s = household Ambulator, 0.4-0.8 m/s = limited community Ambulator, >0.8 m/s = community Ambulator, >1.2 m/s = crossing a street, <1.0 = increased fall risk MCID 0.05 m/s (small), 0.13 m/s (moderate), 0.06 m/s (significant)  (ANPTA Core Set of Outcome Measures for Adults with Neurologic Conditions, 2018)   Participated in Timed Up and Go (TUG): 1st trial: 29.36 seconds 2nd trial: 30.52 seconds  Average: 29.94 seconds w/ BUE support to stand, RW for gait with CGA Patient demonstrates high fall risk as indicated by requiring >13.5seconds to complete the TUG.    2 Min Walk Test:  Instructed patient to ambulate as quickly and as safely as possible for 2 minutes using LRAD. Patient was allowed to take standing rest breaks without stopping the test, but if the patient required a sitting rest break the clock would be stopped and the test would be over.  Results: 206 feet (62.78 meters, Avg speed 0.61m/s) using a RW with skilled CGA. Results indicate that the patient has reduced endurance with ambulation compared to age matched norms.  Age Matched Norms for : 21-69 yo M: 16 F: 62, 65-79 yo M: 70 F: 471, 25-89 yo M: 417  F: 392 MDC: 58.21 meters (190.98 feet) or 50 meters (ANPTA Core Set of Outcome Measures for Adults with Neurologic Conditions, 2018)   Educated patient on results of assessments today & her progress with therapy thus far.    PATIENT EDUCATION:  Education details: POC  Person educated: Patient  Education method: Explanation  Education comprehension:  verbalized understanding  HOME EXERCISE PROGRAM: Access Code: [email or web address] URL: [email or web address] Date: 05/17/2024 Prepared by: Peggye Linear  Exercises - Sit to Stand with Counter Support  - 1 x daily - 2 sets - 10 reps - Standing March with Counter Support  - 1 x daily - 3 sets - 12 reps - Seated Scapular Retraction  - 1 x daily - 3 sets - 12 reps - Heel Raises with Counter Support  - 1 x daily - 3 sets - 10 reps   GOALS: Goals reviewed with patient? Yes  SHORT TERM GOALS: Target date: 07/19/2024  Consistently perform HEP at least 4x/week to maintain progress made in PT to improve function and overall QoL. Baseline: Initiated on 04/30/2024 05/28/24: updated on 05/17/24 Goal status: IN PROGRESS   LONG TERM GOALS: Target date: 07/19/2024  Pt will decrease her STS time by 10 seconds to improve power and strength which will increase QoL.  Baseline: 33.69 sec B UE support 05/28/24: 29.18 sec w/ BUE support at armrests  38.34 w/ BUEs at knees Goal status: IN PROGRESS   2.  Pt will decrease her TUG time by 10 seconds to reduce risk of falls and increase QoL.  Baseline: 42.64 sec with rollator 05/28/24: 29.37 sec with RW Goal status: MET & continue   3.  Pt will decrease time by 0.25 m/s in order to increase gait speed to reduce fall risk. Baseline: 04/30/2024: 0.35 m/s using her personal rollator with brakes locked and close SBA/CGA for safety 05/28/24: Average Normal speed: 0.45 m/s with RW, CGA  Goal status: IN PROGRESS   4.  Pt will increase distance walked during to aid with community ambulation and reduce fall risk. Baseline: 04/30/2024: 103 feet, had to stop at 27min40seconds due to fatigue, (31.39 meters, Avg speed 0.68m/s) using her personal rollator with brakes locked and CGA for safety 05/28/24: 206 ft. 62.78 meters, Avg speed 0.104m/s) using a RW with skilled CGA.  Goal status: IN PROGRESS   5.  Pt will increase ABC score to 80% to increase her  confidence with daily activities and improve overall QoL. Baseline: 30% 05/28/24: 54.375%  Goal status: MET & upgraded on 8/11 from 50% > 80%    ASSESSMENT:  CLINICAL IMPRESSION:  Patient continues to demonstrate difficulty with coming to stand without UE support when completing 5xSTS & standing portion of TUG. Patient able to demonstrate improved gait speed from initial evaluation, however continues to demonstrate difficulty with navigating turns with RW & decreased gait speed noted when conversing with therapist. Patient continues to demonstrate decreased endurance, as evidenced by reported fatigue at conclusion of session. Patient's condition has the potential to improve in response to therapy. Maximum improvement is yet to be obtained. The anticipated improvement is attainable and reasonable in a generally predictable time.       OBJECTIVE IMPAIRMENTS: Abnormal gait, decreased activity tolerance, decreased balance, decreased coordination, decreased knowledge of use of DME, decreased mobility, difficulty walking, decreased ROM, decreased strength, dizziness, and improper body mechanics.   ACTIVITY LIMITATIONS: carrying, lifting, bending, standing, squatting, transfers, bed mobility, bathing, and locomotion level  PARTICIPATION LIMITATIONS: meal prep, cleaning, laundry, medication  management, driving, shopping, community activity, and yard work  PERSONAL FACTORS: Past/current experiences, Time since onset of injury/illness/exacerbation, and 1-2 comorbidities:   are also affecting patient's functional outcome.   REHAB POTENTIAL: Good  CLINICAL DECISION MAKING: Evolving/moderate complexity  EVALUATION COMPLEXITY: Moderate  PLAN:  PT FREQUENCY: 2x/week  PT DURATION: 12 weeks  PLANNED INTERVENTIONS: 97110-Therapeutic exercises, 97530- Therapeutic activity, 97112- Neuromuscular re-education, 97535- Self Care, [id number]- Manual therapy, [phone]- Gait training, Patient/Family education, Joint  mobilization, and DME instructions  PLAN FOR NEXT SESSION:  Assess HEP adherence; adjust as needed Gait with turning with RW; ask about turning to navigate home environment B HHA for continued increased gait speed Increase ambulation distance to improve endurance Continue sit to stand for BLE strengthening     Chiquita Silvan, SPT Physical Therapy Student - [facility]  [facility]  11:51 AM 05/28/24

## 2024-05-28 NOTE — Therapy (Signed)
 OUTPATIENT OCCUPATIONAL THERAPY ORTHO TREATMENT  Patient Name: Stacey Ball MRN: [id number] DOB:01/02/1961, 63 y.o., female Today's Date: 05/07/2024  PCP: Dr. Alda Helling REFERRING PROVIDER: Dr. Alda Helling  END OF SESSION:   OT End of Session - 05/28/24 1643     Visit Number 7    Number of Visits 24    Date for OT Re-Evaluation 07/30/24    Progress Note Due on Visit 10    OT Start Time 1145    OT Stop Time 1230    OT Time Calculation (min) 45 min    Equipment Utilized During Treatment walker    Activity Tolerance Patient tolerated treatment well    Behavior During Therapy WFL for tasks assessed/performed         Past Medical History:  Diagnosis Date   Anemia    HX of   Arthritis    Bipolar 1 disorder (HCC)    Cancer (HCC)    skin cancer   Cataracts, bilateral    Diabetes mellitus without complication (HCC)    Dyspnea    on exertion (Pt states, out of shape)   Function kidney decreased    High cholesterol    Hypertension    Hypothyroidism    Psoriasis    Psoriatic arthritis (HCC)    Thyroid  disease    Past Surgical History:  Procedure Laterality Date   CATARACT EXTRACTION W/PHACO Left 01/10/2020   Procedure: CATARACT EXTRACTION PHACO AND INTRAOCULAR LENS PLACEMENT (IOC) LEFT DIABETIC;  Surgeon: Ferol Rogue, MD;  Location: [facility];  Service: Ophthalmology;  Laterality: Left;  5.32 0:41.5   CATARACT EXTRACTION W/PHACO Right 02/07/2020   Procedure: CATARACT EXTRACTION PHACO AND INTRAOCULAR LENS PLACEMENT (IOC) RIGHT DIABETIC VISION BLUE 4.01 00:27.4;  Surgeon: Ferol Rogue, MD;  Location: [facility];  Service: Ophthalmology;  Laterality: Right;  Diabetic - oral meds   COLONOSCOPY WITH PROPOFOL  N/A 03/04/2016   Procedure: COLONOSCOPY WITH PROPOFOL ;  Surgeon: Lamar ONEIDA Holmes, MD;  Location: [facility];  Service: Endoscopy;  Laterality: N/A;   great toe amputation     matrixectomy     TONSILLECTOMY     Patient Active  Problem List   Diagnosis Date Noted   Hypothyroidism 03/26/2022   UTI (urinary tract infection) 03/26/2022   Pneumonia due to COVID-19 virus 10/22/2020   Bipolar disorder (HCC) 10/22/2020   Acute encephalopathy 10/22/2020   Benign hypertensive kidney disease with chronic kidney disease 07/11/2019   Secondary hyperparathyroidism of renal origin (HCC) 07/11/2019   Class 1 obesity due to excess calories with serious comorbidity and body mass index (BMI) of 32.0 to 32.9 in adult 05/09/2019   Type 2 diabetes mellitus with stage 3 chronic kidney disease, without long-term current use of insulin  (HCC) 09/16/2017   Stage 3 chronic kidney disease (HCC) 10/04/2016   Essential hypertriglyceridemia 03/22/2016   Essential hypertension 03/17/2016   ONSET DATE: 03/26/22  REFERRING DIAG: M25.511,G89.29 (ICD-10-CM) - Chronic right shoulder pain   THERAPY DIAG:  Muscle weakness (generalized)  Other lack of coordination  Chronic right shoulder pain  Rationale for Evaluation and Treatment: Rehabilitation  SUBJECTIVE:  SUBJECTIVE STATEMENT: Pt confident that her spouse will be willing to help her with her HEP.   Pt accompanied by: self  PERTINENT HISTORY:  Pt returns today to restart therapy for her R shoulder. Pt was seen by this therapist in 2023 post R proximal humerus fx, non-surgical.  Pt attended 4 OT visits in 2023, but then self discharged, though she doesn't recall her  reasoning for stopping. Pt did admit that she was stubborn, and should have kept going, as she remains quite limited with her shoulder ROM. Pt also currently being seen by outpatient PT  to address balance/mobility deficits d/t hx of falling.  Per chart on 03/26/22, 63 y/o old female with PMH significant for bipolar disorder, anemia, cataracts, hyperlipidemia, hypertension, hypothyroidism, obesity, type 2 diabetes, CKD stage IIIb presented in the ED with c/o: generalized  weakness and recurrent falls. Patient has generalized  weakness for some time which has gotten worse.  She had a fall yesterday and was evaluated outpatient and found to have right humeral fracture and was placed in a sling.  Patient had additional fall yesterday, denies any loss of consciousness or head trauma or head injury. In the ED work-up revealed UA positive for UTI.  Lactic acid normal.  x-ray right shoulder shows humeral neck fracture. ED physician spoke with orthopedics to discuss need for urgent evaluation.  Patient was admitted for generalized weakness secondary to UTI,  started on IV antibiotics.  Orthopedics consulted,  recommended conservative management. Patient is not a candidate for surgical intervention.  Advised outpatient follow-up.  Patient completed antibiotics for 3 days. PT recommended  skilled nursing facility for rehab.  Patient feels better and want to be discharged.  Patient being discharged skilled nursing facility for rehab.   PRECAUTIONS: Fall  RED FLAGS: None   WEIGHT BEARING RESTRICTIONS: No  PAIN: 05/28/24: No pain reported 0/10 pain at rest or activity in R shoulder, but does report pain in bilat knees and low back  Are you having pain? Yes: NPRS scale: 6/10  Pain location: low back and bilat knees Pain description: achy Aggravating factors: prolonged standing/walking Relieving factors: rest, Tylenol   FALLS: Has patient fallen in last 6 months? Yes. Number of falls pt unsure, but reports 6 falls, but within more than 6 months   LIVING ENVIRONMENT: Lives with: lives with their spouse Lives in: first floor apt  Stairs: No Has following equipment at home: Counselling psychologist, Environmental consultant - 2 wheeled, Environmental consultant - 4 wheeled, Wheelchair (manual), Shower bench, bed side commode, and Grab bars, bed rail   PLOF:  Independent with basic ADLs, spouse and pt shared IADL responsibilities prior to R shoulder fx in 2023  PATIENT GOALS: I would like to get better movement in my shoulder.   NEXT MD VISIT: Pt unsure, Not for  awhile.  OBJECTIVE:  Note: Objective measures were completed at Evaluation unless otherwise noted.  HAND DOMINANCE: Right  ADLs: Overall ADLs: spouse provides assist as needed.  Pt reports, He's doing a lot now.  Transfers/ambulation related to ADLs: supv-modified indep with AD Eating: indep  Grooming: uses L non-dominant hand to brush hair; able to use R dominant hand to brush teeth with electric toothbrush Upper body dressing: set up-modified indep with donning sports bra and shirt overhead  Lower body dressing: occasional help with clothing set up, extra time and effort to don socks, slip on shoes, indep with panties and pants  Toileting: 3in1 over toilet, uses L hand at baseline for pericare; has tried a toilet wand in the past, but minimally effective  Bathing: spouse helps to wash hair and back, spouse helps to wash buttocks Tub shower transfers: supv-min A for lifting legs over tub shower with pt using transfer tub bench and grab bar  Equipment: see above  UPPER EXTREMITY ROM:     Active ROM Right eval Left Eval WNL throughout  Shoulder flexion 30 (  155)   Shoulder abduction 51 (153)   Shoulder adduction    Shoulder extension    Shoulder internal rotation ~thumb to L4-5    Shoulder external rotation 5 (90) arm held in 90 abd by OT   Elbow flexion    Elbow extension    Wrist flexion    Wrist extension    Wrist ulnar deviation    Wrist radial deviation    Wrist pronation    Wrist supination    (Blank rows = not tested)  UPPER EXTREMITY MMT:     MMT Right eval Left eval  Shoulder flexion 2 4+  Shoulder abduction 2 4+  Shoulder adduction    Shoulder extension    Shoulder internal rotation 4- 4+  Shoulder external rotation 2 4-  Middle trapezius    Lower trapezius    Elbow flexion 4 4+  Elbow extension 5 4+  Wrist flexion 5 4+  Wrist extension 5 4+  Wrist ulnar deviation    Wrist radial deviation    Wrist pronation    Wrist supination    (Blank rows =  not tested)  HAND FUNCTION: Grip strength: Right: 37 lbs; Left: 37 lbs, Lateral pinch: Right: 13 lbs, Left: 9 lbs, and 3 point pinch: Right: 9 lbs, Left: 6 lbs  COORDINATION: NT at eval d/t time constraints; will test in upcoming sessions if indicated  05/10/24: 9 hole peg test L 36, R 40  SENSATION:  WFL  EDEMA: No visible edema  COGNITION: Overall cognitive status: History of cognitive impairments - at baseline, decreased memory, hx of bipolar disorder   OBSERVATIONS:  Pt pleasant, cooperative, verbalizes eagerness to improve R shoulder mobility.  TREATMENT DATE: 05/28/24   Therapeutic Exercise: -Focus on HEP with issue of yellow theraband for RUE strengthening: -Seated row, chest press, R elbow flexion, ER with elbows at sides (attempted with and without band), IR with elbow at side x2 sets 10 reps.  OT assist to anchor band with IR and also demonstrated anchoring around door knob -BUE AROM for shoulder flex and abd without resistance (instructed to minimize ROM arc to decrease R shoulder hike and elbow flexion; Pt cued to reach forward past her knees to initiate shoulder flexion)  Self Care: -Written handout reviewed for exercises noted above; provided written cues and strategies for anchoring band for each exercise, and ways for spouse to assist to ensure R shoulder depression is managed with exercises noted above.  Also encouraged performing HEP in front of mirror for visual feedback to minimize R shoulder hike.                                                                                                         PATIENT EDUCATION: Education details: HEP Person educated: Patient Education method: Explanation, vc, tactile cues, written handout Education comprehension: verbalized understanding, demonstrated understanding with multimodal cueing; further training needed  HOME EXERCISE PROGRAM: Access Code: [email or web address] URL: [email or web address] Date:  05/28/2024 Prepared by: Inocente Blazing  Exercises - Seated Shoulder Row with Resistance Anchored at Feet  - 1 x  daily - 3-5 x weekly - 2-3 sets - 10 reps - Seated Chest Press with Resistance Band  - 1 x daily - 3-5 x weekly - 2-3 sets - 10 reps - Seated Elbow Flexion with Self-Anchored Resistance  - 1 x daily - 3-5 x weekly - 2-3 sets - 10 reps - Standing Shoulder Flexion to 90 Degrees  - 1 x daily - 3-5 x weekly - 2-3 sets - 10 reps - Shoulder Abduction - Thumbs Up  - 1 x daily - 3-5 x weekly - 2-3 sets - 10 reps - Shoulder External Rotation and Scapular Retraction with Resistance  - 1 x daily - 3-5 x weekly - 2-3 sets - 10 reps - Standing Shoulder Internal Rotation with Anchored Resistance  - 1 x daily - 7 x weekly - 3 sets - 10 reps   AAROM  for shoulder flexion, and abduction at the tabletop surface.  GOALS: Goals reviewed with patient? Yes  SHORT TERM GOALS: Target date: 06/18/24  Pt will be indep with HEP for increasing strength and ROM in the R shoulder. Baseline: Eval: To be initiated in upcoming sessions Goal status: INITIAL  2.  Pt will identify and implement 2-3 activity modifications/AE in order to compensate for RUE weakness in order to maximize indep with BADLs.  Baseline: Eval: Educ not yet initiated  Goal status: INITIAL  LONG TERM GOALS: Target date: 07/30/24  1.  Pt will increase R active shoulder flexion and abd to 70 degrees or better to better engage the RUE into BADLs. Baseline: Eval: R shoulder flex 30, abd 51 Goal status: INITIAL   2.  Pt will increase active R shoulder ER to 30 degrees or better to enable brushing/combing R side of head with R dominant hand and arm propped as needed. Baseline: Eval: Pt brushes hair with L non-dominant arm; active R shoulder ER 5* with arm propped into abd  Goal status: INITIAL  3.  Pt will increase active R shoulder IR for increased thoroughness with posterior LB bathing.  Baseline: Eval: ~thumb to L4-5; pt reports spouse  has to help with posterior in shower  Goal status: INITIAL  3. Pt will increase R shoulder strength by 1/2 muscle grade or more in order to better engage the RUE into BADLs.  Baseline: Eval: R shoulder grossly 2/5  Goal status: INITIAL  ASSESSMENT: CLINICAL IMPRESSION: Pt remains eager to begin working on R shoulder exercises in the home.  OT issued yellow theraband exercises and provided extensive multimodal cueing to perform exercises with reduced substitution patterns, reinforcing performing above within a very limited ROM arc, specifically no resistance for R shoulder flexion or abd, and remaining exercises may be completed with minimal resistance and support from spouse to depress R shoulder.  OT demonstrated how to scan QR code on phone and play video instruction of HEP noted above.  Will continue to review Hep to maximize indep with form and technique, but pt is showing improved awareness of substitution patterns, but does require continued vc for downgrading exercises to perform with less resistance or no resistance.  Pt. continues to benefit from skilled OT services to maximize safety and independence in necessary daily tasks.     PERFORMANCE DEFICITS: in functional skills including ADLs, IADLs, coordination, ROM, strength, pain, flexibility, Gross motor control, mobility, balance, body mechanics, decreased knowledge of precautions, decreased knowledge of use of DME, and UE functional use, cognitive skills including memory, and psychosocial skills including coping strategies, environmental adaptation, habits, and  routines and behaviors.   IMPAIRMENTS: are limiting patient from ADLs, IADLs, and leisure.   COMORBIDITIES: has co-morbidities such as bipolar disorder, CKD, HTN, DM2, arthritis that affects occupational performance. Patient will benefit from skilled OT to address above impairments and improve overall function.  MODIFICATION OR ASSISTANCE TO COMPLETE EVALUATION: Min-Moderate  modification of tasks or assist with assess necessary to complete an evaluation.  OT OCCUPATIONAL PROFILE AND HISTORY: Detailed assessment: Review of records and additional review of physical, cognitive, psychosocial history related to current functional performance.  CLINICAL DECISION MAKING: Moderate - several treatment options, min-mod task modification necessary  REHAB POTENTIAL: Fair based on limited participation last episode of OT in 2023  EVALUATION COMPLEXITY: Moderate   PLAN:  OT FREQUENCY: 2x/week  OT DURATION: 12 weeks  PLANNED INTERVENTIONS: 97168 OT Re-evaluation, 97535 self care/ADL training, 02889 therapeutic exercise, 97530 therapeutic activity, 97140 manual therapy, 97010 moist heat, 97010 cryotherapy, 97750 Physical Performance Testing, passive range of motion, psychosocial skills training, energy conservation, coping strategies training, patient/family education, and DME and/or AE instructions  RECOMMENDED OTHER SERVICES: None at this time (Pt currently seeing PT for mobility deficits)  CONSULTED AND AGREED WITH PLAN OF CARE: Patient  PLAN FOR NEXT SESSION: see above   Inocente Blazing, MS, OTR/L   05/28/2024, 4:44 PM

## 2024-05-31 ENCOUNTER — Ambulatory Visit

## 2024-05-31 DIAGNOSIS — M6281 Muscle weakness (generalized): Secondary | ICD-10-CM

## 2024-05-31 DIAGNOSIS — M25611 Stiffness of right shoulder, not elsewhere classified: Secondary | ICD-10-CM

## 2024-05-31 DIAGNOSIS — R278 Other lack of coordination: Secondary | ICD-10-CM

## 2024-05-31 DIAGNOSIS — M25511 Pain in right shoulder: Secondary | ICD-10-CM

## 2024-05-31 DIAGNOSIS — G8929 Other chronic pain: Secondary | ICD-10-CM

## 2024-05-31 DIAGNOSIS — R2689 Other abnormalities of gait and mobility: Secondary | ICD-10-CM

## 2024-05-31 DIAGNOSIS — R262 Difficulty in walking, not elsewhere classified: Secondary | ICD-10-CM

## 2024-05-31 NOTE — Therapy (Signed)
 OUTPATIENT PHYSICAL THERAPY TREATMENT   Patient Name: Stacey Ball MRN: [id number] DOB:14-Jun-1961, 63 y.o., female Today's Date: 05/31/2024  PCP: Alla Amis MD REFERRING PROVIDER: Jonette, Minor  END OF SESSION:   PT End of Session - 05/31/24 1452     Visit Number 11    Number of Visits 24    Date for PT Re-Evaluation 07/19/24    Progress Note Due on Visit 10    PT Start Time 1452    PT Stop Time 1530    PT Time Calculation (min) 38 min    Equipment Utilized During Treatment Gait belt    Activity Tolerance Patient tolerated treatment well    Behavior During Therapy WFL for tasks assessed/performed         Past Medical History:  Diagnosis Date   Anemia    HX of   Arthritis    Bipolar 1 disorder (HCC)    Cancer (HCC)    skin cancer   Cataracts, bilateral    Diabetes mellitus without complication (HCC)    Dyspnea    on exertion (Pt states, out of shape)   Function kidney decreased    High cholesterol    Hypertension    Hypothyroidism    Psoriasis    Psoriatic arthritis (HCC)    Thyroid  disease    Past Surgical History:  Procedure Laterality Date   CATARACT EXTRACTION W/PHACO Left 01/10/2020   Procedure: CATARACT EXTRACTION PHACO AND INTRAOCULAR LENS PLACEMENT (IOC) LEFT DIABETIC;  Surgeon: Ferol Rogue, MD;  Location: [facility];  Service: Ophthalmology;  Laterality: Left;  5.32 0:41.5   CATARACT EXTRACTION W/PHACO Right 02/07/2020   Procedure: CATARACT EXTRACTION PHACO AND INTRAOCULAR LENS PLACEMENT (IOC) RIGHT DIABETIC VISION BLUE 4.01 00:27.4;  Surgeon: Ferol Rogue, MD;  Location: [facility];  Service: Ophthalmology;  Laterality: Right;  Diabetic - oral meds   COLONOSCOPY WITH PROPOFOL  N/A 03/04/2016   Procedure: COLONOSCOPY WITH PROPOFOL ;  Surgeon: Lamar ONEIDA Holmes, MD;  Location: [facility];  Service: Endoscopy;  Laterality: N/A;   great toe amputation     matrixectomy     TONSILLECTOMY     Patient Active Problem List    Diagnosis Date Noted   Hypothyroidism 03/26/2022   UTI (urinary tract infection) 03/26/2022   Pneumonia due to COVID-19 virus 10/22/2020   Bipolar disorder (HCC) 10/22/2020   Acute encephalopathy 10/22/2020   Benign hypertensive kidney disease with chronic kidney disease 07/11/2019   Secondary hyperparathyroidism of renal origin (HCC) 07/11/2019   Class 1 obesity due to excess calories with serious comorbidity and body mass index (BMI) of 32.0 to 32.9 in adult 05/09/2019   Type 2 diabetes mellitus with stage 3 chronic kidney disease, without long-term current use of insulin  (HCC) 09/16/2017   Stage 3 chronic kidney disease (HCC) 10/04/2016   Essential hypertriglyceridemia 03/22/2016   Essential hypertension 03/17/2016   ONSET DATE: 2 years ago  REFERRING DIAG: Chronic right shoulder pain [M25.511, G89.29], Leg weakness, bilateral [R29.898]   THERAPY DIAG:  Muscle weakness (generalized)  Other lack of coordination  Difficulty in walking, not elsewhere classified  Right shoulder pain, unspecified chronicity  Stiffness of right shoulder, not elsewhere classified  Other abnormalities of gait and mobility  Rationale for Evaluation and Treatment: Rehabilitation  SUBJECTIVE:  SUBJECTIVE STATEMENT:  Pt reports she has been doing her exercises and has been trying to maintain her activity levels.  Pt notes the R shoulder is still bothering her and she is not able to perform full ROM yet.    PERTINENT HISTORY:  Pt presents to clinic today in transport chair. Pt has had consistent falls for the last 2 years, but she reports not having fallen in the last 6 months. Pt uses a 4WW at home c the brakes constantly on to avoid the rollator rolling outside her BOS. Pt stated she does not like 2WW as she fell  over one when attempting to rush to the toilet one night. Pt states she has dizzy spells, particularly when coming from a sit to stand or with head turns during gait. Pt also stated her dizziness has tapered off recently as well. She reports all these started around when she was hospitalized for covid and PNA 2 years ago. Pt has a bed rail in her bed. Installed it after falling out of bed multiple times. Pt has very limited shoulder ROM. She reports her husband helps around a lot at home with ADL and IADL like washing her hair. However, she feels his help is prohibiting her from getting better and requested us  to explain to him that she needs to attempt more tasks on her own.  PAIN:  Are you having pain? No pain while sitting, exercising, just with walking.   PRECAUTIONS: Fall  WEIGHT BEARING RESTRICTIONS: No  FALLS: Has patient fallen in last 6 months? No  LIVING ENVIRONMENT: Lives with: lives with their spouse Lives in: House/apartment Stairs: No Has following equipment at home: Single point cane, Environmental consultant - 2 wheeled, and Environmental consultant - 4 wheeled  PLOF: Independent  PATIENT GOALS: Would like to walk without AD, Work up strength in L and R arm.   OBJECTIVE:  Note: Objective measures were completed at evaluation unless otherwise noted.                                                                                                                             TREATMENT DATE 05/31/24:    Pt arrives to session in transport chair.   Unless otherwise stated, CGA was provided and gait belt donned in order to ensure pt safety throughout session.   Gait Training:   Resisted gait with 2# AW's using RW. SBA. 2x150'. Seated rest break between bouts of ambulation.    Side stepping with 2# AW's donned in // bars: 4x down and back    Gait Training without AW's and use of RW, 2x150' with verbal cues for step length and remaining within the walker   TherEx:  Standing hip abduction with 2# AW  donned, 2x10 each LE Standing hip extension with 2# AW donned, 2x10 each LE Standing hamstring curls with 2# AW donned, 2x10 each LE Standing marches with 2# AW donned, 2x10 each LE Standing heel raises with 2# AW  donned, 2x10    TherAct:  STS 2x10, with seated rest b/t bouts. Reliant on BUE support on thighs    PATIENT EDUCATION:  Education details: POC  Person educated: Patient  Education method: Explanation  Education comprehension: verbalized understanding  HOME EXERCISE PROGRAM: Access Code: [email or web address] URL: [email or web address] Date: 05/17/2024 Prepared by: Peggye Linear  Exercises - Sit to Stand with Counter Support  - 1 x daily - 2 sets - 10 reps - Standing March with Counter Support  - 1 x daily - 3 sets - 12 reps - Seated Scapular Retraction  - 1 x daily - 3 sets - 12 reps - Heel Raises with Counter Support  - 1 x daily - 3 sets - 10 reps   GOALS: Goals reviewed with patient? Yes  SHORT TERM GOALS: Target date: 07/19/2024  Consistently perform HEP at least 4x/week to maintain progress made in PT to improve function and overall QoL. Baseline: Initiated on 04/30/2024 05/28/24: updated on 05/17/24 Goal status: IN PROGRESS   LONG TERM GOALS: Target date: 07/19/2024  Pt will decrease her STS time by 10 seconds to improve power and strength which will increase QoL.  Baseline: 33.69 sec B UE support 05/28/24: 29.18 sec w/ BUE support at armrests  38.34 w/ BUEs at knees Goal status: IN PROGRESS   2.  Pt will decrease her TUG time by 10 seconds to reduce risk of falls and increase QoL.  Baseline: 42.64 sec with rollator 05/28/24: 29.37 sec with RW Goal status: MET & continue   3.  Pt will decrease time by 0.25 m/s in order to increase gait speed to reduce fall risk. Baseline: 04/30/2024: 0.35 m/s using her personal rollator with brakes locked and close SBA/CGA for safety 05/28/24: Average Normal speed: 0.45 m/s with RW, CGA  Goal status: IN  PROGRESS   4.  Pt will increase distance walked during to aid with community ambulation and reduce fall risk. Baseline: 04/30/2024: 103 feet, had to stop at 44min40seconds due to fatigue, (31.39 meters, Avg speed 0.58m/s) using her personal rollator with brakes locked and CGA for safety 05/28/24: 206 ft. 62.78 meters, Avg speed 0.74m/s) using a RW with skilled CGA.  Goal status: IN PROGRESS   5.  Pt will increase ABC score to 80% to increase her confidence with daily activities and improve overall QoL. Baseline: 30% 05/28/24: 54.375%  Goal status: MET & upgraded on 8/11 from 50% > 80%    ASSESSMENT:  CLINICAL IMPRESSION:   Pt responded well to the exercises however noted to be fatigued at the end of the session.  Pt was given LE exercises within the // bars for safety, to improve overall LE strength necessary for improved ambulation attempts.  Pt does require frequent verbal and visual cueing for proper form of the exercises, but overall performed well.  Pt still lacks endurance levels to consistently walk with more normalized gait pattern and also requires verbal cuing for staying within the walker to prevent hunched posture.   Pt will continue to benefit from skilled therapy to address remaining deficits in order to improve overall QoL and return to PLOF.       OBJECTIVE IMPAIRMENTS: Abnormal gait, decreased activity tolerance, decreased balance, decreased coordination, decreased knowledge of use of DME, decreased mobility, difficulty walking, decreased ROM, decreased strength, dizziness, and improper body mechanics.   ACTIVITY LIMITATIONS: carrying, lifting, bending, standing, squatting, transfers, bed mobility, bathing, and locomotion level  PARTICIPATION LIMITATIONS: meal prep, cleaning, laundry, medication management, driving,  shopping, community activity, and yard work  PERSONAL FACTORS: Past/current experiences, Time since onset of injury/illness/exacerbation, and 1-2  comorbidities:   are also affecting patient's functional outcome.   REHAB POTENTIAL: Good  CLINICAL DECISION MAKING: Evolving/moderate complexity  EVALUATION COMPLEXITY: Moderate  PLAN:  PT FREQUENCY: 2x/week  PT DURATION: 12 weeks  PLANNED INTERVENTIONS: 97110-Therapeutic exercises, 97530- Therapeutic activity, 97112- Neuromuscular re-education, 97535- Self Care, [id number]- Manual therapy, [phone]- Gait training, Patient/Family education, Joint mobilization, and DME instructions  PLAN FOR NEXT SESSION:  Assess HEP adherence; adjust as needed Gait with turning with RW; ask about turning to navigate home environment B HHA for continued increased gait speed Increase ambulation distance to improve endurance Continue sit to stand for BLE strengthening    Fonda Simpers, PT, DPT Physical Therapist - [facility]  05/31/24, 5:49 PM

## 2024-06-03 NOTE — Therapy (Signed)
 OUTPATIENT OCCUPATIONAL THERAPY ORTHO TREATMENT  Patient Name: Stacey Ball MRN: [id number] DOB:09-04-1961, 63 y.o., female Today's Date: 05/07/2024  PCP: Dr. Alda Helling REFERRING PROVIDER: Dr. Alda Helling  END OF SESSION:   OT End of Session - 06/03/24 2135     Visit Number 8    Number of Visits 24    Date for OT Re-Evaluation 07/30/24    Progress Note Due on Visit 10    OT Start Time 1530    OT Stop Time 1615    OT Time Calculation (min) 45 min    Equipment Utilized During Treatment transport chair    Activity Tolerance Patient tolerated treatment well    Behavior During Therapy WFL for tasks assessed/performed         Past Medical History:  Diagnosis Date   Anemia    HX of   Arthritis    Bipolar 1 disorder (HCC)    Cancer (HCC)    skin cancer   Cataracts, bilateral    Diabetes mellitus without complication (HCC)    Dyspnea    on exertion (Pt states, out of shape)   Function kidney decreased    High cholesterol    Hypertension    Hypothyroidism    Psoriasis    Psoriatic arthritis (HCC)    Thyroid  disease    Past Surgical History:  Procedure Laterality Date   CATARACT EXTRACTION W/PHACO Left 01/10/2020   Procedure: CATARACT EXTRACTION PHACO AND INTRAOCULAR LENS PLACEMENT (IOC) LEFT DIABETIC;  Surgeon: Ferol Rogue, MD;  Location: [facility];  Service: Ophthalmology;  Laterality: Left;  5.32 0:41.5   CATARACT EXTRACTION W/PHACO Right 02/07/2020   Procedure: CATARACT EXTRACTION PHACO AND INTRAOCULAR LENS PLACEMENT (IOC) RIGHT DIABETIC VISION BLUE 4.01 00:27.4;  Surgeon: Ferol Rogue, MD;  Location: [facility];  Service: Ophthalmology;  Laterality: Right;  Diabetic - oral meds   COLONOSCOPY WITH PROPOFOL  N/A 03/04/2016   Procedure: COLONOSCOPY WITH PROPOFOL ;  Surgeon: Lamar ONEIDA Holmes, MD;  Location: [facility];  Service: Endoscopy;  Laterality: N/A;   great toe amputation     matrixectomy     TONSILLECTOMY     Patient  Active Problem List   Diagnosis Date Noted   Hypothyroidism 03/26/2022   UTI (urinary tract infection) 03/26/2022   Pneumonia due to COVID-19 virus 10/22/2020   Bipolar disorder (HCC) 10/22/2020   Acute encephalopathy 10/22/2020   Benign hypertensive kidney disease with chronic kidney disease 07/11/2019   Secondary hyperparathyroidism of renal origin (HCC) 07/11/2019   Class 1 obesity due to excess calories with serious comorbidity and body mass index (BMI) of 32.0 to 32.9 in adult 05/09/2019   Type 2 diabetes mellitus with stage 3 chronic kidney disease, without long-term current use of insulin  (HCC) 09/16/2017   Stage 3 chronic kidney disease (HCC) 10/04/2016   Essential hypertriglyceridemia 03/22/2016   Essential hypertension 03/17/2016   ONSET DATE: 03/26/22  REFERRING DIAG: M25.511,G89.29 (ICD-10-CM) - Chronic right shoulder pain   THERAPY DIAG:  Muscle weakness (generalized)  Other lack of coordination  Chronic right shoulder pain  Rationale for Evaluation and Treatment: Rehabilitation  SUBJECTIVE:  SUBJECTIVE STATEMENT: Pt reports that she used the table leg to anchor her theraband at home to increase indep with HEP, rather than using the doorknob or asking her husband to help.   Pt accompanied by: self  PERTINENT HISTORY:  Pt returns today to restart therapy for her R shoulder. Pt was seen by this therapist in 2023 post R proximal humerus fx, non-surgical.  Pt attended 4 OT visits in 2023, but then self discharged, though she doesn't recall her reasoning for stopping. Pt did admit that she was stubborn, and should have kept going, as she remains quite limited with her shoulder ROM. Pt also currently being seen by outpatient PT  to address balance/mobility deficits d/t hx of falling.  Per chart on 03/26/22, 63 y/o old female with PMH significant for bipolar disorder, anemia, cataracts, hyperlipidemia, hypertension, hypothyroidism, obesity, type 2 diabetes, CKD stage IIIb  presented in the ED with c/o: generalized  weakness and recurrent falls. Patient has generalized weakness for some time which has gotten worse.  She had a fall yesterday and was evaluated outpatient and found to have right humeral fracture and was placed in a sling.  Patient had additional fall yesterday, denies any loss of consciousness or head trauma or head injury. In the ED work-up revealed UA positive for UTI.  Lactic acid normal.  x-ray right shoulder shows humeral neck fracture. ED physician spoke with orthopedics to discuss need for urgent evaluation.  Patient was admitted for generalized weakness secondary to UTI,  started on IV antibiotics.  Orthopedics consulted,  recommended conservative management. Patient is not a candidate for surgical intervention.  Advised outpatient follow-up.  Patient completed antibiotics for 3 days. PT recommended  skilled nursing facility for rehab.  Patient feels better and want to be discharged.  Patient being discharged skilled nursing facility for rehab.   PRECAUTIONS: Fall  RED FLAGS: None   WEIGHT BEARING RESTRICTIONS: No  PAIN: 05/31/24: No pain reported 0/10 pain at rest or activity in R shoulder, but does report pain in bilat knees and low back  Are you having pain? Yes: NPRS scale: 6/10  Pain location: low back and bilat knees Pain description: achy Aggravating factors: prolonged standing/walking Relieving factors: rest, Tylenol   FALLS: Has patient fallen in last 6 months? Yes. Number of falls pt unsure, but reports 6 falls, but within more than 6 months   LIVING ENVIRONMENT: Lives with: lives with their spouse Lives in: first floor apt  Stairs: No Has following equipment at home: Counselling psychologist, Environmental consultant - 2 wheeled, Environmental consultant - 4 wheeled, Wheelchair (manual), Shower bench, bed side commode, and Grab bars, bed rail   PLOF:  Independent with basic ADLs, spouse and pt shared IADL responsibilities prior to R shoulder fx in 2023  PATIENT GOALS:  I would like to get better movement in my shoulder.   NEXT MD VISIT: Pt unsure, Not for awhile.  OBJECTIVE:  Note: Objective measures were completed at Evaluation unless otherwise noted.  HAND DOMINANCE: Right  ADLs: Overall ADLs: spouse provides assist as needed.  Pt reports, He's doing a lot now.  Transfers/ambulation related to ADLs: supv-modified indep with AD Eating: indep  Grooming: uses L non-dominant hand to brush hair; able to use R dominant hand to brush teeth with electric toothbrush Upper body dressing: set up-modified indep with donning sports bra and shirt overhead  Lower body dressing: occasional help with clothing set up, extra time and effort to don socks, slip on shoes, indep with panties and pants  Toileting: 3in1 over toilet, uses L hand at baseline for pericare; has tried a toilet wand in the past, but minimally effective  Bathing: spouse helps to wash hair and back, spouse helps to wash buttocks Tub shower transfers: supv-min A for lifting legs over tub shower with pt using transfer tub bench and grab bar  Equipment: see above  UPPER EXTREMITY ROM:  Active ROM Right eval Left Eval WNL throughout  Shoulder flexion 30 (155)   Shoulder abduction 51 (153)   Shoulder adduction    Shoulder extension    Shoulder internal rotation ~thumb to L4-5    Shoulder external rotation 5 (90) arm held in 90 abd by OT   Elbow flexion    Elbow extension    Wrist flexion    Wrist extension    Wrist ulnar deviation    Wrist radial deviation    Wrist pronation    Wrist supination    (Blank rows = not tested)  UPPER EXTREMITY MMT:     MMT Right eval Left eval  Shoulder flexion 2 4+  Shoulder abduction 2 4+  Shoulder adduction    Shoulder extension    Shoulder internal rotation 4- 4+  Shoulder external rotation 2 4-  Middle trapezius    Lower trapezius    Elbow flexion 4 4+  Elbow extension 5 4+  Wrist flexion 5 4+  Wrist extension 5 4+  Wrist ulnar  deviation    Wrist radial deviation    Wrist pronation    Wrist supination    (Blank rows = not tested)  HAND FUNCTION: Grip strength: Right: 37 lbs; Left: 37 lbs, Lateral pinch: Right: 13 lbs, Left: 9 lbs, and 3 point pinch: Right: 9 lbs, Left: 6 lbs  COORDINATION: NT at eval d/t time constraints; will test in upcoming sessions if indicated  05/10/24: 9 hole peg test L 36, R 40  SENSATION:  WFL  EDEMA: No visible edema  COGNITION: Overall cognitive status: History of cognitive impairments - at baseline, decreased memory, hx of bipolar disorder   OBSERVATIONS:  Pt pleasant, cooperative, verbalizes eagerness to improve R shoulder mobility.  TREATMENT DATE: 05/31/24  Therapeutic Exercise: -Continued focus on HEP for RUE strengthening, including yellow theraband and AROM without resistance - With yellow theraband, pt completed seated row, chest press, IR with elbow at side (OT assist to anchor band with IR), R elbow flexion x2 sets 10 reps; min vc for techniques to anchor band using L hand or foot for above noted exercises. -BUE AROM (no theraband) for shoulder flex and abd; instructed to minimize ROM arc to decrease R shoulder hike and elbow flexion.  Pt cued to reach forward past her knees to initiate shoulder flexion. ER with elbows at sides, requiring mod vc and demo.                                                                                                       PATIENT EDUCATION: Education details: HEP Person educated: Patient Education method: Explanation, vc, tactile cues, written handout Education comprehension: verbalized understanding, demonstrated understanding with multimodal cueing; further training needed  HOME EXERCISE PROGRAM: Access Code: [email or web address] URL: [email or web address] Date: 05/28/2024 Prepared by: Inocente Blazing  Exercises - Seated Shoulder Row with Resistance Anchored at Feet  - 1 x daily - 3-5 x weekly - 2-3 sets - 10 reps - Seated  Chest Press with Resistance Band  - 1 x daily - 3-5 x weekly -  2-3 sets - 10 reps - Seated Elbow Flexion with Self-Anchored Resistance  - 1 x daily - 3-5 x weekly - 2-3 sets - 10 reps - Standing Shoulder Flexion to 90 Degrees  - 1 x daily - 3-5 x weekly - 2-3 sets - 10 reps - Shoulder Abduction - Thumbs Up  - 1 x daily - 3-5 x weekly - 2-3 sets - 10 reps - Shoulder External Rotation and Scapular Retraction with Resistance  - 1 x daily - 3-5 x weekly - 2-3 sets - 10 reps - Standing Shoulder Internal Rotation with Anchored Resistance  - 1 x daily - 7 x weekly - 3 sets - 10 reps   AAROM  for shoulder flexion, and abduction at the tabletop surface.  GOALS: Goals reviewed with patient? Yes  SHORT TERM GOALS: Target date: 06/18/24  Pt will be indep with HEP for increasing strength and ROM in the R shoulder. Baseline: Eval: To be initiated in upcoming sessions Goal status: INITIAL  2.  Pt will identify and implement 2-3 activity modifications/AE in order to compensate for RUE weakness in order to maximize indep with BADLs.  Baseline: Eval: Educ not yet initiated  Goal status: INITIAL  LONG TERM GOALS: Target date: 07/30/24  1.  Pt will increase R active shoulder flexion and abd to 70 degrees or better to better engage the RUE into BADLs. Baseline: Eval: R shoulder flex 30, abd 51 Goal status: INITIAL   2.  Pt will increase active R shoulder ER to 30 degrees or better to enable brushing/combing R side of head with R dominant hand and arm propped as needed. Baseline: Eval: Pt brushes hair with L non-dominant arm; active R shoulder ER 5* with arm propped into abd  Goal status: INITIAL  3.  Pt will increase active R shoulder IR for increased thoroughness with posterior LB bathing.  Baseline: Eval: ~thumb to L4-5; pt reports spouse has to help with posterior in shower  Goal status: INITIAL  3. Pt will increase R shoulder strength by 1/2 muscle grade or more in order to better engage the RUE  into BADLs.  Baseline: Eval: R shoulder grossly 2/5  Goal status: INITIAL  ASSESSMENT: CLINICAL IMPRESSION: Pt demonstrated fairly good recall to perform HEP noted above with written handout.  Pt demonstrated improvements with self anchoring band, but reports that she did not have her husband provide support to reduce R shoulder hiking.  Pt continues to require mod vc to minimize substitution patterns, and min-mod A to prevent R shoulder hiking within a very reduced ROM arc for all shoulder planes, as R shoulder immediately hikes with any attempt to lift arm against gravity.  Pt's R shoulder is palpably and audibly crepitus near end passive shoulder ranges (passively WNL), and presents with anterior sublux.  Anticipate some strengthening gains in accessory muscles outside of rotator cuff to maximize functional use of the RUE for daily tasks.  Will continue to review Hep to maximize indep with form and technique, but pt is showing improved awareness of substitution patterns, but does require continued vc for downgrading exercises to perform with less resistance or no resistance.  Pt. continues to benefit from skilled OT services to maximize safety and independence in necessary daily tasks.     PERFORMANCE DEFICITS: in functional skills including ADLs, IADLs, coordination, ROM, strength, pain, flexibility, Gross motor control, mobility, balance, body mechanics, decreased knowledge of precautions, decreased knowledge of use of DME, and UE functional use, cognitive skills including memory,  and psychosocial skills including coping strategies, environmental adaptation, habits, and routines and behaviors.   IMPAIRMENTS: are limiting patient from ADLs, IADLs, and leisure.   COMORBIDITIES: has co-morbidities such as bipolar disorder, CKD, HTN, DM2, arthritis that affects occupational performance. Patient will benefit from skilled OT to address above impairments and improve overall function.  MODIFICATION OR  ASSISTANCE TO COMPLETE EVALUATION: Min-Moderate modification of tasks or assist with assess necessary to complete an evaluation.  OT OCCUPATIONAL PROFILE AND HISTORY: Detailed assessment: Review of records and additional review of physical, cognitive, psychosocial history related to current functional performance.  CLINICAL DECISION MAKING: Moderate - several treatment options, min-mod task modification necessary  REHAB POTENTIAL: Fair based on limited participation last episode of OT in 2023  EVALUATION COMPLEXITY: Moderate   PLAN:  OT FREQUENCY: 2x/week  OT DURATION: 12 weeks  PLANNED INTERVENTIONS: 97168 OT Re-evaluation, 97535 self care/ADL training, 02889 therapeutic exercise, 97530 therapeutic activity, 97140 manual therapy, 97010 moist heat, 97010 cryotherapy, 97750 Physical Performance Testing, passive range of motion, psychosocial skills training, energy conservation, coping strategies training, patient/family education, and DME and/or AE instructions  RECOMMENDED OTHER SERVICES: None at this time (Pt currently seeing PT for mobility deficits)  CONSULTED AND AGREED WITH PLAN OF CARE: Patient  PLAN FOR NEXT SESSION: see above   Inocente Blazing, MS, OTR/L   06/03/2024, 9:37 PM

## 2024-06-04 ENCOUNTER — Ambulatory Visit

## 2024-06-07 ENCOUNTER — Ambulatory Visit: Admitting: Physical Therapy

## 2024-06-07 ENCOUNTER — Ambulatory Visit

## 2024-06-07 ENCOUNTER — Encounter: Payer: Self-pay | Admitting: Physical Therapy

## 2024-06-07 DIAGNOSIS — M25611 Stiffness of right shoulder, not elsewhere classified: Secondary | ICD-10-CM

## 2024-06-07 DIAGNOSIS — R278 Other lack of coordination: Secondary | ICD-10-CM

## 2024-06-07 DIAGNOSIS — R2689 Other abnormalities of gait and mobility: Secondary | ICD-10-CM

## 2024-06-07 DIAGNOSIS — M6281 Muscle weakness (generalized): Secondary | ICD-10-CM | POA: Diagnosis not present

## 2024-06-07 DIAGNOSIS — R2681 Unsteadiness on feet: Secondary | ICD-10-CM

## 2024-06-07 DIAGNOSIS — R262 Difficulty in walking, not elsewhere classified: Secondary | ICD-10-CM

## 2024-06-07 NOTE — Therapy (Signed)
 OUTPATIENT PHYSICAL THERAPY TREATMENT   Patient Name: Stacey Ball MRN: [id number] DOB:Dec 07, 1960, 63 y.o., female Today's Date: 06/07/2024  PCP: Alla Amis MD REFERRING PROVIDER: Jonette, Minor  END OF SESSION:   PT End of Session - 06/07/24 1100     Visit Number 12    Number of Visits 24    Date for PT Re-Evaluation 07/19/24    Progress Note Due on Visit 10    PT Start Time 1102    PT Stop Time 1145    PT Time Calculation (min) 43 min    Equipment Utilized During Treatment Gait belt    Activity Tolerance Patient tolerated treatment well    Behavior During Therapy WFL for tasks assessed/performed          Past Medical History:  Diagnosis Date   Anemia    HX of   Arthritis    Bipolar 1 disorder (HCC)    Cancer (HCC)    skin cancer   Cataracts, bilateral    Diabetes mellitus without complication (HCC)    Dyspnea    on exertion (Pt states, out of shape)   Function kidney decreased    High cholesterol    Hypertension    Hypothyroidism    Psoriasis    Psoriatic arthritis (HCC)    Thyroid  disease    Past Surgical History:  Procedure Laterality Date   CATARACT EXTRACTION W/PHACO Left 01/10/2020   Procedure: CATARACT EXTRACTION PHACO AND INTRAOCULAR LENS PLACEMENT (IOC) LEFT DIABETIC;  Surgeon: Ferol Rogue, MD;  Location: [facility];  Service: Ophthalmology;  Laterality: Left;  5.32 0:41.5   CATARACT EXTRACTION W/PHACO Right 02/07/2020   Procedure: CATARACT EXTRACTION PHACO AND INTRAOCULAR LENS PLACEMENT (IOC) RIGHT DIABETIC VISION BLUE 4.01 00:27.4;  Surgeon: Ferol Rogue, MD;  Location: [facility];  Service: Ophthalmology;  Laterality: Right;  Diabetic - oral meds   COLONOSCOPY WITH PROPOFOL  N/A 03/04/2016   Procedure: COLONOSCOPY WITH PROPOFOL ;  Surgeon: Lamar ONEIDA Holmes, MD;  Location: [facility];  Service: Endoscopy;  Laterality: N/A;   great toe amputation     matrixectomy     TONSILLECTOMY     Patient Active Problem List    Diagnosis Date Noted   Hypothyroidism 03/26/2022   UTI (urinary tract infection) 03/26/2022   Pneumonia due to COVID-19 virus 10/22/2020   Bipolar disorder (HCC) 10/22/2020   Acute encephalopathy 10/22/2020   Benign hypertensive kidney disease with chronic kidney disease 07/11/2019   Secondary hyperparathyroidism of renal origin (HCC) 07/11/2019   Class 1 obesity due to excess calories with serious comorbidity and body mass index (BMI) of 32.0 to 32.9 in adult 05/09/2019   Type 2 diabetes mellitus with stage 3 chronic kidney disease, without long-term current use of insulin  (HCC) 09/16/2017   Stage 3 chronic kidney disease (HCC) 10/04/2016   Essential hypertriglyceridemia 03/22/2016   Essential hypertension 03/17/2016   ONSET DATE: 2 years ago  REFERRING DIAG: Chronic right shoulder pain [M25.511, G89.29], Leg weakness, bilateral [R29.898]   THERAPY DIAG:  Muscle weakness (generalized)  Other lack of coordination  Difficulty in walking, not elsewhere classified  Other abnormalities of gait and mobility  Unsteadiness on feet  Rationale for Evaluation and Treatment: Rehabilitation  SUBJECTIVE:  SUBJECTIVE STATEMENT:  Pt reported that she has been feeling woozy & unsteady on her feet, reporting that both knees have been hurting. Pt reporting knee pain when she stands up, but once she gets moving, they feel better. Pt reports that she has been doing as many of her home exercises as possible. Pt denies any medical updates, falls or stumbles since previous session.      PERTINENT HISTORY:  Pt presents to clinic today in transport chair. Pt has had consistent falls for the last 2 years, but she reports not having fallen in the last 6 months. Pt uses a 4WW at home c the brakes constantly on to avoid  the rollator rolling outside her BOS. Pt stated she does not like 2WW as she fell over one when attempting to rush to the toilet one night. Pt states she has dizzy spells, particularly when coming from a sit to stand or with head turns during gait. Pt also stated her dizziness has tapered off recently as well. She reports all these started around when she was hospitalized for covid and PNA 2 years ago. Pt has a bed rail in her bed. Installed it after falling out of bed multiple times. Pt has very limited shoulder ROM. She reports her husband helps around a lot at home with ADL and IADL like washing her hair. However, she feels his help is prohibiting her from getting better and requested us  to explain to him that she needs to attempt more tasks on her own.  PAIN:  Are you having pain? No pain while sitting, exercising, just with walking.   PRECAUTIONS: Fall  WEIGHT BEARING RESTRICTIONS: No  FALLS: Has patient fallen in last 6 months? No  LIVING ENVIRONMENT: Lives with: lives with their spouse Lives in: House/apartment Stairs: No Has following equipment at home: Single point cane, Environmental consultant - 2 wheeled, and Environmental consultant - 4 wheeled  PLOF: Independent  PATIENT GOALS: Would like to walk without AD, Work up strength in L and R arm.   OBJECTIVE:  Note: Objective measures were completed at evaluation unless otherwise noted.                                                                                                                             TREATMENT DATE 06/07/24:   Pt arrives to session in transport chair.   Unless otherwise stated, CGA was provided and gait belt donned in order to ensure pt safety throughout session.  Patient educated on POC for today's session to promote increased endurance via increasing ambulation distance and limiting rest breaks as appropriate. Patient agreeable to plan.   Gait Training: Gait training use of RW, 300' with verbal cueing for maintaining step length &  increased gait speed. Pt requiring 2 min. seated rest break following activity.   Gait training, 2 person HHA 300' with verbal cueing for increased step length, increased gait speed.  Patient reporting that it was difficult to maintain speed,  step length while conversing throughout trial.  Patient requiring 3 min. seated rest break following activity.   STS 2x5 with airex pad in seat for increased seat height, verbal cueing to reduce BUE support at knees, verbal cueing for slow descent to chair to improve eccentric control when sitting.  Patient reported that when completing STS at home, she found herself having to use BUE support.  Patient reporting some knee discomfort throughout activity. Patient reporting that leaning forward prior to standing made activity easier & more tolerable.  Patient taking short ~10 sec rest break between repetitions.   2x5 step ups with BUE support at handrails on first step @ stairs First set, leading with RLE. Second set, leading with LLE.  Significant glute weakness noted bilaterally; delayed hip extension.   Pt transported to OT session via transport chair.        PATIENT EDUCATION:  Education details: POC  Person educated: Patient  Education method: Explanation  Education comprehension: verbalized understanding  HOME EXERCISE PROGRAM: Access Code: [email or web address] URL: [email or web address] Date: 05/17/2024 Prepared by: Peggye Linear  Exercises - Sit to Stand with Counter Support  - 1 x daily - 2 sets - 10 reps - Standing March with Counter Support  - 1 x daily - 3 sets - 12 reps - Seated Scapular Retraction  - 1 x daily - 3 sets - 12 reps - Heel Raises with Counter Support  - 1 x daily - 3 sets - 10 reps   GOALS: Goals reviewed with patient? Yes  SHORT TERM GOALS: Target date: 07/19/2024  Consistently perform HEP at least 4x/week to maintain progress made in PT to improve function and overall QoL. Baseline: Initiated on  04/30/2024 05/28/24: updated on 05/17/24 Goal status: IN PROGRESS   LONG TERM GOALS: Target date: 07/19/2024  Pt will decrease her STS time by 10 seconds to improve power and strength which will increase QoL.  Baseline: 33.69 sec B UE support 05/28/24: 29.18 sec w/ BUE support at armrests  38.34 w/ BUEs at knees Goal status: IN PROGRESS   2.  Pt will decrease her TUG time by 10 seconds to reduce risk of falls and increase QoL.  Baseline: 42.64 sec with rollator 05/28/24: 29.37 sec with RW Goal status: MET & continue   3.  Pt will decrease time by 0.25 m/s in order to increase gait speed to reduce fall risk. Baseline: 04/30/2024: 0.35 m/s using her personal rollator with brakes locked and close SBA/CGA for safety 05/28/24: Average Normal speed: 0.45 m/s with RW, CGA  Goal status: IN PROGRESS   4.  Pt will increase distance walked during to aid with community ambulation and reduce fall risk. Baseline: 04/30/2024: 103 feet, had to stop at 57min40seconds due to fatigue, (31.39 meters, Avg speed 0.47m/s) using her personal rollator with brakes locked and CGA for safety 05/28/24: 206 ft. 62.78 meters, Avg speed 0.53m/s) using a RW with skilled CGA.  Goal status: IN PROGRESS   5.  Pt will increase ABC score to 80% to increase her confidence with daily activities and improve overall QoL. Baseline: 30% 05/28/24: 54.375%  Goal status: MET & upgraded on 8/11 from 50% > 80%    ASSESSMENT:  CLINICAL IMPRESSION:   Pt responded well to the exercises focused on promoting endurance and functional LE strength, however noted to be fatigued at the end of the session.  Pt requiring verbal cueing throughout gait trials for increased step length, gait speed, especially when participating in cognitive dual  task. Pt reporting knee discomfort with STS activities, but able to decrease reliance on BUE support this date with increased seat height. Pt reliant on UE support for stair stepping activity, but  performed well. Pt will continue to benefit from skilled therapy to address remaining deficits in order to improve overall QoL and return to PLOF.       OBJECTIVE IMPAIRMENTS: Abnormal gait, decreased activity tolerance, decreased balance, decreased coordination, decreased knowledge of use of DME, decreased mobility, difficulty walking, decreased ROM, decreased strength, dizziness, and improper body mechanics.   ACTIVITY LIMITATIONS: carrying, lifting, bending, standing, squatting, transfers, bed mobility, bathing, and locomotion level  PARTICIPATION LIMITATIONS: meal prep, cleaning, laundry, medication management, driving, shopping, community activity, and yard work  PERSONAL FACTORS: Past/current experiences, Time since onset of injury/illness/exacerbation, and 1-2 comorbidities:   are also affecting patient's functional outcome.   REHAB POTENTIAL: Good  CLINICAL DECISION MAKING: Evolving/moderate complexity  EVALUATION COMPLEXITY: Moderate  PLAN:  PT FREQUENCY: 2x/week  PT DURATION: 12 weeks  PLANNED INTERVENTIONS: 97110-Therapeutic exercises, 97530- Therapeutic activity, 97112- Neuromuscular re-education, 97535- Self Care, [id number]- Manual therapy, [phone]- Gait training, Patient/Family education, Joint mobilization, and DME instructions  PLAN FOR NEXT SESSION:  Update HEP Gait with dual tasking (conversing), add head turns B HHA for continued increased gait speed Increase ambulation distance to improve endurance, decreased rest breaks Continue sit to stand for BLE strengthening   Chiquita Silvan, SPT Physical Therapy Student - [facility]  [facility]  06/07/24, 4:57 PM

## 2024-06-09 NOTE — Therapy (Signed)
 OUTPATIENT OCCUPATIONAL THERAPY ORTHO TREATMENT  Patient Name: Stacey Ball MRN: [id number] DOB:May 06, 1961, 63 y.o., female Today's Date: 05/07/2024  PCP: Dr. Alda Helling REFERRING PROVIDER: Dr. Alda Helling  END OF SESSION: OT End of Session - 06/07/24      Visit Number 9    Number of Visits 24     Date for OT Re-Evaluation 07/30/24     Progress Note Due on Visit 10     OT Start Time 1145     OT Stop Time 1230     OT Time Calculation (min) 45 min     Equipment Utilized During Treatment walker     Activity Tolerance Patient tolerated treatment well     Behavior During Therapy WFL for tasks assessed/performed     Past Medical History:  Diagnosis Date   Anemia    HX of   Arthritis    Bipolar 1 disorder (HCC)    Cancer (HCC)    skin cancer   Cataracts, bilateral    Diabetes mellitus without complication (HCC)    Dyspnea    on exertion (Pt states, out of shape)   Function kidney decreased    High cholesterol    Hypertension    Hypothyroidism    Psoriasis    Psoriatic arthritis (HCC)    Thyroid  disease    Past Surgical History:  Procedure Laterality Date   CATARACT EXTRACTION W/PHACO Left 01/10/2020   Procedure: CATARACT EXTRACTION PHACO AND INTRAOCULAR LENS PLACEMENT (IOC) LEFT DIABETIC;  Surgeon: Ferol Rogue, MD;  Location: [facility];  Service: Ophthalmology;  Laterality: Left;  5.32 0:41.5   CATARACT EXTRACTION W/PHACO Right 02/07/2020   Procedure: CATARACT EXTRACTION PHACO AND INTRAOCULAR LENS PLACEMENT (IOC) RIGHT DIABETIC VISION BLUE 4.01 00:27.4;  Surgeon: Ferol Rogue, MD;  Location: [facility];  Service: Ophthalmology;  Laterality: Right;  Diabetic - oral meds   COLONOSCOPY WITH PROPOFOL  N/A 03/04/2016   Procedure: COLONOSCOPY WITH PROPOFOL ;  Surgeon: Lamar ONEIDA Holmes, MD;  Location: [facility];  Service: Endoscopy;  Laterality: N/A;   great toe amputation     matrixectomy     TONSILLECTOMY     Patient Active Problem  List   Diagnosis Date Noted   Hypothyroidism 03/26/2022   UTI (urinary tract infection) 03/26/2022   Pneumonia due to COVID-19 virus 10/22/2020   Bipolar disorder (HCC) 10/22/2020   Acute encephalopathy 10/22/2020   Benign hypertensive kidney disease with chronic kidney disease 07/11/2019   Secondary hyperparathyroidism of renal origin (HCC) 07/11/2019   Class 1 obesity due to excess calories with serious comorbidity and body mass index (BMI) of 32.0 to 32.9 in adult 05/09/2019   Type 2 diabetes mellitus with stage 3 chronic kidney disease, without long-term current use of insulin  (HCC) 09/16/2017   Stage 3 chronic kidney disease (HCC) 10/04/2016   Essential hypertriglyceridemia 03/22/2016   Essential hypertension 03/17/2016   ONSET DATE: 03/26/22  REFERRING DIAG: M25.511,G89.29 (ICD-10-CM) - Chronic right shoulder pain   THERAPY DIAG:  Muscle weakness (generalized)  Other lack of coordination  Chronic right shoulder pain  Rationale for Evaluation and Treatment: Rehabilitation  SUBJECTIVE:  SUBJECTIVE STATEMENT: Pt reports that she'd really like to be able to put her own hair up in her barrett, and reports her husband currently helps her with this task.   Pt accompanied by: self  PERTINENT HISTORY:  Pt returns today to restart therapy for her R shoulder. Pt was seen by this therapist in 2023 post R proximal humerus fx, non-surgical.  Pt attended 4 OT visits in 2023, but then self discharged, though she doesn't recall her reasoning for stopping. Pt did admit that she was stubborn, and should have kept going, as she remains quite limited with her shoulder ROM. Pt also currently being seen by outpatient PT  to address balance/mobility deficits d/t hx of falling.  Per chart on 03/26/22, 63 y/o old female with PMH significant for bipolar disorder, anemia, cataracts, hyperlipidemia, hypertension, hypothyroidism, obesity, type 2 diabetes, CKD stage IIIb presented in the ED with c/o:  generalized  weakness and recurrent falls. Patient has generalized weakness for some time which has gotten worse.  She had a fall yesterday and was evaluated outpatient and found to have right humeral fracture and was placed in a sling.  Patient had additional fall yesterday, denies any loss of consciousness or head trauma or head injury. In the ED work-up revealed UA positive for UTI.  Lactic acid normal.  x-ray right shoulder shows humeral neck fracture. ED physician spoke with orthopedics to discuss need for urgent evaluation.  Patient was admitted for generalized weakness secondary to UTI,  started on IV antibiotics.  Orthopedics consulted,  recommended conservative management. Patient is not a candidate for surgical intervention.  Advised outpatient follow-up.  Patient completed antibiotics for 3 days. PT recommended  skilled nursing facility for rehab.  Patient feels better and want to be discharged.  Patient being discharged skilled nursing facility for rehab.   PRECAUTIONS: Fall  RED FLAGS: None   WEIGHT BEARING RESTRICTIONS: No  PAIN: 06/07/24: No pain reported 0/10 pain at rest or activity in R shoulder, but does report pain in bilat knees and low back  Are you having pain? Yes: NPRS scale: 6/10  Pain location: low back and bilat knees Pain description: achy Aggravating factors: prolonged standing/walking Relieving factors: rest, Tylenol   FALLS: Has patient fallen in last 6 months? Yes. Number of falls pt unsure, but reports 6 falls, but within more than 6 months   LIVING ENVIRONMENT: Lives with: lives with their spouse Lives in: first floor apt  Stairs: No Has following equipment at home: Counselling psychologist, Environmental consultant - 2 wheeled, Environmental consultant - 4 wheeled, Wheelchair (manual), Shower bench, bed side commode, and Grab bars, bed rail   PLOF:  Independent with basic ADLs, spouse and pt shared IADL responsibilities prior to R shoulder fx in 2023  PATIENT GOALS: I would like to get better  movement in my shoulder.   NEXT MD VISIT: Pt unsure, Not for awhile.  OBJECTIVE:  Note: Objective measures were completed at Evaluation unless otherwise noted.  HAND DOMINANCE: Right  ADLs: Overall ADLs: spouse provides assist as needed.  Pt reports, He's doing a lot now.  Transfers/ambulation related to ADLs: supv-modified indep with AD Eating: indep  Grooming: uses L non-dominant hand to brush hair; able to use R dominant hand to brush teeth with electric toothbrush Upper body dressing: set up-modified indep with donning sports bra and shirt overhead  Lower body dressing: occasional help with clothing set up, extra time and effort to don socks, slip on shoes, indep with panties and pants  Toileting: 3in1 over toilet, uses L hand at baseline for pericare; has tried a toilet wand in the past, but minimally effective  Bathing: spouse helps to wash hair and back, spouse helps to wash buttocks Tub shower transfers: supv-min A for lifting legs over tub shower with pt using transfer tub bench and grab bar  Equipment: see above  UPPER EXTREMITY ROM:  Active ROM Right eval Left Eval WNL throughout  Shoulder flexion 30 (155)   Shoulder abduction 51 (153)   Shoulder adduction    Shoulder extension    Shoulder internal rotation ~thumb to L4-5    Shoulder external rotation 5 (90) arm held in 90 abd by OT   Elbow flexion    Elbow extension    Wrist flexion    Wrist extension    Wrist ulnar deviation    Wrist radial deviation    Wrist pronation    Wrist supination    (Blank rows = not tested)  UPPER EXTREMITY MMT:     MMT Right eval Left eval  Shoulder flexion 2 4+  Shoulder abduction 2 4+  Shoulder adduction    Shoulder extension    Shoulder internal rotation 4- 4+  Shoulder external rotation 2 4-  Middle trapezius    Lower trapezius    Elbow flexion 4 4+  Elbow extension 5 4+  Wrist flexion 5 4+  Wrist extension 5 4+  Wrist ulnar deviation    Wrist radial  deviation    Wrist pronation    Wrist supination    (Blank rows = not tested)  HAND FUNCTION: Grip strength: Right: 37 lbs; Left: 37 lbs, Lateral pinch: Right: 13 lbs, Left: 9 lbs, and 3 point pinch: Right: 9 lbs, Left: 6 lbs  COORDINATION: NT at eval d/t time constraints; will test in upcoming sessions if indicated  05/10/24: 9 hole peg test L 36, R 40  SENSATION:  WFL  EDEMA: No visible edema  COGNITION: Overall cognitive status: History of cognitive impairments - at baseline, decreased memory, hx of bipolar disorder   OBSERVATIONS:  Pt pleasant, cooperative, verbalizes eagerness to improve R shoulder mobility.  TREATMENT DATE: 06/07/24  Therapeutic Exercise: -Continued focus on HEP for RUE strengthening, including yellow theraband and AROM without resistance - With yellow theraband, pt completed seated row, chest press, IR with elbow at side (OT assist to anchor band with IR), R elbow flexion x2 sets 10 reps; min vc for techniques to anchor band using L hand or foot for above noted exercises. -RUE AROM (no theraband) for shoulder flex and abd using pt's L hand crossing over chest to depress R shoulder; instructed to minimize ROM arc to decrease R shoulder hike and elbow flexion.  Pt cued to reach forward past her knees to initiate shoulder flexion. ER with elbows at sides, requiring mod vc and demo.  Self Care: -Hair care: mod vc and demo for compensatory strategies, with practice propping R arm up on table top to proximally support RUE.  Pt able to tuck chin and manage barrett on L side of head bimanually with R arm propped on table top.  Reviewed positioning options at home in bathroom or kitchen countertop, sitting/vs standing, for optimal reach. -Reviewed poc and encouragement to seek additional imaging for R shoulder chronic soft tissue damage if pt feels like she may want to seek other options for improving function after she discharges from therapy (if conservative measures do  not allow her to meet her goals).  Pt verbalized understanding.  PATIENT EDUCATION: Education details: HEP Person educated: Patient Education method: Explanation, vc, tactile cues, written handout Education comprehension: verbalized understanding, demonstrated understanding with multimodal cueing; further training needed  HOME EXERCISE PROGRAM: Access Code: [email or web address] URL: [email or web address] Date: 05/28/2024 Prepared by: Inocente Blazing  Exercises - Seated Shoulder Row with Resistance Anchored at Feet  - 1 x daily - 3-5 x weekly - 2-3 sets - 10 reps - Seated Chest Press with Resistance Band  - 1 x daily - 3-5 x weekly - 2-3 sets - 10 reps - Seated Elbow Flexion with Self-Anchored Resistance  - 1 x daily - 3-5 x weekly - 2-3 sets - 10 reps - Standing Shoulder Flexion to 90 Degrees  - 1 x daily - 3-5 x weekly - 2-3 sets - 10 reps - Shoulder Abduction - Thumbs Up  - 1 x daily - 3-5 x weekly - 2-3 sets - 10 reps - Shoulder External Rotation and Scapular Retraction with Resistance  - 1 x daily - 3-5 x weekly - 2-3 sets - 10 reps - Standing Shoulder Internal Rotation with Anchored Resistance  - 1 x daily - 7 x weekly - 3 sets - 10 reps   AAROM  for shoulder flexion, and abduction at the tabletop surface.  GOALS: Goals reviewed with patient? Yes  SHORT TERM GOALS: Target date: 06/18/24  Pt will be indep with HEP for increasing strength and ROM in the R shoulder. Baseline: Eval: To be initiated in upcoming sessions Goal status: INITIAL  2.  Pt will identify and implement 2-3 activity modifications/AE in order to compensate for RUE weakness in order to maximize indep with BADLs.  Baseline: Eval: Educ not yet initiated  Goal status: INITIAL  LONG TERM GOALS: Target date: 07/30/24  1.  Pt will increase R active shoulder flexion and abd to 70 degrees or better to better engage  the RUE into BADLs. Baseline: Eval: R shoulder flex 30, abd 51 Goal status: INITIAL   2.  Pt will increase active R shoulder ER to 30 degrees or better to enable brushing/combing R side of head with R dominant hand and arm propped as needed. Baseline: Eval: Pt brushes hair with L non-dominant arm; active R shoulder ER 5* with arm propped into abd  Goal status: INITIAL  3.  Pt will increase active R shoulder IR for increased thoroughness with posterior LB bathing.  Baseline: Eval: ~thumb to L4-5; pt reports spouse has to help with posterior in shower  Goal status: INITIAL  3. Pt will increase R shoulder strength by 1/2 muscle grade or more in order to better engage the RUE into BADLs.  Baseline: Eval: R shoulder grossly 2/5  Goal status: INITIAL  ASSESSMENT: CLINICAL IMPRESSION: Pt able to place hair in barrett with use of compensatory positioning today.  Pt reports that spouse has to assist her with hair care at home d/t pt's limited shoulder mobility, but agreed to practice hair care at home by propping R arm up on table top to support R shoulder.  Pt was able to increase indep with R shoulder flex and abd portion of HEP today by using L hand to depress R shoulder.  Will continue to review Hep to maximize indep with form and technique, but pt is showing improved awareness of substitution patterns.  Pt. continues to benefit from skilled OT services to maximize safety and independence in necessary daily tasks.     PERFORMANCE DEFICITS: in functional skills including ADLs, IADLs, coordination, ROM, strength, pain, flexibility,  Gross motor control, mobility, balance, body mechanics, decreased knowledge of precautions, decreased knowledge of use of DME, and UE functional use, cognitive skills including memory, and psychosocial skills including coping strategies, environmental adaptation, habits, and routines and behaviors.   IMPAIRMENTS: are limiting patient from ADLs, IADLs, and leisure.    COMORBIDITIES: has co-morbidities such as bipolar disorder, CKD, HTN, DM2, arthritis that affects occupational performance. Patient will benefit from skilled OT to address above impairments and improve overall function.  MODIFICATION OR ASSISTANCE TO COMPLETE EVALUATION: Min-Moderate modification of tasks or assist with assess necessary to complete an evaluation.  OT OCCUPATIONAL PROFILE AND HISTORY: Detailed assessment: Review of records and additional review of physical, cognitive, psychosocial history related to current functional performance.  CLINICAL DECISION MAKING: Moderate - several treatment options, min-mod task modification necessary  REHAB POTENTIAL: Fair based on limited participation last episode of OT in 2023  EVALUATION COMPLEXITY: Moderate  PLAN:  OT FREQUENCY: 2x/week  OT DURATION: 12 weeks  PLANNED INTERVENTIONS: 97168 OT Re-evaluation, 97535 self care/ADL training, 02889 therapeutic exercise, 97530 therapeutic activity, 97140 manual therapy, 97010 moist heat, 97010 cryotherapy, 97750 Physical Performance Testing, passive range of motion, psychosocial skills training, energy conservation, coping strategies training, patient/family education, and DME and/or AE instructions  RECOMMENDED OTHER SERVICES: None at this time (Pt currently seeing PT for mobility deficits)  CONSULTED AND AGREED WITH PLAN OF CARE: Patient  PLAN FOR NEXT SESSION: see above   Inocente Blazing, MS, OTR/L  06/09/2024, 9:15 PM

## 2024-06-11 ENCOUNTER — Ambulatory Visit: Admitting: Physical Therapy

## 2024-06-11 ENCOUNTER — Ambulatory Visit: Admitting: Occupational Therapy

## 2024-06-13 ENCOUNTER — Ambulatory Visit: Admitting: Physical Therapy

## 2024-06-13 ENCOUNTER — Ambulatory Visit

## 2024-06-13 DIAGNOSIS — M6281 Muscle weakness (generalized): Secondary | ICD-10-CM

## 2024-06-13 DIAGNOSIS — M25611 Stiffness of right shoulder, not elsewhere classified: Secondary | ICD-10-CM

## 2024-06-13 DIAGNOSIS — R278 Other lack of coordination: Secondary | ICD-10-CM

## 2024-06-13 DIAGNOSIS — R2681 Unsteadiness on feet: Secondary | ICD-10-CM

## 2024-06-13 DIAGNOSIS — R262 Difficulty in walking, not elsewhere classified: Secondary | ICD-10-CM

## 2024-06-13 DIAGNOSIS — R2689 Other abnormalities of gait and mobility: Secondary | ICD-10-CM

## 2024-06-13 NOTE — Therapy (Signed)
 OUTPATIENT PHYSICAL THERAPY TREATMENT   Patient Name: Stacey Ball MRN: [id number] DOB:[birth year], 63 y.o., female Today's Date: 06/13/2024  PCP: Alla Amis MD REFERRING PROVIDER: Jonette, Minor  END OF SESSION:    PT End of Session - 06/13/24 1452     Visit Number 13    Number of Visits 24    Date for PT Re-Evaluation 07/19/24    Progress Note Due on Visit 10    PT Start Time 1450    PT Stop Time 1532    PT Time Calculation (min) 42 min    Equipment Utilized During Treatment Gait belt    Activity Tolerance Patient tolerated treatment well    Behavior During Therapy WFL for tasks assessed/performed           Past Medical History:  Diagnosis Date   Anemia    HX of   Arthritis    Bipolar 1 disorder (HCC)    Cancer (HCC)    skin cancer   Cataracts, bilateral    Diabetes mellitus without complication (HCC)    Dyspnea    on exertion (Pt states, out of shape)   Function kidney decreased    High cholesterol    Hypertension    Hypothyroidism    Psoriasis    Psoriatic arthritis (HCC)    Thyroid  disease    Past Surgical History:  Procedure Laterality Date   CATARACT EXTRACTION W/PHACO Left 01/10/2020   Procedure: CATARACT EXTRACTION PHACO AND INTRAOCULAR LENS PLACEMENT (IOC) LEFT DIABETIC;  Surgeon: Ferol Rogue, MD;  Location: [facility];  Service: Ophthalmology;  Laterality: Left;  5.32 0:41.5   CATARACT EXTRACTION W/PHACO Right 02/07/2020   Procedure: CATARACT EXTRACTION PHACO AND INTRAOCULAR LENS PLACEMENT (IOC) RIGHT DIABETIC VISION BLUE 4.01 00:27.4;  Surgeon: Ferol Rogue, MD;  Location: [facility];  Service: Ophthalmology;  Laterality: Right;  Diabetic - oral meds   COLONOSCOPY WITH PROPOFOL  N/A 03/04/2016   Procedure: COLONOSCOPY WITH PROPOFOL ;  Surgeon: Lamar ONEIDA Holmes, MD;  Location[facility];  Service: Endoscopy;  Laterality: N/A;   great toe amputation     matrixectomy     TONSILLECTOMY     Patient Active Problem List    Diagnosis Date Noted   Hypothyroidism 03/26/2022   UTI (urinary tract infection) 03/26/2022   Pneumonia due to COVID-19 virus 10/22/2020   Bipolar disorder (HCC) 10/22/2020   Acute encephalopathy 10/22/2020   Benign hypertensive kidney disease with chronic kidney disease 07/11/2019   Secondary hyperparathyroidism of renal origin (HCC) 07/11/2019   Class 1 obesity due to excess calories with serious comorbidity and body mass index (BMI) of 32.0 to 32.9 in adult 05/09/2019   Type 2 diabetes mellitus with stage 3 chronic kidney disease, without long-term current use of insulin  (HCC) 09/16/2017   Stage 3 chronic kidney disease (HCC) 10/04/2016   Essential hypertriglyceridemia 03/22/2016   Essential hypertension 03/17/2016   ONSET DATE: 2 years ago  REFERRING DIAG: Chronic right shoulder pain [M25.511, G89.29], Leg weakness, bilateral [R29.898]   THERAPY DIAG:   Muscle weakness (generalized)  Difficulty in walking, not elsewhere classified  Other lack of coordination  Other abnormalities of gait and mobility  Unsteadiness on feet  Rationale for Evaluation and Treatment: Rehabilitation  SUBJECTIVE:  SUBJECTIVE STATEMENT:   Pt states that she is currently getting over a cold. Pt states that her knees are doing okay at the moment; pain currently only in the shoulder. Pt states that she has done her exercises but not as much d/t being sick. Pt reported sometimes I will have a close call.. I take chances and they don't really work out well for me. However, no specific instance of fall or near LOB noted.    PERTINENT HISTORY:  Pt presents to clinic today in transport chair. Pt has had consistent falls for the last 2 years, but she reports not having fallen in the last 6 months. Pt uses a 4WW at home c  the brakes constantly on to avoid the rollator rolling outside her BOS. Pt stated she does not like 2WW as she fell over one when attempting to rush to the toilet one night. Pt states she has dizzy spells, particularly when coming from a sit to stand or with head turns during gait. Pt also stated her dizziness has tapered off recently as well. She reports all these started around when she was hospitalized for covid and PNA 2 years ago. Pt has a bed rail in her bed. Installed it after falling out of bed multiple times. Pt has very limited shoulder ROM. She reports her husband helps around a lot at home with ADL and IADL like washing her hair. However, she feels his help is prohibiting her from getting better and requested us  to explain to him that she needs to attempt more tasks on her own.  PAIN:  Are you having pain? No pain while sitting, exercising, just with walking.   PRECAUTIONS: Fall  WEIGHT BEARING RESTRICTIONS: No  FALLS: Has patient fallen in last 6 months? No  LIVING ENVIRONMENT: Lives with: lives with their spouse Lives in: House/apartment Stairs: No Has following equipment at home: Single point cane, Environmental consultant - 2 wheeled, and Environmental consultant - 4 wheeled  PLOF: Independent  PATIENT GOALS: Would like to walk without AD, Work up strength in L and R arm.   OBJECTIVE:  Note: Objective measures were completed at evaluation unless otherwise noted.                                                                                                                             TREATMENT DATE 06/13/24:    Pt arrives to session in transport chair from OT.   Unless otherwise stated, CGA was provided and gait belt donned in order to ensure pt safety throughout session.  Patient educated on POC for today's session to continue to promote increased endurance via limiting rest breaks as appropriate & tolerated. Patient agreeable to plan.   Gait Training: Gait training use of 1 person HHA L hand, 300' with  verbal cueing for increasing step length & increased reciprocal gait pattern. Pt requiring 2 min. seated rest break following activity. RPE: 9/20.   Gait training 150' 1 person L  HHA verbal cueing for increased step length, continued reciprocal gait pattern, & looking up at surroundings. RPE: 9/20.   Of note, increased foot slap & decreased foot clearance noted on L foot, potentially secondary to glute weakness R>L.   2x5 step ups with BUE support at handrails on first step @ stairs; to promote SLS & functional strength.  First set, leading with LLE. Second set, leading with RLE.  Significant glute weakness noted bilaterally; delayed hip extension.   Pt ambulating to restroom 1 person L HHA from steps to restroom. In restroom, pt navigating environment with RW independently. Pt returning to transport chair utilizing RW SBA.   Pt left session via transport chair.        PATIENT EDUCATION:  Education details: POC  Person educated: Patient  Education method: Explanation  Education comprehension: verbalized understanding  HOME EXERCISE PROGRAM: Access Code: [email or web address] URL: [email or web address] Date: 05/17/2024 Prepared by: Peggye Linear  Exercises - Sit to Stand with Counter Support  - 1 x daily - 2 sets - 10 reps - Standing March with Counter Support  - 1 x daily - 3 sets - 12 reps - Seated Scapular Retraction  - 1 x daily - 3 sets - 12 reps - Heel Raises with Counter Support  - 1 x daily - 3 sets - 10 reps   GOALS: Goals reviewed with patient? Yes  SHORT TERM GOALS: Target date: 07/19/2024  Consistently perform HEP at least 4x/week to maintain progress made in PT to improve function and overall QoL. Baseline: Initiated on 04/30/2024 05/28/24: updated on 05/17/24 Goal status: IN PROGRESS   LONG TERM GOALS: Target date: 07/19/2024  Pt will decrease her STS time by 10 seconds to improve power and strength which will increase QoL.  Baseline: 33.69 sec B UE  support 05/28/24: 29.18 sec w/ BUE support at armrests  38.34 w/ BUEs at knees Goal status: IN PROGRESS   2.  Pt will decrease her TUG time by 10 seconds to reduce risk of falls and increase QoL.  Baseline: 42.64 sec with rollator 05/28/24: 29.37 sec with RW Goal status: MET & continue   3.  Pt will decrease time by 0.25 m/s in order to increase gait speed to reduce fall risk. Baseline: 04/30/2024: 0.35 m/s using her personal rollator with brakes locked and close SBA/CGA for safety 05/28/24: Average Normal speed: 0.45 m/s with RW, CGA  Goal status: IN PROGRESS   4.  Pt will increase distance walked during to aid with community ambulation and reduce fall risk. Baseline: 04/30/2024: 103 feet, had to stop at 50min40seconds due to fatigue, (31.39 meters, Avg speed 0.51m/s) using her personal rollator with brakes locked and CGA for safety 05/28/24: 206 ft. 62.78 meters, Avg speed 0.73m/s) using a RW with skilled CGA.  Goal status: IN PROGRESS   5.  Pt will increase ABC score to 80% to increase her confidence with daily activities and improve overall QoL. Baseline: 30% 05/28/24: 54.375%  Goal status: MET & upgraded on 8/11 from 50% > 80%    ASSESSMENT:  CLINICAL IMPRESSION:    Pt able to progress to 1 person L HHA this date for gait interventions. Pt limited in ambulation distance this date secondary to fatigue from getting over sickness, however performed well.  Pt continuing to require verbal cueing throughout gait trials for increased & even step length, gait speed, especially when participating in cognitive dual task. Pt continues to be reliant on UE support for stair stepping activity, but  performed well. Pt will continue to benefit from skilled therapy to address remaining deficits in order to improve overall QoL and return to PLOF.       OBJECTIVE IMPAIRMENTS: Abnormal gait, decreased activity tolerance, decreased balance, decreased coordination, decreased knowledge of use  of DME, decreased mobility, difficulty walking, decreased ROM, decreased strength, dizziness, and improper body mechanics.   ACTIVITY LIMITATIONS: carrying, lifting, bending, standing, squatting, transfers, bed mobility, bathing, and locomotion level  PARTICIPATION LIMITATIONS: meal prep, cleaning, laundry, medication management, driving, shopping, community activity, and yard work  PERSONAL FACTORS: Past/current experiences, Time since onset of injury/illness/exacerbation, and 1-2 comorbidities:   are also affecting patient's functional outcome.   REHAB POTENTIAL: Good  CLINICAL DECISION MAKING: Evolving/moderate complexity  EVALUATION COMPLEXITY: Moderate  PLAN:  PT FREQUENCY: 2x/week  PT DURATION: 12 weeks  PLANNED INTERVENTIONS: 97110-Therapeutic exercises, 97530- Therapeutic activity, 97112- Neuromuscular re-education, 97535- Self Care, [id number]- Manual therapy, [phone]- Gait training, Patient/Family education, Joint mobilization, and DME instructions  PLAN FOR NEXT SESSION:   Update HEP to include additional functional LE strengthening Gait with dual tasking (conversing), add head turns* Continue with 1 person HHA for continued increased gait speed & improved gait mechanics Increase ambulation distance to improve endurance, decreased rest breaks Continue sit to stand for BLE strengthening; decreasing reliance on UEs   Chiquita Silvan, SPT Physical Therapy Student - [facility]  [facility]  06/13/24, 5:03 PM

## 2024-06-13 NOTE — Therapy (Unsigned)
 OUTPATIENT OCCUPATIONAL THERAPY ORTHO TREATMENT  Patient Name: Stacey Ball MRN: [id number] DOB:[birth year], 63 y.o., female Today's Date: 05/07/2024  PCP: Dr. Alda Helling REFERRING PROVIDER: Dr. Alda Helling  END OF SESSION:   OT End of Session - 06/13/24 1358     Visit Number 10    Number of Visits 24    Date for OT Re-Evaluation 07/30/24    Progress Note Due on Visit 10    OT Start Time 1400    OT Stop Time 1445    OT Time Calculation (min) 45 min    Equipment Utilized During Treatment transport chair    Activity Tolerance Patient tolerated treatment well    Behavior During Therapy WFL for tasks assessed/performed         Past Medical History:  Diagnosis Date   Anemia    HX of   Arthritis    Bipolar 1 disorder (HCC)    Cancer (HCC)    skin cancer   Cataracts, bilateral    Diabetes mellitus without complication (HCC)    Dyspnea    on exertion (Pt states, out of shape)   Function kidney decreased    High cholesterol    Hypertension    Hypothyroidism    Psoriasis    Psoriatic arthritis (HCC)    Thyroid  disease    Past Surgical History:  Procedure Laterality Date   CATARACT EXTRACTION W/PHACO Left 01/10/2020   Procedure: CATARACT EXTRACTION PHACO AND INTRAOCULAR LENS PLACEMENT (IOC) LEFT DIABETIC;  Surgeon: Ferol Rogue, MD;  Location: [facility];  Service: Ophthalmology;  Laterality: Left;  5.32 0:41.5   CATARACT EXTRACTION W/PHACO Right 02/07/2020   Procedure: CATARACT EXTRACTION PHACO AND INTRAOCULAR LENS PLACEMENT (IOC) RIGHT DIABETIC VISION BLUE 4.01 00:27.4;  Surgeon: Ferol Rogue, MD;  Location: [facility];  Service: Ophthalmology;  Laterality: Right;  Diabetic - oral meds   COLONOSCOPY WITH PROPOFOL  N/A 03/04/2016   Procedure: COLONOSCOPY WITH PROPOFOL ;  Surgeon: Lamar ONEIDA Holmes, MD;  Location: [facility];  Service: Endoscopy;  Laterality: N/A;   great toe amputation     matrixectomy     TONSILLECTOMY     Patient  Active Problem List   Diagnosis Date Noted   Hypothyroidism 03/26/2022   UTI (urinary tract infection) 03/26/2022   Pneumonia due to COVID-19 virus 10/22/2020   Bipolar disorder (HCC) 10/22/2020   Acute encephalopathy 10/22/2020   Benign hypertensive kidney disease with chronic kidney disease 07/11/2019   Secondary hyperparathyroidism of renal origin (HCC) 07/11/2019   Class 1 obesity due to excess calories with serious comorbidity and body mass index (BMI) of 32.0 to 32.9 in adult 05/09/2019   Type 2 diabetes mellitus with stage 3 chronic kidney disease, without long-term current use of insulin  (HCC) 09/16/2017   Stage 3 chronic kidney disease (HCC) 10/04/2016   Essential hypertriglyceridemia 03/22/2016   Essential hypertension 03/17/2016   ONSET DATE: 03/26/22  REFERRING DIAG: M25.511,G89.29 (ICD-10-CM) - Chronic right shoulder pain   THERAPY DIAG:  Muscle weakness (generalized)  Other lack of coordination  Chronic right shoulder pain  Rationale for Evaluation and Treatment: Rehabilitation  SUBJECTIVE:  SUBJECTIVE STATEMENT: Pt reports that she'd really like to be able to put her own hair up in her barrett, and reports her husband currently helps her with this task.   Pt accompanied by: self  PERTINENT HISTORY:  Pt returns today to restart therapy for her R shoulder. Pt was seen by this therapist in 2023 post R proximal humerus fx, non-surgical.  Pt  attended 4 OT visits in 2023, but then self discharged, though she doesn't recall her reasoning for stopping. Pt did admit that she was stubborn, and should have kept going, as she remains quite limited with her shoulder ROM. Pt also currently being seen by outpatient PT  to address balance/mobility deficits d/t hx of falling.  Per chart on 03/26/22, 63 y/o old female with PMH significant for bipolar disorder, anemia, cataracts, hyperlipidemia, hypertension, hypothyroidism, obesity, type 2 diabetes, CKD stage IIIb presented in the ED  with c/o: generalized  weakness and recurrent falls. Patient has generalized weakness for some time which has gotten worse.  She had a fall yesterday and was evaluated outpatient and found to have right humeral fracture and was placed in a sling.  Patient had additional fall yesterday, denies any loss of consciousness or head trauma or head injury. In the ED work-up revealed UA positive for UTI.  Lactic acid normal.  x-ray right shoulder shows humeral neck fracture. ED physician spoke with orthopedics to discuss need for urgent evaluation.  Patient was admitted for generalized weakness secondary to UTI,  started on IV antibiotics.  Orthopedics consulted,  recommended conservative management. Patient is not a candidate for surgical intervention.  Advised outpatient follow-up.  Patient completed antibiotics for 3 days. PT recommended  skilled nursing facility for rehab.  Patient feels better and want to be discharged.  Patient being discharged skilled nursing facility for rehab.   PRECAUTIONS: Fall  RED FLAGS: None   WEIGHT BEARING RESTRICTIONS: No  PAIN: 06/13/24: 5/10 pain R shoulder (pt reports leaning on her elbow when watching tv in the bedroom)  0/10 pain at rest or activity in R shoulder, but does report pain in bilat knees and low back  Are you having pain? Yes: NPRS scale: 6/10  Pain location: low back and bilat knees Pain description: achy Aggravating factors: prolonged standing/walking Relieving factors: rest, Tylenol   FALLS: Has patient fallen in last 6 months? Yes. Number of falls pt unsure, but reports 6 falls, but within more than 6 months   LIVING ENVIRONMENT: Lives with: lives with their spouse Lives in: first floor apt  Stairs: No Has following equipment at home: Counselling psychologist, Environmental consultant - 2 wheeled, Environmental consultant - 4 wheeled, Wheelchair (manual), Shower bench, bed side commode, and Grab bars, bed rail   PLOF:  Independent with basic ADLs, spouse and pt shared IADL  responsibilities prior to R shoulder fx in 2023  PATIENT GOALS: I would like to get better movement in my shoulder.   NEXT MD VISIT: Pt unsure, Not for awhile.  OBJECTIVE:  Note: Objective measures were completed at Evaluation unless otherwise noted.  HAND DOMINANCE: Right  ADLs: Overall ADLs: spouse provides assist as needed.  Pt reports, He's doing a lot now.  Transfers/ambulation related to ADLs: supv-modified indep with AD Eating: indep  Grooming: uses L non-dominant hand to brush hair; able to use R dominant hand to brush teeth with electric toothbrush Upper body dressing: set up-modified indep with donning sports bra and shirt overhead  Lower body dressing: occasional help with clothing set up, extra time and effort to don socks, slip on shoes, indep with panties and pants  Toileting: 3in1 over toilet, uses L hand at baseline for pericare; has tried a toilet wand in the past, but minimally effective  Bathing: spouse helps to wash hair and back, spouse helps to wash buttocks Tub shower transfers: supv-min A for lifting legs over tub shower with pt using transfer  tub bench and grab bar  Equipment: see above  UPPER EXTREMITY ROM:     Active ROM Right eval Right 06/13/24 Left Eval WNL throughout  Shoulder flexion 30 (155) 40 (155)   Shoulder abduction 51 (153) 55 (155)   Shoulder adduction     Shoulder extension     Shoulder internal rotation ~thumb to L4-5  ~thumb to L5 (middle)   Shoulder external rotation 5 (90) arm held in 90 abd by OT 5 (90)   Elbow flexion     Elbow extension     Wrist flexion     Wrist extension     Wrist ulnar deviation     Wrist radial deviation     Wrist pronation     Wrist supination     (Blank rows = not tested)  UPPER EXTREMITY MMT:     MMT Right eval Right 06/13/24 Left eval  Shoulder flexion 2 2 4+  Shoulder abduction 2 2 4+  Shoulder adduction     Shoulder extension     Shoulder internal rotation 4- 4 4+  Shoulder  external rotation 2 2 4-  Middle trapezius     Lower trapezius     Elbow flexion 4 4+ 4+  Elbow extension 5 5 4+  Wrist flexion 5 5 4+  Wrist extension 5 5 4+  Wrist ulnar deviation     Wrist radial deviation     Wrist pronation     Wrist supination     (Blank rows = not tested)  HAND FUNCTION: Grip strength: Right: 37 lbs; Left: 37 lbs, Lateral pinch: Right: 13 lbs, Left: 9 lbs, and 3 point pinch: Right: 9 lbs, Left: 6 lbs 06/13/24: Grip strength: Right: 35 lbs, Left: 35 lbs  COORDINATION: NT at eval d/t time constraints; will test in upcoming sessions if indicated  05/10/24: 9 hole peg test L 36, R 40  SENSATION:  WFL  EDEMA: No visible edema  COGNITION: Overall cognitive status: History of cognitive impairments - at baseline, decreased memory, hx of bipolar disorder   OBSERVATIONS:  Pt pleasant, cooperative, verbalizes eagerness to improve R shoulder mobility.  TREATMENT DATE: 06/13/24  Therapeutic Exercise: -Continued focus on HEP for RUE strengthening, including yellow theraband and AROM without resistance - With yellow theraband, pt completed seated row, chest press, IR with elbow at side (OT assist to anchor band with IR), R elbow flexion x2 sets 10 reps; min vc for techniques to anchor band using L hand or foot for above noted exercises. -RUE AROM (no theraband) for shoulder flex and abd using pt's L hand crossing over chest to depress R shoulder; instructed to minimize ROM arc to decrease R shoulder hike and elbow flexion.  Pt cued to reach forward past her knees to initiate shoulder flexion. ER with elbows at sides, requiring mod vc and demo.  Self Care: -Hair care: mod vc and demo for compensatory strategies, with practice propping R arm up on table top to proximally support RUE.  Pt able to tuck chin and manage barrett on L side of head bimanually with R arm propped on table top.  Reviewed positioning options at home in bathroom or kitchen countertop, sitting/vs  standing, for optimal reach. -Reviewed poc and encouragement to seek additional imaging for R shoulder chronic soft tissue damage if pt feels like she may want to seek other options for improving function after she discharges from therapy (if conservative measures do not allow her to meet her goals).  Pt verbalized  understanding.                                                                                                        PATIENT EDUCATION: Education details: HEP Person educated: Patient Education method: Explanation, vc, tactile cues, written handout Education comprehension: verbalized understanding, demonstrated understanding with multimodal cueing; further training needed  HOME EXERCISE PROGRAM: Access Code: [email or web address] URL: [email or web address] Date: 05/28/2024 Prepared by: Inocente Blazing  Exercises - Seated Shoulder Row with Resistance Anchored at Feet  - 1 x daily - 3-5 x weekly - 2-3 sets - 10 reps - Seated Chest Press with Resistance Band  - 1 x daily - 3-5 x weekly - 2-3 sets - 10 reps - Seated Elbow Flexion with Self-Anchored Resistance  - 1 x daily - 3-5 x weekly - 2-3 sets - 10 reps - Standing Shoulder Flexion to 90 Degrees  - 1 x daily - 3-5 x weekly - 2-3 sets - 10 reps - Shoulder Abduction - Thumbs Up  - 1 x daily - 3-5 x weekly - 2-3 sets - 10 reps - Shoulder External Rotation and Scapular Retraction with Resistance  - 1 x daily - 3-5 x weekly - 2-3 sets - 10 reps - Standing Shoulder Internal Rotation with Anchored Resistance  - 1 x daily - 7 x weekly - 3 sets - 10 reps   AAROM  for shoulder flexion, and abduction at the tabletop surface.  GOALS: Goals reviewed with patient? Yes  SHORT TERM GOALS: Target date: 06/18/24  Pt will be indep with HEP for increasing strength and ROM in the R shoulder. Baseline: Eval: To be initiated in upcoming sessions Goal status: INITIAL  2.  Pt will identify and implement 2-3 activity modifications/AE in order to  compensate for RUE weakness in order to maximize indep with BADLs.  Baseline: Eval: Educ not yet initiated  Goal status: INITIAL  LONG TERM GOALS: Target date: 07/30/24  1.  Pt will increase R active shoulder flexion and abd to 70 degrees or better to better engage the RUE into BADLs. Baseline: Eval: R shoulder flex 30, abd 51 Goal status: INITIAL   2.  Pt will increase active R shoulder ER to 30 degrees or better to enable brushing/combing R side of head with R dominant hand and arm propped as needed. Baseline: Eval: Pt brushes hair with L non-dominant arm; active R shoulder ER 5* with arm propped into abd  Goal status: INITIAL  3.  Pt will increase active R shoulder IR for increased thoroughness with posterior LB bathing.  Baseline: Eval: ~thumb to L4-5; pt reports spouse has to help with posterior in shower  Goal status: INITIAL  3. Pt will increase R shoulder strength by 1/2 muscle grade or more in order to better engage the RUE into BADLs.  Baseline: Eval: R shoulder grossly 2/5  Goal status: INITIAL  ASSESSMENT: CLINICAL IMPRESSION: Pt able to place hair in barrett with use of compensatory positioning today.  Pt reports that spouse has to assist her with hair care at home d/t pt's limited  shoulder mobility, but agreed to practice hair care at home by propping R arm up on table top to support R shoulder.  Pt was able to increase indep with R shoulder flex and abd portion of HEP today by using L hand to depress R shoulder.  Will continue to review Hep to maximize indep with form and technique, but pt is showing improved awareness of substitution patterns.  Pt. continues to benefit from skilled OT services to maximize safety and independence in necessary daily tasks.     PERFORMANCE DEFICITS: in functional skills including ADLs, IADLs, coordination, ROM, strength, pain, flexibility, Gross motor control, mobility, balance, body mechanics, decreased knowledge of precautions, decreased  knowledge of use of DME, and UE functional use, cognitive skills including memory, and psychosocial skills including coping strategies, environmental adaptation, habits, and routines and behaviors.   IMPAIRMENTS: are limiting patient from ADLs, IADLs, and leisure.   COMORBIDITIES: has co-morbidities such as bipolar disorder, CKD, HTN, DM2, arthritis that affects occupational performance. Patient will benefit from skilled OT to address above impairments and improve overall function.  MODIFICATION OR ASSISTANCE TO COMPLETE EVALUATION: Min-Moderate modification of tasks or assist with assess necessary to complete an evaluation.  OT OCCUPATIONAL PROFILE AND HISTORY: Detailed assessment: Review of records and additional review of physical, cognitive, psychosocial history related to current functional performance.  CLINICAL DECISION MAKING: Moderate - several treatment options, min-mod task modification necessary  REHAB POTENTIAL: Fair based on limited participation last episode of OT in 2023  EVALUATION COMPLEXITY: Moderate  PLAN:  OT FREQUENCY: 2x/week  OT DURATION: 12 weeks  PLANNED INTERVENTIONS: 97168 OT Re-evaluation, 97535 self care/ADL training, 02889 therapeutic exercise, 97530 therapeutic activity, 97140 manual therapy, 97010 moist heat, 97010 cryotherapy, 97750 Physical Performance Testing, passive range of motion, psychosocial skills training, energy conservation, coping strategies training, patient/family education, and DME and/or AE instructions  RECOMMENDED OTHER SERVICES: None at this time (Pt currently seeing PT for mobility deficits)  CONSULTED AND AGREED WITH PLAN OF CARE: Patient  PLAN FOR NEXT SESSION: see above   Inocente Blazing, MS, OTR/L  06/13/2024, 1:59 PM

## 2024-06-19 ENCOUNTER — Ambulatory Visit

## 2024-06-19 DIAGNOSIS — R2681 Unsteadiness on feet: Secondary | ICD-10-CM | POA: Diagnosis not present

## 2024-06-19 DIAGNOSIS — M6281 Muscle weakness (generalized): Secondary | ICD-10-CM | POA: Diagnosis not present

## 2024-06-19 DIAGNOSIS — R278 Other lack of coordination: Secondary | ICD-10-CM | POA: Insufficient documentation

## 2024-06-19 DIAGNOSIS — M25611 Stiffness of right shoulder, not elsewhere classified: Secondary | ICD-10-CM | POA: Insufficient documentation

## 2024-06-19 DIAGNOSIS — G8929 Other chronic pain: Secondary | ICD-10-CM | POA: Insufficient documentation

## 2024-06-19 DIAGNOSIS — M25511 Pain in right shoulder: Secondary | ICD-10-CM | POA: Diagnosis not present

## 2024-06-19 DIAGNOSIS — R2689 Other abnormalities of gait and mobility: Secondary | ICD-10-CM | POA: Diagnosis not present

## 2024-06-19 DIAGNOSIS — R262 Difficulty in walking, not elsewhere classified: Secondary | ICD-10-CM | POA: Diagnosis not present

## 2024-06-19 NOTE — Therapy (Signed)
 OUTPATIENT PHYSICAL THERAPY TREATMENT  Patient Name: Stacey Ball MRN: [id number] DOB:Jan 11, 1961, 63 y.o., female Today's Date: 06/19/2024  PCP: Alla Amis MD REFERRING PROVIDER: Jonette, Minor  END OF SESSION:    PT End of Session - 06/19/24 1322     Visit Number 14    Number of Visits 24    Date for PT Re-Evaluation 07/19/24    Authorization Type HTA PPO    Progress Note Due on Visit 10    PT Start Time 1315    PT Stop Time 1355    PT Time Calculation (min) 40 min    Equipment Utilized During Treatment Gait belt    Activity Tolerance Patient tolerated treatment well;Patient limited by fatigue    Behavior During Therapy WFL for tasks assessed/performed           Past Medical History:  Diagnosis Date   Anemia    HX of   Arthritis    Bipolar 1 disorder (HCC)    Cancer (HCC)    skin cancer   Cataracts, bilateral    Diabetes mellitus without complication (HCC)    Dyspnea    on exertion (Pt states, out of shape)   Function kidney decreased    High cholesterol    Hypertension    Hypothyroidism    Psoriasis    Psoriatic arthritis (HCC)    Thyroid  disease    Past Surgical History:  Procedure Laterality Date   CATARACT EXTRACTION W/PHACO Left 01/10/2020   Procedure: CATARACT EXTRACTION PHACO AND INTRAOCULAR LENS PLACEMENT (IOC) LEFT DIABETIC;  Surgeon: Ferol Rogue, MD;  Location: [facility];  Service: Ophthalmology;  Laterality: Left;  5.32 0:41.5   CATARACT EXTRACTION W/PHACO Right 02/07/2020   Procedure: CATARACT EXTRACTION PHACO AND INTRAOCULAR LENS PLACEMENT (IOC) RIGHT DIABETIC VISION BLUE 4.01 00:27.4;  Surgeon: Ferol Rogue, MD;  Location: [facility];  Service: Ophthalmology;  Laterality: Right;  Diabetic - oral meds   COLONOSCOPY WITH PROPOFOL  N/A 03/04/2016   Procedure: COLONOSCOPY WITH PROPOFOL ;  Surgeon: Lamar ONEIDA Holmes, MD;  Location: [facility] ENDOSCOPY;  Service: Endoscopy;  Laterality: N/A;   great toe amputation      matrixectomy     TONSILLECTOMY     Patient Active Problem List   Diagnosis Date Noted   Hypothyroidism 03/26/2022   UTI (urinary tract infection) 03/26/2022   Pneumonia due to COVID-19 virus 10/22/2020   Bipolar disorder (HCC) 10/22/2020   Acute encephalopathy 10/22/2020   Benign hypertensive kidney disease with chronic kidney disease 07/11/2019   Secondary hyperparathyroidism of renal origin (HCC) 07/11/2019   Class 1 obesity due to excess calories with serious comorbidity and body mass index (BMI) of 32.0 to 32.9 in adult 05/09/2019   Type 2 diabetes mellitus with stage 3 chronic kidney disease, without long-term current use of insulin  (HCC) 09/16/2017   Stage 3 chronic kidney disease (HCC) 10/04/2016   Essential hypertriglyceridemia 03/22/2016   Essential hypertension 03/17/2016   ONSET DATE: 2 years ago  REFERRING DIAG: Chronic right shoulder pain [M25.511, G89.29], Leg weakness, bilateral [R29.898]   THERAPY DIAG:   Muscle weakness (generalized)  Other lack of coordination  Stiffness of right shoulder, not elsewhere classified  Difficulty in walking, not elsewhere classified  Other abnormalities of gait and mobility  Unsteadiness on feet  Rationale for Evaluation and Treatment: Rehabilitation  SUBJECTIVE:  SUBJECTIVE STATEMENT:    Pt doing fine today- no updates from weekend. She has a birthday coming up tomorrow.   PERTINENT HISTORY:  Pt presents to clinic today in transport chair. Pt has had consistent falls for the last 2 years, but she reports not having fallen in the last 6 months. Pt uses a 4WW at home c the brakes constantly on to avoid the rollator rolling outside her BOS. Pt stated she does not like 2WW as she fell over one when attempting to rush to the toilet one night. Pt  states she has dizzy spells, particularly when coming from a sit to stand or with head turns during gait. Pt also stated her dizziness has tapered off recently as well. She reports all these started around when she was hospitalized for covid and PNA 2 years ago. Pt has a bed rail in her bed. Installed it after falling out of bed multiple times. Pt has very limited shoulder ROM. She reports her husband helps around a lot at home with ADL and IADL like washing her hair. However, she feels his help is prohibiting her from getting better and requested us  to explain to him that she needs to attempt more tasks on her own.  PAIN:  Are you having pain? No   PRECAUTIONS: Fall  WEIGHT BEARING RESTRICTIONS: No  FALLS: Has patient fallen in last 6 months? No  LIVING ENVIRONMENT: Lives with: lives with their spouse Lives in: House/apartment Stairs: No Has following equipment at home: Single point cane, Environmental consultant - 2 wheeled, and Environmental consultant - 4 wheeled  PLOF: Independent  PATIENT GOALS: Would like to walk without AD, Work up strength in L and R arm.   OBJECTIVE:  Note: Objective measures were completed at evaluation unless otherwise noted.                                                                                                                             TREATMENT DATE 06/19/24:    -alternating forward/backward AMB in // bars 8x each, repeated cues for task instruction)  -STS from chair x10  -alternate lateral stepping in // bars 8x, hands ad lib -STS from chair x10 (knee pain)  -forward 6 step up, 3 step up foam, step down turn and repeat in // bars  -forward walking over 6 step up, foam step up, falls pad, turn around and repeat (3x) 2 hand support on bars  -normal stance on airex pad hands free balance in // bars   PATIENT EDUCATION:  Education details: POC  Person educated: Patient  Education method: Explanation  Education comprehension: verbalized understanding  HOME EXERCISE  PROGRAM: Access Code: [email or web address] URL: [email or web address] Date: 05/17/2024 Prepared by: Peggye Linear  Exercises - Sit to Stand with Counter Support  - 1 x daily - 2 sets - 10 reps - Standing March with Counter Support  - 1 x daily - 3 sets - 12 reps - Seated Scapular Retraction  -  1 x daily - 3 sets - 12 reps - Heel Raises with Counter Support  - 1 x daily - 3 sets - 10 reps   GOALS: Goals reviewed with patient? Yes  SHORT TERM GOALS: Target date: 07/19/2024  Consistently perform HEP at least 4x/week to maintain progress made in PT to improve function and overall QoL. Baseline: Initiated on 04/30/2024 05/28/24: updated on 05/17/24 Goal status: IN PROGRESS   LONG TERM GOALS: Target date: 07/19/2024  Pt will decrease her STS time by 10 seconds to improve power and strength which will increase QoL.  Baseline: 33.69 sec BUE support 05/28/24: 29.18 sec w/ BUE support at armrests; 38.34 w/ BUEs at knees Goal status: IN PROGRESS  2.  Pt will decrease her TUG time by 10 seconds to reduce risk of falls and increase QoL.  Baseline: 42.64 sec with rollator 05/28/24: 29.37 sec with RW Goal status: MET   3.  Pt will decrease time by 0.25 m/s in order to increase gait speed to reduce fall risk. Baseline: 04/30/2024: 0.35 m/s using her personal rollator with brakes locked and close SBA/CGA for safety 05/28/24: Average Normal speed: 0.45 m/s with RW, CGA  Goal status: IN PROGRESS  4.  Pt will increase distance walked during to aid with community ambulation and reduce fall risk. Baseline: 04/30/2024: 103 feet, had to stop at 10min40seconds due to fatigue, (31.39 meters, Avg speed 0.69m/s) using her personal rollator with brakes locked and CGA for safety 05/28/24: 206 ft. 62.78 meters, Avg speed 0.73m/s) using a RW with skilled CGA.  Goal status: IN PROGRESS  5.  Pt will increase ABC score to 80% to increase her confidence with daily activities and improve overall  QoL. Baseline: 30%; 05/28/24: 54.375%  Goal status: MET & upgraded on 8/11 from 50% > 80%   ASSESSMENT:  CLINICAL IMPRESSION:   Pt remains quite limited in tolerance, safety, and confidence in overground AMB. Continued to try to increase overall volume and tolerance gait. Pt continuing to require verbal cueing throughout gait trials for increased & even step length, gait speed, especially when participating in cognitive dual task. Pt continues to be reliant on UE support for stair stepping activity, but performed well. Pt will continue to benefit from skilled therapy to address remaining deficits in order to improve overall QoL and return to PLOF.     OBJECTIVE IMPAIRMENTS: Abnormal gait, decreased activity tolerance, decreased balance, decreased coordination, decreased knowledge of use of DME, decreased mobility, difficulty walking, decreased ROM, decreased strength, dizziness, and improper body mechanics.   ACTIVITY LIMITATIONS: carrying, lifting, bending, standing, squatting, transfers, bed mobility, bathing, and locomotion level  PARTICIPATION LIMITATIONS: meal prep, cleaning, laundry, medication management, driving, shopping, community activity, and yard work  PERSONAL FACTORS: Past/current experiences, Time since onset of injury/illness/exacerbation, and 1-2 comorbidities:   are also affecting patient's functional outcome.   REHAB POTENTIAL: Good  CLINICAL DECISION MAKING: Evolving/moderate complexity  EVALUATION COMPLEXITY: Moderate  PLAN:  PT FREQUENCY: 2x/week  PT DURATION: 12 weeks  PLANNED INTERVENTIONS: 97110-Therapeutic exercises, 97530- Therapeutic activity, 97112- Neuromuscular re-education, 97535- Self Care, 02859- Manual therapy, [phone]- Gait training, Patient/Family education, Joint mobilization, and DME instructions  PLAN FOR NEXT SESSION:   Increase ambulation distance to improve endurance, decreased rest breaks Continue sit to stand for BLE strengthening;  decreasing reliance on UEs  1:24 PM, 06/19/24 Peggye JAYSON Linear, PT, DPT Physical Therapist - [facility]  Outpatient Physical Therapy- Main Campus [phone]

## 2024-06-21 ENCOUNTER — Ambulatory Visit

## 2024-06-21 DIAGNOSIS — M25611 Stiffness of right shoulder, not elsewhere classified: Secondary | ICD-10-CM

## 2024-06-21 DIAGNOSIS — M6281 Muscle weakness (generalized): Secondary | ICD-10-CM | POA: Diagnosis not present

## 2024-06-21 DIAGNOSIS — R278 Other lack of coordination: Secondary | ICD-10-CM

## 2024-06-21 DIAGNOSIS — G8929 Other chronic pain: Secondary | ICD-10-CM

## 2024-06-21 DIAGNOSIS — R262 Difficulty in walking, not elsewhere classified: Secondary | ICD-10-CM

## 2024-06-21 NOTE — Therapy (Signed)
 OUTPATIENT PHYSICAL THERAPY TREATMENT  Patient Name: Stacey Ball MRN: [id number] DOB:Oct 28, 1960, 63 y.o., female Today's Date: 06/21/2024  PCP: Alla Amis MD REFERRING PROVIDER: Jonette, Minor  END OF SESSION:    PT End of Session - 06/21/24 1458     Visit Number 15    Number of Visits 24    Date for PT Re-Evaluation 07/19/24    Authorization Type HTA PPO    Progress Note Due on Visit 10    PT Start Time 1448    PT Stop Time 1528    PT Time Calculation (min) 40 min    Equipment Utilized During Treatment Gait belt    Activity Tolerance Patient tolerated treatment well;Patient limited by fatigue    Behavior During Therapy WFL for tasks assessed/performed           Past Medical History:  Diagnosis Date   Anemia    HX of   Arthritis    Bipolar 1 disorder (HCC)    Cancer (HCC)    skin cancer   Cataracts, bilateral    Diabetes mellitus without complication (HCC)    Dyspnea    on exertion (Pt states, out of shape)   Function kidney decreased    High cholesterol    Hypertension    Hypothyroidism    Psoriasis    Psoriatic arthritis (HCC)    Thyroid  disease    Past Surgical History:  Procedure Laterality Date   CATARACT EXTRACTION W/PHACO Left 01/10/2020   Procedure: CATARACT EXTRACTION PHACO AND INTRAOCULAR LENS PLACEMENT (IOC) LEFT DIABETIC;  Surgeon: Ferol Rogue, MD;  Location: [facility];  Service: Ophthalmology;  Laterality: Left;  5.32 0:41.5   CATARACT EXTRACTION W/PHACO Right 02/07/2020   Procedure: CATARACT EXTRACTION PHACO AND INTRAOCULAR LENS PLACEMENT (IOC) RIGHT DIABETIC VISION BLUE 4.01 00:27.4;  Surgeon: Ferol Rogue, MD;  Location: [facility];  Service: Ophthalmology;  Laterality: Right;  Diabetic - oral meds   COLONOSCOPY WITH PROPOFOL  N/A 03/04/2016   Procedure: COLONOSCOPY WITH PROPOFOL ;  Surgeon: Lamar ONEIDA Holmes, MD;  Location: [facility] END[facility];  Service: Endoscopy;  Laterality: N/A;   great toe amputation      matrixectomy     TONSILLECTOMY     Patient Active Problem List   Diagnosis Date Noted   Hypothyroidism 03/26/2022   UTI (urinary tract infection) 03/26/2022   Pneumonia due to COVID-19 virus 10/22/2020   Bipolar disorder (HCC) 10/22/2020   Acute encephalopathy 10/22/2020   Benign hypertensive kidney disease with chronic kidney disease 07/11/2019   Secondary hyperparathyroidism of renal origin (HCC) 07/11/2019   Class 1 obesity due to excess calories with serious comorbidity and body mass index (BMI) of 32.0 to 32.9 in adult 05/09/2019   Type 2 diabetes mellitus with stage 3 chronic kidney disease, without long-term current use of insulin  (HCC) 09/16/2017   Stage 3 chronic kidney disease (HCC) 10/04/2016   Essential hypertriglyceridemia 03/22/2016   Essential hypertension 03/17/2016   Past Surgical History:  Procedure Laterality Date   CATARACT EXTRACTION W/PHACO Left 01/10/2020   Procedure: CATARACT EXTRACTION PHACO AND INTRAOCULAR LENS PLACEMENT (IOC) LEFT DIABETIC;  Surgeon: Ferol Rogue, MD;  Location: [facility];  Service: Ophthalmology;  Laterality: Left;  5.32 0:41.5   CATARACT EXTRACTION W/PHACO Right 02/07/2020   Procedure: CATARACT EXTRACTION PHACO AND INTRAOCULAR LENS PLACEMENT (IOC) RIGHT DIABETIC VISION BLUE 4.01 00:27.4;  Surgeon: Ferol Rogue, MD;  Location: [facility];  Service: Ophthalmology;  Laterality: Right;  Diabetic - oral meds   COLONOSCOPY WITH PROPOFOL   N/A 03/04/2016   Procedure: COLONOSCOPY WITH PROPOFOL ;  Surgeon: Lamar ONEIDA Holmes, MD;  Location: [facility] ENDOSCOPY;  Service: Endoscopy;  Laterality: N/A;   great toe amputation     matrixectomy     TONSILLECTOMY      Past Medical History:  Diagnosis Date   Anemia    HX of   Arthritis    Bipolar 1 disorder (HCC)    Cancer (HCC)    skin cancer   Cataracts, bilateral    Diabetes mellitus without complication (HCC)    Dyspnea    on exertion (Pt states, out of shape)   Function kidney  decreased    High cholesterol    Hypertension    Hypothyroidism    Psoriasis    Psoriatic arthritis (HCC)    Thyroid  disease     ONSET DATE: 2 years ago  REFERRING DIAG: Chronic right shoulder pain [M25.511, G89.29], Leg weakness, bilateral [R29.898]   THERAPY DIAG:   Muscle weakness (generalized)  Other lack of coordination  Stiffness of right shoulder, not elsewhere classified  Difficulty in walking, not elsewhere classified  Rationale for Evaluation and Treatment: Rehabilitation  SUBJECTIVE:                                                                                                                                                                                             SUBJECTIVE STATEMENT:    Pt doing ok today. Forgot her water in the car which she is not pleased about.   PERTINENT HISTORY:  Pt presents to clinic today in transport chair. Pt has had consistent falls for the last 2 years, but she reports not having fallen in the last 6 months. Pt uses a 4WW at home c the brakes constantly on to avoid the rollator rolling outside her BOS. Pt stated she does not like 2WW as she fell over one when attempting to rush to the toilet one night. Pt states she has dizzy spells, particularly when coming from a sit to stand or with head turns during gait. Pt also stated her dizziness has tapered off recently as well. She reports all these started around when she was hospitalized for covid and PNA 2 years ago. Pt has a bed rail in her bed. Installed it after falling out of bed multiple times. Pt has very limited shoulder ROM. She reports her husband helps around a lot at home with ADL and IADL like washing her hair. However, she feels his help is prohibiting her from getting better and requested us  to explain to him that she needs to attempt more tasks on her own.  PAIN:  Are you having pain? No   PRECAUTIONS: Fall  WEIGHT BEARING RESTRICTIONS: No  FALLS: Has patient fallen in  last 6 months? No  LIVING ENVIRONMENT: Lives with: lives with their spouse Lives in: House/apartment Stairs: No Has following equipment at home: Single point cane, Environmental consultant - 2 wheeled, and Environmental consultant - 4 wheeled  PLOF: Independent  PATIENT GOALS: Would like to walk without AD, Work up strength in L and R arm.   OBJECTIVE:  Note: Objective measures were completed at evaluation unless otherwise noted.                                                                                                                             TREATMENT DATE 06/21/24:   -STS x8 from slight elevation hands free  -AMB overground 2:31, 2lb AW c RW, minguardA  -STS x8 from slight elevation hands free  -AMB overground 2:26, 2lb AW c RW minguardA -STS x8 from slight elevation hands free  -AMB overground 2:28, 2lb AW c RW minguardA -STS x8 from slight elevation hands free  -AMB overground 2:26, 2lb AW c RW minguardA -AMB to OT without ankle weights RW x79ft   PATIENT EDUCATION:  Education details: POC  Person educated: Patient  Education method: Explanation  Education comprehension: verbalized understanding  HOME EXERCISE PROGRAM: Access Code: [email or web address] URL: [email or web address] Date: 05/17/2024 Prepared by: Peggye Linear  Exercises - Sit to Stand with Counter Support  - 1 x daily - 2 sets - 10 reps - Standing March with Counter Support  - 1 x daily - 3 sets - 12 reps - Seated Scapular Retraction  - 1 x daily - 3 sets - 12 reps - Heel Raises with Counter Support  - 1 x daily - 3 sets - 10 reps   GOALS: Goals reviewed with patient? Yes  SHORT TERM GOALS: Target date: 07/19/2024  Consistently perform HEP at least 4x/week to maintain progress made in PT to improve function and overall QoL. Baseline: Initiated on 04/30/2024 05/28/24: updated on 05/17/24 Goal status: IN PROGRESS   LONG TERM GOALS: Target date: 07/19/2024  Pt will decrease her STS time by 10 seconds to improve power and  strength which will increase QoL.  Baseline: 33.69 sec BUE support 05/28/24: 29.18 sec w/ BUE support at armrests; 38.34 w/ BUEs at knees Goal status: IN PROGRESS  2.  Pt will decrease her TUG time by 10 seconds to reduce risk of falls and increase QoL.  Baseline: 42.64 sec with rollator 05/28/24: 29.37 sec with RW Goal status: MET   3.  Pt will decrease time by 0.25 m/s in order to increase gait speed to reduce fall risk. Baseline: 04/30/2024: 0.35 m/s using her personal rollator with brakes locked and close SBA/CGA for safety 05/28/24: Average Normal speed: 0.45 m/s with RW, CGA  Goal status: IN PROGRESS  4.  Pt will increase distance walked during to aid with community ambulation and reduce fall risk. Baseline:  04/30/2024: 103 feet, had to stop at 24min40seconds due to fatigue, (31.39 meters, Avg speed 0.10m/s) using her personal rollator with brakes locked and CGA for safety 05/28/24: 206 ft. 62.78 meters, Avg speed 0.4m/s) using a RW with skilled CGA.  Goal status: IN PROGRESS  5.  Pt will increase ABC score to 80% to increase her confidence with daily activities and improve overall QoL. Baseline: 30%; 05/28/24: 54.375%  Goal status: MET & upgraded on 8/11 from 50% > 80%   ASSESSMENT:  CLINICAL IMPRESSION:   Tried to move from // bars to overground with RW today, gradual progression in overall exertion with slow progressive reduction in rest break times. Pt is consistent in her AMB performance, however small amplitude steps and slow movements dominating walking unless 100% focused. Pt will continue to benefit from skilled therapy to address remaining deficits in order to improve overall QoL and return to PLOF.    OBJECTIVE IMPAIRMENTS: Abnormal gait, decreased activity tolerance, decreased balance, decreased coordination, decreased knowledge of use of DME, decreased mobility, difficulty walking, decreased ROM, decreased strength, dizziness, and improper body mechanics.    ACTIVITY LIMITATIONS: carrying, lifting, bending, standing, squatting, transfers, bed mobility, bathing, and locomotion level  PARTICIPATION LIMITATIONS: meal prep, cleaning, laundry, medication management, driving, shopping, community activity, and yard work  PERSONAL FACTORS: Past/current experiences, Time since onset of injury/illness/exacerbation, and 1-2 comorbidities:   are also affecting patient's functional outcome.   REHAB POTENTIAL: Good  CLINICAL DECISION MAKING: Evolving/moderate complexity  EVALUATION COMPLEXITY: Moderate  PLAN:  PT FREQUENCY: 2x/week  PT DURATION: 12 weeks  PLANNED INTERVENTIONS: 97110-Therapeutic exercises, 97530- Therapeutic activity, 97112- Neuromuscular re-education, 97535- Self Care, 02859- Manual therapy, [phone]- Gait training, Patient/Family education, Joint mobilization, and DME instructions  PLAN FOR NEXT SESSION:   Increase ambulation distance to improve endurance, decreased rest breaks Continue sit to stand for BLE strengthening; decreasing reliance on UEs  3:04 PM, 06/21/24 Peggye JAYSON Linear, PT, DPT Physical Therapist - [facility]  Outpatient Physical Therapy- Main Campus [phone]

## 2024-06-22 NOTE — Therapy (Signed)
 OUTPATIENT OCCUPATIONAL THERAPY ORTHO TREATMENT NOTE  Patient Name: Stacey Ball MRN: [id number] DOB:November 12, 1960, 63 y.o., female Today's Date: 05/07/2024  PCP: Dr. Alda Helling REFERRING PROVIDER: Dr. Alda Helling  END OF SESSION:   OT End of Session - 06/22/24 1124     Visit Number 11    Number of Visits 24    Date for OT Re-Evaluation 07/30/24    Progress Note Due on Visit 10    OT Start Time 1530    OT Stop Time 1615    OT Time Calculation (min) 45 min    Equipment Utilized During Treatment transport chair    Activity Tolerance Patient tolerated treatment well    Behavior During Therapy WFL for tasks assessed/performed         Past Medical History:  Diagnosis Date   Anemia    HX of   Arthritis    Bipolar 1 disorder (HCC)    Cancer (HCC)    skin cancer   Cataracts, bilateral    Diabetes mellitus without complication (HCC)    Dyspnea    on exertion (Pt states, out of shape)   Function kidney decreased    High cholesterol    Hypertension    Hypothyroidism    Psoriasis    Psoriatic arthritis (HCC)    Thyroid  disease    Past Surgical History:  Procedure Laterality Date   CATARACT EXTRACTION W/PHACO Left 01/10/2020   Procedure: CATARACT EXTRACTION PHACO AND INTRAOCULAR LENS PLACEMENT (IOC) LEFT DIABETIC;  Surgeon: Ferol Rogue, MD;  Location: [facility];  Service: Ophthalmology;  Laterality: Left;  5.32 0:41.5   CATARACT EXTRACTION W/PHACO Right 02/07/2020   Procedure: CATARACT EXTRACTION PHACO AND INTRAOCULAR LENS PLACEMENT (IOC) RIGHT DIABETIC VISION BLUE 4.01 00:27.4;  Surgeon: Ferol Rogue, MD;  Location: [facility];  Service: Ophthalmology;  Laterality: Right;  Diabetic - oral meds   COLONOSCOPY WITH PROPOFOL  N/A 03/04/2016   Procedure: COLONOSCOPY WITH PROPOFOL ;  Surgeon: Lamar ONEIDA Holmes, MD;  Location: [facility];  Service: Endoscopy;  Laterality: N/A;   great toe amputation     matrixectomy     TONSILLECTOMY      Patient Active Problem List   Diagnosis Date Noted   Hypothyroidism 03/26/2022   UTI (urinary tract infection) 03/26/2022   Pneumonia due to COVID-19 virus 10/22/2020   Bipolar disorder (HCC) 10/22/2020   Acute encephalopathy 10/22/2020   Benign hypertensive kidney disease with chronic kidney disease 07/11/2019   Secondary hyperparathyroidism of renal origin (HCC) 07/11/2019   Class 1 obesity due to excess calories with serious comorbidity and body mass index (BMI) of 32.0 to 32.9 in adult 05/09/2019   Type 2 diabetes mellitus with stage 3 chronic kidney disease, without long-term current use of insulin  (HCC) 09/16/2017   Stage 3 chronic kidney disease (HCC) 10/04/2016   Essential hypertriglyceridemia 03/22/2016   Essential hypertension 03/17/2016   ONSET DATE: 03/26/22  REFERRING DIAG: M25.511,G89.29 (ICD-10-CM) - Chronic right shoulder pain   THERAPY DIAG:  Muscle weakness (generalized)  Other lack of coordination  Chronic right shoulder pain  Rationale for Evaluation and Treatment: Rehabilitation  SUBJECTIVE:  SUBJECTIVE STATEMENT: Pt acknowledges that she wants to be more indep and needs to start having her husband help her less.   Pt accompanied by: self  PERTINENT HISTORY:  Pt returns today to restart therapy for her R shoulder. Pt was seen by this therapist in 2023 post R proximal humerus fx, non-surgical.  Pt attended 4 OT visits in 2023, but then  self discharged, though she doesn't recall her reasoning for stopping. Pt did admit that she was stubborn, and should have kept going, as she remains quite limited with her shoulder ROM. Pt also currently being seen by outpatient PT  to address balance/mobility deficits d/t hx of falling.  Per chart on 03/26/22, 63 y/o old female with PMH significant for bipolar disorder, anemia, cataracts, hyperlipidemia, hypertension, hypothyroidism, obesity, type 2 diabetes, CKD stage IIIb presented in the ED with c/o: generalized  weakness  and recurrent falls. Patient has generalized weakness for some time which has gotten worse.  She had a fall yesterday and was evaluated outpatient and found to have right humeral fracture and was placed in a sling.  Patient had additional fall yesterday, denies any loss of consciousness or head trauma or head injury. In the ED work-up revealed UA positive for UTI.  Lactic acid normal.  x-ray right shoulder shows humeral neck fracture. ED physician spoke with orthopedics to discuss need for urgent evaluation.  Patient was admitted for generalized weakness secondary to UTI,  started on IV antibiotics.  Orthopedics consulted,  recommended conservative management. Patient is not a candidate for surgical intervention.  Advised outpatient follow-up.  Patient completed antibiotics for 3 days. PT recommended  skilled nursing facility for rehab.  Patient feels better and want to be discharged.  Patient being discharged skilled nursing facility for rehab.   PRECAUTIONS: Fall  RED FLAGS: None   WEIGHT BEARING RESTRICTIONS: No  PAIN: 06/21/24: occasional R shoulder pain, 1-3/10 pain with activity 0/10 pain at rest or activity in R shoulder, but does report pain in bilat knees and low back  Are you having pain? Yes: NPRS scale: 6/10  Pain location: low back and bilat knees Pain description: achy Aggravating factors: prolonged standing/walking Relieving factors: rest, Tylenol   FALLS: Has patient fallen in last 6 months? Yes. Number of falls pt unsure, but reports 6 falls, but within more than 6 months   LIVING ENVIRONMENT: Lives with: lives with their spouse Lives in: first floor apt  Stairs: No Has following equipment at home: Counselling psychologist, Environmental consultant - 2 wheeled, Environmental consultant - 4 wheeled, Wheelchair (manual), Shower bench, bed side commode, and Grab bars, bed rail   PLOF:  Independent with basic ADLs, spouse and pt shared IADL responsibilities prior to R shoulder fx in 2023  PATIENT GOALS: I would like  to get better movement in my shoulder.   NEXT MD VISIT: Pt unsure, Not for awhile.  OBJECTIVE:  Note: Objective measures were completed at Evaluation unless otherwise noted.  HAND DOMINANCE: Right  ADLs: Overall ADLs: spouse provides assist as needed.  Pt reports, He's doing a lot now.  Transfers/ambulation related to ADLs: supv-modified indep with AD Eating: indep  Grooming: uses L non-dominant hand to brush hair; able to use R dominant hand to brush teeth with electric toothbrush Upper body dressing: set up-modified indep with donning sports bra and shirt overhead  Lower body dressing: occasional help with clothing set up, extra time and effort to don socks, slip on shoes, indep with panties and pants  Toileting: 3in1 over toilet, uses L hand at baseline for pericare; has tried a toilet wand in the past, but minimally effective  Bathing: spouse helps to wash hair and back, spouse helps to wash buttocks Tub shower transfers: supv-min A for lifting legs over tub shower with pt using transfer tub bench and grab bar  Equipment: see above  UPPER EXTREMITY ROM:  Active ROM Left Eval WNL throughout Right eval Right 06/13/24  Shoulder flexion  30 (155) 40 (155)  Shoulder abduction  51 (153) 55 (155)  Shoulder adduction     Shoulder extension     Shoulder internal rotation  ~thumb to L4-5 (R side) ~thumb to L4-5 (middle)  Shoulder external rotation  5 (90) arm held in 90 abd by OT 5 (90) arm held in 90 abd by OT  Elbow flexion     Elbow extension     Wrist flexion     Wrist extension     Wrist ulnar deviation     Wrist radial deviation     Wrist pronation     Wrist supination     (Blank rows = not tested)  UPPER EXTREMITY MMT:     MMT Left eval Right eval Right 06/13/24  Shoulder flexion 4+ 2 2  Shoulder abduction 4+ 2 2  Shoulder adduction     Shoulder extension     Shoulder internal rotation 4+ 4- 4  Shoulder external rotation 4- 2 2  Middle trapezius      Lower trapezius     Elbow flexion 4+ 4 4+  Elbow extension 4+ 5 5  Wrist flexion 4+ 5 5  Wrist extension 4+ 5 5  Wrist ulnar deviation     Wrist radial deviation     Wrist pronation     Wrist supination     (Blank rows = not tested)  HAND FUNCTION: Grip strength: Right: 37 lbs; Left: 37 lbs, Lateral pinch: Right: 13 lbs, Left: 9 lbs, and 3 point pinch: Right: 9 lbs, Left: 6 lbs 06/13/24: Grip strength: Right: 35 lbs, Left: 35 lbs  COORDINATION: NT at eval d/t time constraints; will test in upcoming sessions if indicated  05/10/24: 9 hole peg test L 36, R 40  SENSATION:  WFL  EDEMA: No visible edema  COGNITION: Overall cognitive status: History of cognitive impairments - at baseline, decreased memory, hx of bipolar disorder   OBSERVATIONS:  Pt pleasant, cooperative, verbalizes eagerness to improve R shoulder mobility.  TREATMENT DATE: 06/21/24   Therapeutic Exercise: -Continued focus on HEP for RUE strengthening: -RUE AAROM (no theraband) for shoulder flex, abd, ER with elbow at side; min A from OT to depress R shoulder and maintain elbow extension during flex/abd x2 sets 10 reps each -Yellow theraband for R elbow flex and seated row/shoulder retraction x2 sets 10 reps each; min tactile cues to maintain form/technique  Self Care: -Educated on compensatory strategies/AE (suction shower hose mount)/positioning for increasing indep with shampooing/rinsing hair in shower -Review of hair care strategies to place barrett in hair, propping RUE on table top to support shoulder -Educated on option of transfer tub bench and options to obtain to increase indep with tub/transfer -Reviewed indications for ortho follow up after therapy discharge                                                                                                       PATIENT EDUCATION: Education details: ADL strategies/AE Person educated: Patient Education method:  Explanation, vc Education comprehension:  verbalized understanding, further training needed  HOME EXERCISE PROGRAM: Access Code: [email or web address] URL: [email or web address] Date: 05/28/2024 Prepared by: Inocente Blazing  Exercises - Seated Shoulder Row with Resistance Anchored at Feet  - 1 x daily - 3-5 x weekly - 2-3 sets - 10 reps - Seated Chest Press with Resistance Band  - 1 x daily - 3-5 x weekly - 2-3 sets - 10 reps - Seated Elbow Flexion with Self-Anchored Resistance  - 1 x daily - 3-5 x weekly - 2-3 sets - 10 reps - Standing Shoulder Flexion to 90 Degrees  - 1 x daily - 3-5 x weekly - 2-3 sets - 10 reps - Shoulder Abduction - Thumbs Up  - 1 x daily - 3-5 x weekly - 2-3 sets - 10 reps - Shoulder External Rotation and Scapular Retraction with Resistance  - 1 x daily - 3-5 x weekly - 2-3 sets - 10 reps - Standing Shoulder Internal Rotation with Anchored Resistance  - 1 x daily - 7 x weekly - 3 sets - 10 reps   AAROM  for shoulder flexion, and abduction at the tabletop surface.  GOALS: Goals reviewed with patient? Yes  SHORT TERM GOALS: Target date: 06/18/24  Pt will be indep with HEP for increasing strength and ROM in the R shoulder. Baseline: Eval: To be initiated in upcoming sessions; 06/13/24: Min-mod vc to follow written handout for RUE strengthening Goal status: in progress   2.  Pt will identify and implement 2-3 activity modifications/AE in order to compensate for RUE weakness in order to maximize indep with BADLs. Baseline: Eval: Educ not yet initiated; 06/13/24: Hair care strategies reviewed last session, though pt reports no practice yet at home; initiated education on long handled AE to ease bathing and toileting ADLs this date  Goal status: in progress  LONG TERM GOALS: Target date: 07/30/24  1.  Pt will increase R active shoulder flexion and abd to 70 degrees or better to better engage the RUE into BADLs. Baseline: Eval: R shoulder flex 30, abd 51; 06/13/24: flex 40, abd 55 Goal status: in progress   2.   Pt will increase active R shoulder ER to 30 degrees or better to enable brushing/combing R side of head with R dominant hand and arm propped as needed. Baseline: Eval: Pt brushes hair with L non-dominant arm; active R shoulder ER 5* with arm propped into abd; 06/13/24: no change from eval Goal status: in progress   3.  Pt will increase active R shoulder IR for increased thoroughness with posterior LB bathing. Baseline: Eval: ~thumb to L4-5; pt reports spouse has to help with posterior in shower; 06/13/24: spouse continues to assist; would benefit from long handled sponge; pt does demo improved reach to mid lower back, previously R side of lower back  Goal status: in progress  3. Pt will increase R shoulder strength by 1/2 muscle grade or more in order to better engage the RUE into BADLs.  Baseline: Eval: R shoulder grossly 2/5; 06/13/24: 1/2 grade improvement with IR and biceps only  Goal status: in progress  ASSESSMENT: CLINICAL IMPRESSION: Pt described spouse with beginnings of caregiver burnout and pt verbalizing desire to increase indep with daily tasks.  Pt continues to be receptive to education focused on increasing indep with bathing and hair care.  Pt did acknowledge some self limiting behavior, reporting that she has not yet attempted ADL strategies reviewed in previous sessions as she states that her husband always  helps her.  OT reinforced benefits of pt making attempts with ADL strategies/AE for increasing indep with daily tasks, also promoting general improvements in strength/activity tolerance, while reducing physical burden on spouse.  Pt verbalized understanding.  Will continue to review Hep to maximize indep with form and technique, but pt is showing improved awareness of substitution patterns.  Pt. continues to benefit from skilled OT services to maximize safety and independence in necessary daily tasks.     PERFORMANCE DEFICITS: in functional skills including ADLs, IADLs,  coordination, ROM, strength, pain, flexibility, Gross motor control, mobility, balance, body mechanics, decreased knowledge of precautions, decreased knowledge of use of DME, and UE functional use, cognitive skills including memory, and psychosocial skills including coping strategies, environmental adaptation, habits, and routines and behaviors.   IMPAIRMENTS: are limiting patient from ADLs, IADLs, and leisure.   COMORBIDITIES: has co-morbidities such as bipolar disorder, CKD, HTN, DM2, arthritis that affects occupational performance. Patient will benefit from skilled OT to address above impairments and improve overall function.  MODIFICATION OR ASSISTANCE TO COMPLETE EVALUATION: Min-Moderate modification of tasks or assist with assess necessary to complete an evaluation.  OT OCCUPATIONAL PROFILE AND HISTORY: Detailed assessment: Review of records and additional review of physical, cognitive, psychosocial history related to current functional performance.  CLINICAL DECISION MAKING: Moderate - several treatment options, min-mod task modification necessary  REHAB POTENTIAL: Fair based on limited participation last episode of OT in 2023  EVALUATION COMPLEXITY: Moderate  PLAN:  OT FREQUENCY: 2x/week  OT DURATION: 12 weeks  PLANNED INTERVENTIONS: 97168 OT Re-evaluation, 97535 self care/ADL training, 02889 therapeutic exercise, 97530 therapeutic activity, 97140 manual therapy, 97010 moist heat, 97010 cryotherapy, 97750 Physical Performance Testing, passive range of motion, psychosocial skills training, energy conservation, coping strategies training, patient/family education, and DME and/or AE instructions  RECOMMENDED OTHER SERVICES: None at this time (Pt currently seeing PT for mobility deficits)  CONSULTED AND AGREED WITH PLAN OF CARE: Patient  PLAN FOR NEXT SESSION: see above   Inocente Blazing, MS, OTR/L  06/22/2024, 11:25 AM

## 2024-06-25 ENCOUNTER — Ambulatory Visit

## 2024-06-25 DIAGNOSIS — G8929 Other chronic pain: Secondary | ICD-10-CM

## 2024-06-25 DIAGNOSIS — R2681 Unsteadiness on feet: Secondary | ICD-10-CM

## 2024-06-25 DIAGNOSIS — R278 Other lack of coordination: Secondary | ICD-10-CM

## 2024-06-25 DIAGNOSIS — M6281 Muscle weakness (generalized): Secondary | ICD-10-CM

## 2024-06-25 DIAGNOSIS — R262 Difficulty in walking, not elsewhere classified: Secondary | ICD-10-CM

## 2024-06-25 DIAGNOSIS — R2689 Other abnormalities of gait and mobility: Secondary | ICD-10-CM

## 2024-06-25 NOTE — Therapy (Signed)
 OUTPATIENT PHYSICAL THERAPY TREATMENT  Patient Name: Stacey Ball MRN: [id number] DOB:[birth year], 63 y.o., female Today's Date: 06/25/2024  PCP: Alla Amis MD REFERRING PROVIDER: Jonette, Minor  END OF SESSION:    PT End of Session - 06/25/24 1411     Visit Number 16    Number of Visits 24    Date for PT Re-Evaluation 07/19/24    Authorization Type H[employer] PPO    Progress Note Due on Visit 10    PT Start Time 1400    PT Stop Time 1440    PT Time Calculation (min) 40 min    Equipment Utilized During Treatment Gait belt    Activity Tolerance Patient tolerated treatment well;Patient limited by fatigue    Behavior During Therapy WFL for tasks assessed/performed           Past Medical History:  Diagnosis Date   Anemia    HX of   Arthritis    Bipolar 1 disorder (HCC)    Cancer (HCC)    skin cancer   Cataracts, bilateral    Diabetes mellitus without complication (HCC)    Dyspnea    on exertion (Pt states, out of shape)   Function kidney decreased    High cholesterol    Hypertension    Hypothyroidism    Psoriasis    Psoriatic arthritis (HCC)    Thyroid  disease    Past Surgical History:  Procedure Laterality Date   CATARACT EXTRACTION W/PHACO Left 01/10/2020   Procedure: CATARACT EXTRACTION PHACO AND INTRAOCULAR LENS PLACEMENT (IOC) LEFT DIABETIC;  Surgeon: Ferol Rogue, MD;  Location: [facility];  Service: Ophthalmology;  Laterality: Left;  5.32 0:41.5   CATARACT EXTRACTION W/PHACO Right 02/07/2020   Procedure: CATARACT EXTRACTION PHACO AND INTRAOCULAR LENS PLACEMENT (IOC) RIGHT DIABETIC VISION BLUE 4.01 00:27.4;  Surgeon: Ferol Rogue, MD;  Location: [facility];  Service: Ophthalmology;  Laterality: Right;  Diabetic - oral meds   COLONOSCOPY WITH PROPOFOL  N/A 03/04/2016   Procedure: COLONOSCOPY WITH PROPOFOL ;  Surgeon: Lamar ONEIDA Holmes, MD;  Location: [facility];  Service: Endoscopy;  Laterality: N/A;   great toe amputation      matrixectomy     TONSILLECTOMY     Patient Active Problem List   Diagnosis Date Noted   Hypothyroidism 03/26/2022   UTI (urinary tract infection) 03/26/2022   Pneumonia due to COVID-19 virus 10/22/2020   Bipolar disorder (HCC) 10/22/2020   Acute encephalopathy 10/22/2020   Benign hypertensive kidney disease with chronic kidney disease 07/11/2019   Secondary hyperparathyroidism of renal origin (HCC) 07/11/2019   Class 1 obesity due to excess calories with serious comorbidity and body mass index (BMI) of 32.0 to 32.9 in adult 05/09/2019   Type 2 diabetes mellitus with stage 3 chronic kidney disease, without long-term current use of insulin  (HCC) 09/16/2017   Stage 3 chronic kidney disease (HCC) 10/04/2016   Essential hypertriglyceridemia 03/22/2016   Essential hypertension 03/17/2016   Past Surgical History:  Procedure Laterality Date   CATARACT EXTRACTION W/PHACO Left 01/10/2020   Procedure: CATARACT EXTRACTION PHACO AND INTRAOCULAR LENS PLACEMENT (IOC) LEFT DIABETIC;  Surgeon: Ferol Rogue, MD;  Location: [facility];  Service: Ophthalmology;  Laterality: Left;  5.32 0:41.5   CATARACT EXTRACTION W/PHACO Right 02/07/2020   Procedure: CATARACT EXTRACTION PHACO AND INTRAOCULAR LENS PLACEMENT (IOC) RIGHT DIABETIC VISION BLUE 4.01 00:27.4;  Surgeon: Ferol Rogue, MD;  Location: [facility];  Service: Ophthalmology;  Laterality: Right;  Diabetic - oral meds   COLONOSCOPY WITH PROPOFOL   N/A 03/04/2016   Procedure: COLONOSCOPY WITH PROPOFOL ;  Surgeon: Lamar ONEIDA Holmes, MD;  Location: [facility];  Service: Endoscopy;  Laterality: N/A;   great toe amputation     matrixectomy     TONSILLECTOMY      Past Medical History:  Diagnosis Date   Anemia    HX of   Arthritis    Bipolar 1 disorder (HCC)    Cancer (HCC)    skin cancer   Cataracts, bilateral    Diabetes mellitus without complication (HCC)    Dyspnea    on exertion (Pt states, out of shape)   Function kidney  decreased    High cholesterol    Hypertension    Hypothyroidism    Psoriasis    Psoriatic arthritis (HCC)    Thyroid  disease     ONSET DATE: 2 years ago  REFERRING DIAG: Chronic right shoulder pain [M25.511, G89.29], Leg weakness, bilateral [R29.898]   THERAPY DIAG:   Muscle weakness (generalized)  Other lack of coordination  Chronic right shoulder pain  Difficulty in walking, not elsewhere classified  Other abnormalities of gait and mobility  Unsteadiness on feet  Rationale for Evaluation and Treatment: Rehabilitation  SUBJECTIVE:                                                                                                                                                                                             SUBJECTIVE STATEMENT:    Pt doing ok today. Comes to use direct from OT. No updates since last session.     PERTINENT HISTORY:  Pt presents to clinic today in transport chair. Pt has had consistent falls for the last 2 years, but she reports not having fallen in the last 6 months. Pt uses a 4WW at home c the brakes constantly on to avoid the rollator rolling outside her BOS. Pt stated she does not like 2WW as she fell over one when attempting to rush to the toilet one night. Pt states she has dizzy spells, particularly when coming from a sit to stand or with head turns during gait. Pt also stated her dizziness has tapered off recently as well. She reports all these started around when she was hospitalized for covid and PNA 2 years ago. Pt has a bed rail in her bed. Installed it after falling out of bed multiple times. Pt has very limited shoulder ROM. She reports her husband helps around a lot at home with ADL and IADL like washing her hair. However, she feels his help is prohibiting her from getting better and requested us  to explain to him that she needs  to attempt more tasks on her own.  PAIN:  Are you having pain? No   PRECAUTIONS: Fall  WEIGHT BEARING  RESTRICTIONS: No  FALLS: Has patient fallen in last 6 months? No  LIVING ENVIRONMENT: Lives with: lives with their spouse Lives in: House/apartment Stairs: No Has following equipment at home: Single point cane, Environmental consultant - 2 wheeled, and Environmental consultant - 4 wheeled  PLOF: Independent  PATIENT GOALS: Would like to walk without AD, Work up strength in L and R arm.   OBJECTIVE:  Note: Objective measures were completed at evaluation unless otherwise noted.                                                                                                                             TREATMENT DATE 06/25/24:   -AMB overground 130ft 66m15sec, 3lb AW c RW, minGuardA to supervision  -AMB overground 139ft 45m55sec, 3lb AW c RW minGuardA to supervision -AMB overground 131ft 60m46sec, 3lb AW c RW minGuardA to supervision -AMB overground 134ft 70m  sec, 3lb AW c RW minGuardA to supervision -in // bars AMB 5lb AW bilat, forward with foam step up/down, 4 step up/down and 2 firm step up/down 1x67ft (pretty limited by chronic knee pain)      PATIENT EDUCATION:  Education details: POC  Person educated: Patient  Education method: Explanation  Education comprehension: verbalized understanding  HOME EXERCISE PROGRAM: Access Code: [email or web address] URL: [email or web address] Date: 05/17/2024 Prepared by: Peggye Linear  Exercises - Sit to Stand with Counter Support  - 1 x daily - 2 sets - 10 reps - Standing March with Counter Support  - 1 x daily - 3 sets - 12 reps - Seated Scapular Retraction  - 1 x daily - 3 sets - 12 reps - Heel Raises with Counter Support  - 1 x daily - 3 sets - 10 reps   GOALS: Goals reviewed with patient? Yes  SHORT TERM GOALS: Target date: 07/19/2024  Consistently perform HEP at least 4x/week to maintain progress made in PT to improve function and overall QoL. Baseline: Initiated on 04/30/2024 05/28/24: updated on 05/17/24 Goal status: IN PROGRESS   LONG TERM GOALS: Target  date: 07/19/2024  Pt will decrease her STS time by 10 seconds to improve power and strength which will increase QoL.  Baseline: 33.69 sec BUE support 05/28/24: 29.18 sec w/ BUE support at armrests; 38.34 w/ BUEs at knees Goal status: IN PROGRESS  2.  Pt will decrease her TUG time by 10 seconds to reduce risk of falls and increase QoL.  Baseline: 42.64 sec with rollator 05/28/24: 29.37 sec with RW Goal status: MET   3.  Pt will decrease time by 0.25 m/s in order to increase gait speed to reduce fall risk. Baseline: 04/30/2024: 0.35 m/s using her personal rollator with brakes locked and close SBA/CGA for safety 05/28/24: Average Normal speed: 0.45 m/s with RW, CGA  Goal status: IN PROGRESS  4.  Pt will increase  distance walked during to aid with community ambulation and reduce fall risk. Baseline: 04/30/2024: 103 feet, had to stop at 32min40seconds due to fatigue, (31.39 meters, Avg speed 0.48m/s) using her personal rollator with brakes locked and CGA for safety 05/28/24: 206 ft. 62.78 meters, Avg speed 0.71m/s) using a RW with skilled CGA.  Goal status: IN PROGRESS  5.  Pt will increase ABC score to 80% to increase her confidence with daily activities and improve overall QoL. Baseline: 30%; 05/28/24: 54.375%  Goal status: MET & upgraded on 8/11 from 50% > 80%   ASSESSMENT:  CLINICAL IMPRESSION:   Progressing AMB activity today, increased resistance and reduced rest break times, but kept distance consistent with last session. Pt is consistent in her AMB performance, however small amplitude steps and slow movements dominating walking unless 100% focused. Pt does a better job of attending to large steps during walks with fewer cues needed today. Pt will continue to benefit from skilled therapy to address remaining deficits in order to improve overall QoL and return to PLOF.    OBJECTIVE IMPAIRMENTS: Abnormal gait, decreased activity tolerance, decreased balance, decreased coordination,  decreased knowledge of use of DME, decreased mobility, difficulty walking, decreased ROM, decreased strength, dizziness, and improper body mechanics.   ACTIVITY LIMITATIONS: carrying, lifting, bending, standing, squatting, transfers, bed mobility, bathing, and locomotion level  PARTICIPATION LIMITATIONS: meal prep, cleaning, laundry, medication management, driving, shopping, community activity, and yard work  PERSONAL FACTORS: Past/current experiences, Time since onset of injury/illness/exacerbation, and 1-2 comorbidities:   are also affecting patient's functional outcome.   REHAB POTENTIAL: Good  CLINICAL DECISION MAKING: Evolving/moderate complexity  EVALUATION COMPLEXITY: Moderate  PLAN:  PT FREQUENCY: 2x/week  PT DURATION: 12 weeks  PLANNED INTERVENTIONS: 97110-Therapeutic exercises, 97530- Therapeutic activity, 97112- Neuromuscular re-education, 97535- Self Care, [id number]- Manual therapy, [phone]- Gait training, Patient/Family education, Joint mobilization, and DME instructions  PLAN FOR NEXT SESSION:   Increase ambulation distance to improve endurance, decreased rest breaks Continue sit to stand for BLE strengthening; decreasing reliance on UEs  2:12 PM, 06/25/24 Peggye JAYSON Linear, PT, DPT Physical Therapist - [facility]  Outpatient Physical Therapy- Main Campus [phone]

## 2024-06-27 NOTE — Therapy (Signed)
 OUTPATIENT OCCUPATIONAL THERAPY ORTHO TREATMENT NOTE  Patient Name: Stacey Ball MRN: [id number] DOB:04/12/1961, 63 y.o., female Today's Date: 05/07/2024  PCP: Dr. Alda Helling REFERRING PROVIDER: Dr. Alda Helling  END OF SESSION:   OT End of Session - 06/27/24 1359     Visit Number 12    Number of Visits 24    Date for OT Re-Evaluation 07/30/24    Progress Note Due on Visit 20    OT Start Time 1315    OT Stop Time 1400    OT Time Calculation (min) 45 min    Equipment Utilized During Treatment RW    Activity Tolerance Patient tolerated treatment well    Behavior During Therapy WFL for tasks assessed/performed         Past Medical History:  Diagnosis Date   Anemia    HX of   Arthritis    Bipolar 1 disorder (HCC)    Cancer (HCC)    skin cancer   Cataracts, bilateral    Diabetes mellitus without complication (HCC)    Dyspnea    on exertion (Pt states, out of shape)   Function kidney decreased    High cholesterol    Hypertension    Hypothyroidism    Psoriasis    Psoriatic arthritis (HCC)    Thyroid  disease    Past Surgical History:  Procedure Laterality Date   CATARACT EXTRACTION W/PHACO Left 01/10/2020   Procedure: CATARACT EXTRACTION PHACO AND INTRAOCULAR LENS PLACEMENT (IOC) LEFT DIABETIC;  Surgeon: Ferol Rogue, MD;  Location: [facility];  Service: Ophthalmology;  Laterality: Left;  5.32 0:41.5   CATARACT EXTRACTION W/PHACO Right 02/07/2020   Procedure: CATARACT EXTRACTION PHACO AND INTRAOCULAR LENS PLACEMENT (IOC) RIGHT DIABETIC VISION BLUE 4.01 00:27.4;  Surgeon: Ferol Rogue, MD;  Location: [facility];  Service: Ophthalmology;  Laterality: Right;  Diabetic - oral meds   COLONOSCOPY WITH PROPOFOL  N/A 03/04/2016   Procedure: COLONOSCOPY WITH PROPOFOL ;  Surgeon: Lamar ONEIDA Holmes, MD;  Location: [facility];  Service: Endoscopy;  Laterality: N/A;   great toe amputation     matrixectomy     TONSILLECTOMY     Patient Active  Problem List   Diagnosis Date Noted   Hypothyroidism 03/26/2022   UTI (urinary tract infection) 03/26/2022   Pneumonia due to COVID-19 virus 10/22/2020   Bipolar disorder (HCC) 10/22/2020   Acute encephalopathy 10/22/2020   Benign hypertensive kidney disease with chronic kidney disease 07/11/2019   Secondary hyperparathyroidism of renal origin (HCC) 07/11/2019   Class 1 obesity due to excess calories with serious comorbidity and body mass index (BMI) of 32.0 to 32.9 in adult 05/09/2019   Type 2 diabetes mellitus with stage 3 chronic kidney disease, without long-term current use of insulin  (HCC) 09/16/2017   Stage 3 chronic kidney disease (HCC) 10/04/2016   Essential hypertriglyceridemia 03/22/2016   Essential hypertension 03/17/2016   ONSET DATE: 03/26/22  REFERRING DIAG: M25.511,G89.29 (ICD-10-CM) - Chronic right shoulder pain   THERAPY DIAG:  Muscle weakness (generalized)  Other lack of coordination  Chronic right shoulder pain  Rationale for Evaluation and Treatment: Rehabilitation  SUBJECTIVE:  SUBJECTIVE STATEMENT: Pt arrived with RW instead of hospital transport chair today, stating that she's trying to push herself to walk more. Pt accompanied by: self  PERTINENT HISTORY:  Pt returns today to restart therapy for her R shoulder. Pt was seen by this therapist in 2023 post R proximal humerus fx, non-surgical.  Pt attended 4 OT visits in 2023, but then self discharged,  though she doesn't recall her reasoning for stopping. Pt did admit that she was stubborn, and should have kept going, as she remains quite limited with her shoulder ROM. Pt also currently being seen by outpatient PT  to address balance/mobility deficits d/t hx of falling.  Per chart on 03/26/22, 63 y/o old female with PMH significant for bipolar disorder, anemia, cataracts, hyperlipidemia, hypertension, hypothyroidism, obesity, type 2 diabetes, CKD stage IIIb presented in the ED with c/o: generalized  weakness and  recurrent falls. Patient has generalized weakness for some time which has gotten worse.  She had a fall yesterday and was evaluated outpatient and found to have right humeral fracture and was placed in a sling.  Patient had additional fall yesterday, denies any loss of consciousness or head trauma or head injury. In the ED work-up revealed UA positive for UTI.  Lactic acid normal.  x-ray right shoulder shows humeral neck fracture. ED physician spoke with orthopedics to discuss need for urgent evaluation.  Patient was admitted for generalized weakness secondary to UTI,  started on IV antibiotics.  Orthopedics consulted,  recommended conservative management. Patient is not a candidate for surgical intervention.  Advised outpatient follow-up.  Patient completed antibiotics for 3 days. PT recommended  skilled nursing facility for rehab.  Patient feels better and want to be discharged.  Patient being discharged skilled nursing facility for rehab.   PRECAUTIONS: Fall  RED FLAGS: None   WEIGHT BEARING RESTRICTIONS: No  PAIN: 06/25/24: occasional R shoulder pain, 1-3/10 pain with activity 0/10 pain at rest or activity in R shoulder, but does report pain in bilat knees and low back  Are you having pain? Yes: NPRS scale: 6/10  Pain location: low back and bilat knees Pain description: achy Aggravating factors: prolonged standing/walking Relieving factors: rest, Tylenol   FALLS: Has patient fallen in last 6 months? Yes. Number of falls pt unsure, but reports 6 falls, but within more than 6 months   LIVING ENVIRONMENT: Lives with: lives with their spouse Lives in: first floor apt  Stairs: No Has following equipment at home: Counselling psychologist, Environmental consultant - 2 wheeled, Environmental consultant - 4 wheeled, Wheelchair (manual), Shower bench, bed side commode, and Grab bars, bed rail   PLOF:  Independent with basic ADLs, spouse and pt shared IADL responsibilities prior to R shoulder fx in 2023  PATIENT GOALS: I would like to  get better movement in my shoulder.   NEXT MD VISIT: Pt unsure, Not for awhile.  OBJECTIVE:  Note: Objective measures were completed at Evaluation unless otherwise noted.  HAND DOMINANCE: Right  ADLs: Overall ADLs: spouse provides assist as needed.  Pt reports, He's doing a lot now.  Transfers/ambulation related to ADLs: supv-modified indep with AD Eating: indep  Grooming: uses L non-dominant hand to brush hair; able to use R dominant hand to brush teeth with electric toothbrush Upper body dressing: set up-modified indep with donning sports bra and shirt overhead  Lower body dressing: occasional help with clothing set up, extra time and effort to don socks, slip on shoes, indep with panties and pants  Toileting: 3in1 over toilet, uses L hand at baseline for pericare; has tried a toilet wand in the past, but minimally effective  Bathing: spouse helps to wash hair and back, spouse helps to wash buttocks Tub shower transfers: supv-min A for lifting legs over tub shower with pt using transfer tub bench and grab bar  Equipment: see above  UPPER EXTREMITY ROM:     Active ROM  Left Eval WNL throughout Right eval Right 06/13/24  Shoulder flexion  30 (155) 40 (155)  Shoulder abduction  51 (153) 55 (155)  Shoulder adduction     Shoulder extension     Shoulder internal rotation  ~thumb to L4-5 (R side) ~thumb to L4-5 (middle)  Shoulder external rotation  5 (90) arm held in 90 abd by OT 5 (90) arm held in 90 abd by OT  Elbow flexion     Elbow extension     Wrist flexion     Wrist extension     Wrist ulnar deviation     Wrist radial deviation     Wrist pronation     Wrist supination     (Blank rows = not tested)  UPPER EXTREMITY MMT:     MMT Left eval Right eval Right 06/13/24  Shoulder flexion 4+ 2 2  Shoulder abduction 4+ 2 2  Shoulder adduction     Shoulder extension     Shoulder internal rotation 4+ 4- 4  Shoulder external rotation 4- 2 2  Middle trapezius     Lower  trapezius     Elbow flexion 4+ 4 4+  Elbow extension 4+ 5 5  Wrist flexion 4+ 5 5  Wrist extension 4+ 5 5  Wrist ulnar deviation     Wrist radial deviation     Wrist pronation     Wrist supination     (Blank rows = not tested)  HAND FUNCTION: Grip strength: Right: 37 lbs; Left: 37 lbs, Lateral pinch: Right: 13 lbs, Left: 9 lbs, and 3 point pinch: Right: 9 lbs, Left: 6 lbs 06/13/24: Grip strength: Right: 35 lbs, Left: 35 lbs  COORDINATION: NT at eval d/t time constraints; will test in upcoming sessions if indicated  05/10/24: 9 hole peg test L 36, R 40  SENSATION:  WFL  EDEMA: No visible edema  COGNITION: Overall cognitive status: History of cognitive impairments - at baseline, decreased memory, hx of bipolar disorder   OBSERVATIONS:  Pt pleasant, cooperative, verbalizes eagerness to improve R shoulder mobility.  TREATMENT DATE: 06/25/24  Therapeutic Exercise: -Continued focus on increasing indep with HEP for RUE strengthening: -RUE AAROM (no theraband) for shoulder flex, abd, ER with elbow at side; min A from OT to depress R shoulder and maintain elbow extension during flex/abd x2 sets 10 reps each 5 sets 10 reps -Yellow theraband for R elbow flex, seated row, and chest press; min vc and tactile cues to maintain form/technique; 3 sets 10 reps  Self Care: -Review of compensatory strategies/AE, positioning for increasing indep with shampooing/rinsing hair in shower -Review of hair care strategies to place barrett in hair, propping RUE on table top to support shoulder -Reinforced benefits of pt trying above strategies to identify problem areas or effective solutions in order to achieve end goal of maximizing indep with daily tasks, while also working to improve strength and activity tolerance as a result of increased ADL participation. -HEP review  PATIENT EDUCATION: Education details:  ADL strategies/AE, HEP Person educated: Patient Education method: Explanation, vc Education comprehension: verbalized understanding, further training needed  HOME EXERCISE PROGRAM: Access Code: [email or web address] URL: [email or web address] Date: 05/28/2024 Prepared by: Inocente Blazing  Exercises - Seated Shoulder Row with Resistance Anchored at Feet  - 1 x daily - 3-5 x weekly - 2-3 sets - 10 reps - Seated Chest Press with Resistance Band  - 1 x daily - 3-5 x weekly - 2-3 sets - 10 reps - Seated Elbow Flexion with Self-Anchored Resistance  - 1 x daily - 3-5 x weekly - 2-3 sets - 10 reps - Standing Shoulder Flexion to 90 Degrees  - 1 x daily - 3-5 x weekly - 2-3 sets - 10 reps - Shoulder Abduction - Thumbs Up  - 1 x daily - 3-5 x weekly - 2-3 sets - 10 reps - Shoulder External Rotation and Scapular Retraction with Resistance  - 1 x daily - 3-5 x weekly - 2-3 sets - 10 reps - Standing Shoulder Internal Rotation with Anchored Resistance  - 1 x daily - 7 x weekly - 3 sets - 10 reps   AAROM  for shoulder flexion, and abduction at the tabletop surface.  GOALS: Goals reviewed with patient? Yes  SHORT TERM GOALS: Target date: 06/18/24  Pt will be indep with HEP for increasing strength and ROM in the R shoulder. Baseline: Eval: To be initiated in upcoming sessions; 06/13/24: Min-mod vc to follow written handout for RUE strengthening Goal status: in progress   2.  Pt will identify and implement 2-3 activity modifications/AE in order to compensate for RUE weakness in order to maximize indep with BADLs. Baseline: Eval: Educ not yet initiated; 06/13/24: Hair care strategies reviewed last session, though pt reports no practice yet at home; initiated education on long handled AE to ease bathing and toileting ADLs this date  Goal status: in progress  LONG TERM GOALS: Target date: 07/30/24  1.  Pt will increase R active shoulder flexion and abd to 70 degrees or better to better engage the RUE into  BADLs. Baseline: Eval: R shoulder flex 30, abd 51; 06/13/24: flex 40, abd 55 Goal status: in progress   2.  Pt will increase active R shoulder ER to 30 degrees or better to enable brushing/combing R side of head with R dominant hand and arm propped as needed. Baseline: Eval: Pt brushes hair with L non-dominant arm; active R shoulder ER 5* with arm propped into abd; 06/13/24: no change from eval Goal status: in progress   3.  Pt will increase active R shoulder IR for increased thoroughness with posterior LB bathing. Baseline: Eval: ~thumb to L4-5; pt reports spouse has to help with posterior in shower; 06/13/24: spouse continues to assist; would benefit from long handled sponge; pt does demo improved reach to mid lower back, previously R side of lower back  Goal status: in progress  3. Pt will increase R shoulder strength by 1/2 muscle grade or more in order to better engage the RUE into BADLs.  Baseline: Eval: R shoulder grossly 2/5; 06/13/24: 1/2 grade improvement with IR and biceps only  Goal status: in progress  ASSESSMENT: CLINICAL IMPRESSION: Pt continues to verbalize desire to increase level of indep with daily tasks, but denied attempts over the weekend at trying activity modifications or positioning strategies that have been reviewed in OT which may foster increased indep with daily tasks.  OT continues to encourage pt to increase participation in ADLs  by accepting less automatic assistance from spouse, allowing spouse to assist only after pt makes her own attempts at task completion.  Pt is able to verbalize compensatory strategies and activity modifications, but has yet to implement.  Pt continues to require min-mod A for HEP, with pt reporting that she tries to do these exercises at home but she can't do all of them.  OT continues to review HEP each visit and continues to add written cues for performing each exercise with correct form and technique within her available ROM/strength levels.   Pt. continues to benefit from skilled OT services to maximize safety and independence in necessary daily tasks.  Anticipate readiness to d/c OT within the next couple of weeks as pt is likely nearing max rehab potential.    PERFORMANCE DEFICITS: in functional skills including ADLs, IADLs, coordination, ROM, strength, pain, flexibility, Gross motor control, mobility, balance, body mechanics, decreased knowledge of precautions, decreased knowledge of use of DME, and UE functional use, cognitive skills including memory, and psychosocial skills including coping strategies, environmental adaptation, habits, and routines and behaviors.   IMPAIRMENTS: are limiting patient from ADLs, IADLs, and leisure.   COMORBIDITIES: has co-morbidities such as bipolar disorder, CKD, HTN, DM2, arthritis that affects occupational performance. Patient will benefit from skilled OT to address above impairments and improve overall function.  MODIFICATION OR ASSISTANCE TO COMPLETE EVALUATION: Min-Moderate modification of tasks or assist with assess necessary to complete an evaluation.  OT OCCUPATIONAL PROFILE AND HISTORY: Detailed assessment: Review of records and additional review of physical, cognitive, psychosocial history related to current functional performance.  CLINICAL DECISION MAKING: Moderate - several treatment options, min-mod task modification necessary  REHAB POTENTIAL: Fair based on limited participation last episode of OT in 2023  EVALUATION COMPLEXITY: Moderate  PLAN:  OT FREQUENCY: 2x/week  OT DURATION: 12 weeks  PLANNED INTERVENTIONS: 97168 OT Re-evaluation, 97535 self care/ADL training, 02889 therapeutic exercise, 97530 therapeutic activity, 97140 manual therapy, 97010 moist heat, 97010 cryotherapy, 97750 Physical Performance Testing, passive range of motion, psychosocial skills training, energy conservation, coping strategies training, patient/family education, and DME and/or AE  instructions  RECOMMENDED OTHER SERVICES: None at this time (Pt currently seeing PT for mobility deficits)  CONSULTED AND AGREED WITH PLAN OF CARE: Patient  PLAN FOR NEXT SESSION: see above   Inocente Blazing, MS, OTR/L  06/27/2024, 2:00 PM

## 2024-06-28 ENCOUNTER — Ambulatory Visit

## 2024-06-28 DIAGNOSIS — R2689 Other abnormalities of gait and mobility: Secondary | ICD-10-CM

## 2024-06-28 DIAGNOSIS — M6281 Muscle weakness (generalized): Secondary | ICD-10-CM | POA: Diagnosis not present

## 2024-06-28 DIAGNOSIS — R2681 Unsteadiness on feet: Secondary | ICD-10-CM

## 2024-06-28 DIAGNOSIS — G8929 Other chronic pain: Secondary | ICD-10-CM

## 2024-06-28 DIAGNOSIS — R262 Difficulty in walking, not elsewhere classified: Secondary | ICD-10-CM

## 2024-06-28 DIAGNOSIS — R278 Other lack of coordination: Secondary | ICD-10-CM

## 2024-06-28 NOTE — Therapy (Signed)
 OUTPATIENT PHYSICAL THERAPY TREATMENT  Patient Name: Stacey Ball MRN: [id number] DOB:12-03-1960, 63 y.o., female Today's Date: 06/28/2024  PCP: Alla Amis MD REFERRING PROVIDER: Jonette, Minor  END OF SESSION:     PT End of Session - 06/28/24 1108     Visit Number 17    Number of Visits 24    Date for PT Re-Evaluation 07/19/24    Authorization Type H[employer] PPO    Progress Note Due on Visit 10    PT Start Time 1107    PT Stop Time 1145    PT Time Calculation (min) 38 min    Equipment Utilized During Treatment Gait belt    Activity Tolerance Patient tolerated treatment well;Patient limited by fatigue    Behavior During Therapy WFL for tasks assessed/performed          Past Medical History:  Diagnosis Date   Anemia    HX of   Arthritis    Bipolar 1 disorder (HCC)    Cancer (HCC)    skin cancer   Cataracts, bilateral    Diabetes mellitus without complication (HCC)    Dyspnea    on exertion (Pt states, out of shape)   Function kidney decreased    High cholesterol    Hypertension    Hypothyroidism    Psoriasis    Psoriatic arthritis (HCC)    Thyroid  disease    Past Surgical History:  Procedure Laterality Date   CATARACT EXTRACTION W/PHACO Left 01/10/2020   Procedure: CATARACT EXTRACTION PHACO AND INTRAOCULAR LENS PLACEMENT (IOC) LEFT DIABETIC;  Surgeon: Ferol Rogue, MD;  Location: [facility];  Service: Ophthalmology;  Laterality: Left;  5.32 0:41.5   CATARACT EXTRACTION W/PHACO Right 02/07/2020   Procedure: CATARACT EXTRACTION PHACO AND INTRAOCULAR LENS PLACEMENT (IOC) RIGHT DIABETIC VISION BLUE 4.01 00:27.4;  Surgeon: Ferol Rogue, MD;  Location: [facility];  Service: Ophthalmology;  Laterality: Right;  Diabetic - oral meds   COLONOSCOPY WITH PROPOFOL  N/A 03/04/2016   Procedure: COLONOSCOPY WITH PROPOFOL ;  Surgeon: Lamar ONEIDA Holmes, MD;  Location: [facility];  Service: Endoscopy;  Laterality: N/A;   great toe amputation      matrixectomy     TONSILLECTOMY     Patient Active Problem List   Diagnosis Date Noted   Hypothyroidism 03/26/2022   UTI (urinary tract infection) 03/26/2022   Pneumonia due to COVID-19 virus 10/22/2020   Bipolar disorder (HCC) 10/22/2020   Acute encephalopathy 10/22/2020   Benign hypertensive kidney disease with chronic kidney disease 07/11/2019   Secondary hyperparathyroidism of renal origin (HCC) 07/11/2019   Class 1 obesity due to excess calories with serious comorbidity and body mass index (BMI) of 32.0 to 32.9 in adult 05/09/2019   Type 2 diabetes mellitus with stage 3 chronic kidney disease, without long-term current use of insulin  (HCC) 09/16/2017   Stage 3 chronic kidney disease (HCC) 10/04/2016   Essential hypertriglyceridemia 03/22/2016   Essential hypertension 03/17/2016   Past Surgical History:  Procedure Laterality Date   CATARACT EXTRACTION W/PHACO Left 01/10/2020   Procedure: CATARACT EXTRACTION PHACO AND INTRAOCULAR LENS PLACEMENT (IOC) LEFT DIABETIC;  Surgeon: Ferol Rogue, MD;  Location: [facility];  Service: Ophthalmology;  Laterality: Left;  5.32 0:41.5   CATARACT EXTRACTION W/PHACO Right 02/07/2020   Procedure: CATARACT EXTRACTION PHACO AND INTRAOCULAR LENS PLACEMENT (IOC) RIGHT DIABETIC VISION BLUE 4.01 00:27.4;  Surgeon: Ferol Rogue, MD;  Location: [facility];  Service: Ophthalmology;  Laterality: Right;  Diabetic - oral meds   COLONOSCOPY WITH PROPOFOL   N/A 03/04/2016   Procedure: COLONOSCOPY WITH PROPOFOL ;  Surgeon: Lamar ONEIDA Holmes, MD;  Location: [facility];  Service: Endoscopy;  Laterality: N/A;   great toe amputation     matrixectomy     TONSILLECTOMY      Past Medical History:  Diagnosis Date   Anemia    HX of   Arthritis    Bipolar 1 disorder (HCC)    Cancer (HCC)    skin cancer   Cataracts, bilateral    Diabetes mellitus without complication (HCC)    Dyspnea    on exertion (Pt states, out of shape)   Function kidney  decreased    High cholesterol    Hypertension    Hypothyroidism    Psoriasis    Psoriatic arthritis (HCC)    Thyroid  disease     ONSET DATE: 2 years ago  REFERRING DIAG: Chronic right shoulder pain [M25.511, G89.29], Leg weakness, bilateral [R29.898]   THERAPY DIAG:   Muscle weakness (generalized)  Other lack of coordination  Chronic right shoulder pain  Difficulty in walking, not elsewhere classified  Other abnormalities of gait and mobility  Unsteadiness on feet  Rationale for Evaluation and Treatment: Rehabilitation  SUBJECTIVE:                                                                                                                                                                                             SUBJECTIVE STATEMENT:    Pt reports that she was very sore after the last session and just felt worn out.  Pt notes she had to take extra strength tylenol .    PERTINENT HISTORY:  Pt presents to clinic today in transport chair. Pt has had consistent falls for the last 2 years, but she reports not having fallen in the last 6 months. Pt uses a 4WW at home c the brakes constantly on to avoid the rollator rolling outside her BOS. Pt stated she does not like 2WW as she fell over one when attempting to rush to the toilet one night. Pt states she has dizzy spells, particularly when coming from a sit to stand or with head turns during gait. Pt also stated her dizziness has tapered off recently as well. She reports all these started around when she was hospitalized for covid and PNA 2 years ago. Pt has a bed rail in her bed. Installed it after falling out of bed multiple times. Pt has very limited shoulder ROM. She reports her husband helps around a lot at home with ADL and IADL like washing her hair. However, she feels his help is prohibiting her from getting better  and requested us  to explain to him that she needs to attempt more tasks on her own.  PAIN:  Are you having pain?  No   PRECAUTIONS: Fall  WEIGHT BEARING RESTRICTIONS: No  FALLS: Has patient fallen in last 6 months? No  LIVING ENVIRONMENT: Lives with: lives with their spouse Lives in: House/apartment Stairs: No Has following equipment at home: Single point cane, Environmental consultant - 2 wheeled, and Environmental consultant - 4 wheeled  PLOF: Independent  PATIENT GOALS: Would like to walk without AD, Work up strength in L and R arm.   OBJECTIVE:  Note: Objective measures were completed at evaluation unless otherwise noted.                                                                                                                             TREATMENT DATE: 06/28/24  TherAct: To improve functional movements patterns for everyday tasks:  AMB overground 129ft 70m 46sec, 3lb AW with rollator, minGuardA to supervision  AMB overground 129ft 45m 32sec, 3lb AW with RW minGuardA to supervision AMB overground 135ft 45m 31sec, 3lb AW with RW minGuardA to supervision AMB overground 355ft in 32m 36 sec, then requested for seated rest break, and continued to ambulate 214' for 69m37s 3lb AW with RW minGuardA to supervision  Pt performed the tasks above in order to improve endurance levels and cardiovascular function necessary for community ambulation attempts outside of the clinic.   PATIENT EDUCATION:  Education details: POC  Person educated: Patient  Education method: Explanation  Education comprehension: verbalized understanding  HOME EXERCISE PROGRAM: Access Code: [email or web address] URL: [email or web address] Date: 05/17/2024 Prepared by: Peggye Linear  Exercises - Sit to Stand with Counter Support  - 1 x daily - 2 sets - 10 reps - Standing March with Counter Support  - 1 x daily - 3 sets - 12 reps - Seated Scapular Retraction  - 1 x daily - 3 sets - 12 reps - Heel Raises with Counter Support  - 1 x daily - 3 sets - 10 reps   GOALS: Goals reviewed with patient? Yes  SHORT TERM GOALS: Target date:  07/19/2024  Consistently perform HEP at least 4x/week to maintain progress made in PT to improve function and overall QoL. Baseline: Initiated on 04/30/2024 05/28/24: updated on 05/17/24 Goal status: IN PROGRESS   LONG TERM GOALS: Target date: 07/19/2024  Pt will decrease her STS time by 10 seconds to improve power and strength which will increase QoL.  Baseline: 33.69 sec BUE support 05/28/24: 29.18 sec w/ BUE support at armrests; 38.34 w/ BUEs at knees Goal status: IN PROGRESS  2.  Pt will decrease her TUG time by 10 seconds to reduce risk of falls and increase QoL.  Baseline: 42.64 sec with rollator 05/28/24: 29.37 sec with RW Goal status: MET   3.  Pt will decrease time by 0.25 m/s in order to increase gait speed to reduce fall risk. Baseline: 04/30/2024: 0.35 m/s using her  personal rollator with brakes locked and close SBA/CGA for safety 05/28/24: Average Normal speed: 0.45 m/s with RW, CGA  Goal status: IN PROGRESS  4.  Pt will increase distance walked during to aid with community ambulation and reduce fall risk. Baseline: 04/30/2024: 103 feet, had to stop at 48min40seconds due to fatigue, (31.39 meters, Avg speed 0.53m/s) using her personal rollator with brakes locked and CGA for safety 05/28/24: 206 ft. 62.78 meters, Avg speed 0.6m/s) using a RW with skilled CGA.  Goal status: IN PROGRESS  5.  Pt will increase ABC score to 80% to increase her confidence with daily activities and improve overall QoL. Baseline: 30%; 05/28/24: 54.375%  Goal status: MET & upgraded on 8/11 from 50% > 80%   ASSESSMENT:  CLINICAL IMPRESSION:    Pt performed well with the exercises and put forth great effort with the longer ambulation attempts.  Pt noted she was feeling fatigued with the longer walk around the gym/EVS/cafeteria and was unable to complete the entire loop before needing a seated rest break.  Pt will continue to improve overall tolerance to endurance strengthening as therapy  progresses.   Pt will continue to benefit from skilled therapy to address remaining deficits in order to improve overall QoL and return to PLOF.     OBJECTIVE IMPAIRMENTS: Abnormal gait, decreased activity tolerance, decreased balance, decreased coordination, decreased knowledge of use of DME, decreased mobility, difficulty walking, decreased ROM, decreased strength, dizziness, and improper body mechanics.   ACTIVITY LIMITATIONS: carrying, lifting, bending, standing, squatting, transfers, bed mobility, bathing, and locomotion level  PARTICIPATION LIMITATIONS: meal prep, cleaning, laundry, medication management, driving, shopping, community activity, and yard work  PERSONAL FACTORS: Past/current experiences, Time since onset of injury/illness/exacerbation, and 1-2 comorbidities:   are also affecting patient's functional outcome.   REHAB POTENTIAL: Good  CLINICAL DECISION MAKING: Evolving/moderate complexity  EVALUATION COMPLEXITY: Moderate  PLAN:  PT FREQUENCY: 2x/week  PT DURATION: 12 weeks  PLANNED INTERVENTIONS: 97110-Therapeutic exercises, 97530- Therapeutic activity, 97112- Neuromuscular re-education, 97535- Self Care, [id number]- Manual therapy, [phone]- Gait training, Patient/Family education, Joint mobilization, and DME instructions  PLAN FOR NEXT SESSION:   Increase ambulation distance to improve endurance, decreased rest breaks Continue sit to stand for BLE strengthening; decreasing reliance on UEs   Fonda Simpers, PT, DPT Physical Therapist - [facility]  [facility]  06/28/24, 5:27 PM

## 2024-07-02 ENCOUNTER — Ambulatory Visit

## 2024-07-02 DIAGNOSIS — M7732 Calcaneal spur, left foot: Secondary | ICD-10-CM | POA: Diagnosis not present

## 2024-07-02 DIAGNOSIS — E1142 Type 2 diabetes mellitus with diabetic polyneuropathy: Secondary | ICD-10-CM | POA: Diagnosis not present

## 2024-07-02 DIAGNOSIS — M722 Plantar fascial fibromatosis: Secondary | ICD-10-CM | POA: Diagnosis not present

## 2024-07-04 ENCOUNTER — Ambulatory Visit

## 2024-07-04 DIAGNOSIS — M6281 Muscle weakness (generalized): Secondary | ICD-10-CM

## 2024-07-04 DIAGNOSIS — G8929 Other chronic pain: Secondary | ICD-10-CM

## 2024-07-04 DIAGNOSIS — R278 Other lack of coordination: Secondary | ICD-10-CM

## 2024-07-04 DIAGNOSIS — M25611 Stiffness of right shoulder, not elsewhere classified: Secondary | ICD-10-CM

## 2024-07-04 NOTE — Therapy (Signed)
 OUTPATIENT OCCUPATIONAL THERAPY ORTHO TREATMENT NOTE  Patient Name: Stacey Ball MRN: [id number] DOB:03-Jan-1961, 63 y.o., female Today's Date: 05/07/2024  PCP: Dr. Alda Helling REFERRING PROVIDER: Dr. Alda Helling  END OF SESSION:   OT End of Session - 07/07/24 1513     Visit Number 13    Number of Visits 24    Date for Recertification  07/30/24    Progress Note Due on Visit 20    OT Start Time 1413    OT Stop Time 1445    OT Time Calculation (min) 32 min    Equipment Utilized During Treatment RW    Activity Tolerance Patient tolerated treatment well    Behavior During Therapy WFL for tasks assessed/performed          Past Medical History:  Diagnosis Date   Anemia    HX of   Arthritis    Bipolar 1 disorder (HCC)    Cancer (HCC)    skin cancer   Cataracts, bilateral    Diabetes mellitus without complication (HCC)    Dyspnea    on exertion (Pt states, out of shape)   Function kidney decreased    High cholesterol    Hypertension    Hypothyroidism    Psoriasis    Psoriatic arthritis (HCC)    Thyroid  disease    Past Surgical History:  Procedure Laterality Date   CATARACT EXTRACTION W/PHACO Left 01/10/2020   Procedure: CATARACT EXTRACTION PHACO AND INTRAOCULAR LENS PLACEMENT (IOC) LEFT DIABETIC;  Surgeon: Ferol Rogue, MD;  Location: [facility];  Service: Ophthalmology;  Laterality: Left;  5.32 0:41.5   CATARACT EXTRACTION W/PHACO Right 02/07/2020   Procedure: CATARACT EXTRACTION PHACO AND INTRAOCULAR LENS PLACEMENT (IOC) RIGHT DIABETIC VISION BLUE 4.01 00:27.4;  Surgeon: Ferol Rogue, MD;  Location: [facility];  Service: Ophthalmology;  Laterality: Right;  Diabetic - oral meds   COLONOSCOPY WITH PROPOFOL  N/A 03/04/2016   Procedure: COLONOSCOPY WITH PROPOFOL ;  Surgeon: Lamar ONEIDA Holmes, MD;  Location: [facility];  Service: Endoscopy;  Laterality: N/A;   great toe amputation     matrixectomy     TONSILLECTOMY     Patient Active  Problem List   Diagnosis Date Noted   Hypothyroidism 03/26/2022   UTI (urinary tract infection) 03/26/2022   Pneumonia due to COVID-19 virus 10/22/2020   Bipolar disorder (HCC) 10/22/2020   Acute encephalopathy 10/22/2020   Benign hypertensive kidney disease with chronic kidney disease 07/11/2019   Secondary hyperparathyroidism of renal origin (HCC) 07/11/2019   Class 1 obesity due to excess calories with serious comorbidity and body mass index (BMI) of 32.0 to 32.9 in adult 05/09/2019   Type 2 diabetes mellitus with stage 3 chronic kidney disease, without long-term current use of insulin  (HCC) 09/16/2017   Stage 3 chronic kidney disease (HCC) 10/04/2016   Essential hypertriglyceridemia 03/22/2016   Essential hypertension 03/17/2016   ONSET DATE: 03/26/22  REFERRING DIAG: M25.511,G89.29 (ICD-10-CM) - Chronic right shoulder pain   THERAPY DIAG:  Muscle weakness (generalized)  Other lack of coordination  Chronic right shoulder pain  Rationale for Evaluation and Treatment: Rehabilitation  SUBJECTIVE:  SUBJECTIVE STATEMENT: Pt reports she and her husband were running late today, but agreeable to shortened tx session. Pt accompanied by: self  PERTINENT HISTORY:  Pt returns today to restart therapy for her R shoulder. Pt was seen by this therapist in 2023 post R proximal humerus fx, non-surgical.  Pt attended 4 OT visits in 2023, but then self discharged, though she doesn't  recall her reasoning for stopping. Pt did admit that she was stubborn, and should have kept going, as she remains quite limited with her shoulder ROM. Pt also currently being seen by outpatient PT  to address balance/mobility deficits d/t hx of falling.  Per chart on 03/26/22, 63 y/o old female with PMH significant for bipolar disorder, anemia, cataracts, hyperlipidemia, hypertension, hypothyroidism, obesity, type 2 diabetes, CKD stage IIIb presented in the ED with c/o: generalized  weakness and recurrent falls. Patient  has generalized weakness for some time which has gotten worse.  She had a fall yesterday and was evaluated outpatient and found to have right humeral fracture and was placed in a sling.  Patient had additional fall yesterday, denies any loss of consciousness or head trauma or head injury. In the ED work-up revealed UA positive for UTI.  Lactic acid normal.  x-ray right shoulder shows humeral neck fracture. ED physician spoke with orthopedics to discuss need for urgent evaluation.  Patient was admitted for generalized weakness secondary to UTI,  started on IV antibiotics.  Orthopedics consulted,  recommended conservative management. Patient is not a candidate for surgical intervention.  Advised outpatient follow-up.  Patient completed antibiotics for 3 days. PT recommended  skilled nursing facility for rehab.  Patient feels better and want to be discharged.  Patient being discharged skilled nursing facility for rehab.   PRECAUTIONS: Fall  RED FLAGS: None   WEIGHT BEARING RESTRICTIONS: No  PAIN: 07/04/24: occasional R shoulder pain, 1-3/10 pain with activity 0/10 pain at rest or activity in R shoulder, but does report pain in bilat knees and low back  Are you having pain? Yes: NPRS scale: 6/10  Pain location: low back and bilat knees Pain description: achy Aggravating factors: prolonged standing/walking Relieving factors: rest, Tylenol   FALLS: Has patient fallen in last 6 months? Yes. Number of falls pt unsure, but reports 6 falls, but within more than 6 months   LIVING ENVIRONMENT: Lives with: lives with their spouse Lives in: first floor apt  Stairs: No Has following equipment at home: Counselling psychologist, Environmental consultant - 2 wheeled, Environmental consultant - 4 wheeled, Wheelchair (manual), Shower bench, bed side commode, and Grab bars, bed rail   PLOF:  Independent with basic ADLs, spouse and pt shared IADL responsibilities prior to R shoulder fx in 2023  PATIENT GOALS: I would like to get better movement in my  shoulder.   NEXT MD VISIT: Pt unsure, Not for awhile.  OBJECTIVE:  Note: Objective measures were completed at Evaluation unless otherwise noted.  HAND DOMINANCE: Right  ADLs: Overall ADLs: spouse provides assist as needed.  Pt reports, He's doing a lot now.  Transfers/ambulation related to ADLs: supv-modified indep with AD Eating: indep  Grooming: uses L non-dominant hand to brush hair; able to use R dominant hand to brush teeth with electric toothbrush Upper body dressing: set up-modified indep with donning sports bra and shirt overhead  Lower body dressing: occasional help with clothing set up, extra time and effort to don socks, slip on shoes, indep with panties and pants  Toileting: 3in1 over toilet, uses L hand at baseline for pericare; has tried a toilet wand in the past, but minimally effective  Bathing: spouse helps to wash hair and back, spouse helps to wash buttocks Tub shower transfers: supv-min A for lifting legs over tub shower with pt using transfer tub bench and grab bar  Equipment: see above  UPPER EXTREMITY ROM:     Active ROM Left Eval WNL  throughout Right eval Right 06/13/24  Shoulder flexion  30 (155) 40 (155)  Shoulder abduction  51 (153) 55 (155)  Shoulder adduction     Shoulder extension     Shoulder internal rotation  ~thumb to L4-5 (R side) ~thumb to L4-5 (middle)  Shoulder external rotation  5 (90) arm held in 90 abd by OT 5 (90) arm held in 90 abd by OT  Elbow flexion     Elbow extension     Wrist flexion     Wrist extension     Wrist ulnar deviation     Wrist radial deviation     Wrist pronation     Wrist supination     (Blank rows = not tested)  UPPER EXTREMITY MMT:     MMT Left eval Right eval Right 06/13/24  Shoulder flexion 4+ 2 2  Shoulder abduction 4+ 2 2  Shoulder adduction     Shoulder extension     Shoulder internal rotation 4+ 4- 4  Shoulder external rotation 4- 2 2  Middle trapezius     Lower trapezius     Elbow  flexion 4+ 4 4+  Elbow extension 4+ 5 5  Wrist flexion 4+ 5 5  Wrist extension 4+ 5 5  Wrist ulnar deviation     Wrist radial deviation     Wrist pronation     Wrist supination     (Blank rows = not tested)  HAND FUNCTION: Grip strength: Right: 37 lbs; Left: 37 lbs, Lateral pinch: Right: 13 lbs, Left: 9 lbs, and 3 point pinch: Right: 9 lbs, Left: 6 lbs 06/13/24: Grip strength: Right: 35 lbs, Left: 35 lbs  COORDINATION: NT at eval d/t time constraints; will test in upcoming sessions if indicated  05/10/24: 9 hole peg test L 36, R 40  SENSATION:  WFL  EDEMA: No visible edema  COGNITION: Overall cognitive status: History of cognitive impairments - at baseline, decreased memory, hx of bipolar disorder   OBSERVATIONS:  Pt pleasant, cooperative, verbalizes eagerness to improve R shoulder mobility.  TREATMENT DATE: 07/04/24  Therapeutic Exercise: -UBE x7 min: pt alternated between forward and reverse rotations with mild-moderate resistance forward, mild resistance reverse rotation, constant monitoring for tension adjustments and min-mod vc for minimizing R shoulder hike. -Reps of R/L active IR behind back, working to increase reach for peri hygiene.  Therapeutic Activity: -Facilitated forward and lateral reaching patterns with active assist to move Saebo rings on/off levels 1-2 of Saebo tower.  OT provided proximal and distal stability and intermittent min A to depress R shoulder at all levels of reaching.                                                                                             PATIENT EDUCATION: Education details: Advised on Insurance account manager for increasing indep with peri hygiene Person educated: Patient Education method: Explanation, vc Education comprehension: verbalized understanding  HOME EXERCISE PROGRAM: Access Code: [email or web address] URL: [email or web address] Date: 05/28/2024 Prepared by: Inocente Blazing  Exercises - Seated Shoulder Row with  Resistance Anchored at Feet  - 1 x daily - 3-5 x weekly -  2-3 sets - 10 reps - Seated Chest Press with Resistance Band  - 1 x daily - 3-5 x weekly - 2-3 sets - 10 reps - Seated Elbow Flexion with Self-Anchored Resistance  - 1 x daily - 3-5 x weekly - 2-3 sets - 10 reps - Standing Shoulder Flexion to 90 Degrees  - 1 x daily - 3-5 x weekly - 2-3 sets - 10 reps - Shoulder Abduction - Thumbs Up  - 1 x daily - 3-5 x weekly - 2-3 sets - 10 reps - Shoulder External Rotation and Scapular Retraction with Resistance  - 1 x daily - 3-5 x weekly - 2-3 sets - 10 reps - Standing Shoulder Internal Rotation with Anchored Resistance  - 1 x daily - 7 x weekly - 3 sets - 10 reps   AAROM  for shoulder flexion, and abduction at the tabletop surface.  GOALS: Goals reviewed with patient? Yes  SHORT TERM GOALS: Target date: 06/18/24  Pt will be indep with HEP for increasing strength and ROM in the R shoulder. Baseline: Eval: To be initiated in upcoming sessions; 06/13/24: Min-mod vc to follow written handout for RUE strengthening Goal status: in progress   2.  Pt will identify and implement 2-3 activity modifications/AE in order to compensate for RUE weakness in order to maximize indep with BADLs. Baseline: Eval: Educ not yet initiated; 06/13/24: Hair care strategies reviewed last session, though pt reports no practice yet at home; initiated education on long handled AE to ease bathing and toileting ADLs this date  Goal status: in progress  LONG TERM GOALS: Target date: 07/30/24  1.  Pt will increase R active shoulder flexion and abd to 70 degrees or better to better engage the RUE into BADLs. Baseline: Eval: R shoulder flex 30, abd 51; 06/13/24: flex 40, abd 55 Goal status: in progress   2.  Pt will increase active R shoulder ER to 30 degrees or better to enable brushing/combing R side of head with R dominant hand and arm propped as needed. Baseline: Eval: Pt brushes hair with L non-dominant arm; active R  shoulder ER 5* with arm propped into abd; 06/13/24: no change from eval Goal status: in progress   3.  Pt will increase active R shoulder IR for increased thoroughness with posterior LB bathing. Baseline: Eval: ~thumb to L4-5; pt reports spouse has to help with posterior in shower; 06/13/24: spouse continues to assist; would benefit from long handled sponge; pt does demo improved reach to mid lower back, previously R side of lower back  Goal status: in progress  3. Pt will increase R shoulder strength by 1/2 muscle grade or more in order to better engage the RUE into BADLs.  Baseline: Eval: R shoulder grossly 2/5; 06/13/24: 1/2 grade improvement with IR and biceps only  Goal status: in progress  ASSESSMENT: CLINICAL IMPRESSION: Shortened tx session d/t pt arriving late.  Good tolerance to BUE UBE and active assist for forward and lateral reaching patterns on levels 1-2 on Saebo tower.  Pt receptive to bidet/hose attachments for sink/toilet to increase indep with peri hygiene d/t difficulty reaching posteriorly, even with the L unaffected side.  Pt continues to present with significant shoulder hiking when reaching in any direction with the RUE.  Pt. continues to benefit from skilled OT services to maximize safety and independence in necessary daily tasks.  Anticipate readiness to d/c OT within the next couple of weeks as pt is likely nearing max rehab potential.  PERFORMANCE DEFICITS: in functional skills including ADLs, IADLs, coordination, ROM, strength, pain, flexibility, Gross motor control, mobility, balance, body mechanics, decreased knowledge of precautions, decreased knowledge of use of DME, and UE functional use, cognitive skills including memory, and psychosocial skills including coping strategies, environmental adaptation, habits, and routines and behaviors.   IMPAIRMENTS: are limiting patient from ADLs, IADLs, and leisure.   COMORBIDITIES: has co-morbidities such as bipolar disorder,  CKD, HTN, DM2, arthritis that affects occupational performance. Patient will benefit from skilled OT to address above impairments and improve overall function.  MODIFICATION OR ASSISTANCE TO COMPLETE EVALUATION: Min-Moderate modification of tasks or assist with assess necessary to complete an evaluation.  OT OCCUPATIONAL PROFILE AND HISTORY: Detailed assessment: Review of records and additional review of physical, cognitive, psychosocial history related to current functional performance.  CLINICAL DECISION MAKING: Moderate - several treatment options, min-mod task modification necessary  REHAB POTENTIAL: Fair based on limited participation last episode of OT in 2023  EVALUATION COMPLEXITY: Moderate  PLAN:  OT FREQUENCY: 2x/week  OT DURATION: 12 weeks  PLANNED INTERVENTIONS: 97168 OT Re-evaluation, 97535 self care/ADL training, 02889 therapeutic exercise, 97530 therapeutic activity, 97140 manual therapy, 97010 moist heat, 97010 cryotherapy, 97750 Physical Performance Testing, passive range of motion, psychosocial skills training, energy conservation, coping strategies training, patient/family education, and DME and/or AE instructions  RECOMMENDED OTHER SERVICES: None at this time (Pt currently seeing PT for mobility deficits)  CONSULTED AND AGREED WITH PLAN OF CARE: Patient  PLAN FOR NEXT SESSION: see above   Inocente Blazing, MS, OTR/L  07/07/2024, 3:15 PM

## 2024-07-04 NOTE — Therapy (Incomplete)
 OUTPATIENT PHYSICAL THERAPY TREATMENT  Patient Name: Stacey Ball MRN: [id number] DOB:12-14-1960, 63 y.o., female Today's Date: 07/04/2024  PCP: Alla Amis MD REFERRING PROVIDER: Jonette, Minor  END OF SESSION:       Past Medical History:  Diagnosis Date   Anemia    HX of   Arthritis    Bipolar 1 disorder (HCC)    Cancer (HCC)    skin cancer   Cataracts, bilateral    Diabetes mellitus without complication (HCC)    Dyspnea    on exertion (Pt states, out of shape)   Function kidney decreased    High cholesterol    Hypertension    Hypothyroidism    Psoriasis    Psoriatic arthritis (HCC)    Thyroid  disease    Past Surgical History:  Procedure Laterality Date   CATARACT EXTRACTION W/PHACO Left 01/10/2020   Procedure: CATARACT EXTRACTION PHACO AND INTRAOCULAR LENS PLACEMENT (IOC) LEFT DIABETIC;  Surgeon: Ferol Rogue, MD;  Location: [facility];  Service: Ophthalmology;  Laterality: Left;  5.32 0:41.5   CATARACT EXTRACTION W/PHACO Right 02/07/2020   Procedure: CATARACT EXTRACTION PHACO AND INTRAOCULAR LENS PLACEMENT (IOC) RIGHT DIABETIC VISION BLUE 4.01 00:27.4;  Surgeon: Ferol Rogue, MD;  Location: [facility];  Service: Ophthalmology;  Laterality: Right;  Diabetic - oral meds   COLONOSCOPY WITH PROPOFOL  N/A 03/04/2016   Procedure: COLONOSCOPY WITH PROPOFOL ;  Surgeon: Lamar ONEIDA Holmes, MD;  Location: [facility];  Service: Endoscopy;  Laterality: N/A;   great toe amputation     matrixectomy     TONSILLECTOMY     Patient Active Problem List   Diagnosis Date Noted   Hypothyroidism 03/26/2022   UTI (urinary tract infection) 03/26/2022   Pneumonia due to COVID-19 virus 10/22/2020   Bipolar disorder (HCC) 10/22/2020   Acute encephalopathy 10/22/2020   Benign hypertensive kidney disease with chronic kidney disease 07/11/2019   Secondary hyperparathyroidism of renal origin (HCC) 07/11/2019   Class 1 obesity due to excess calories with serious  comorbidity and body mass index (BMI) of 32.0 to 32.9 in adult 05/09/2019   Type 2 diabetes mellitus with stage 3 chronic kidney disease, without long-term current use of insulin  (HCC) 09/16/2017   Stage 3 chronic kidney disease (HCC) 10/04/2016   Essential hypertriglyceridemia 03/22/2016   Essential hypertension 03/17/2016   Past Surgical History:  Procedure Laterality Date   CATARACT EXTRACTION W/PHACO Left 01/10/2020   Procedure: CATARACT EXTRACTION PHACO AND INTRAOCULAR LENS PLACEMENT (IOC) LEFT DIABETIC;  Surgeon: Ferol Rogue, MD;  Location: [facility];  Service: Ophthalmology;  Laterality: Left;  5.32 0:41.5   CATARACT EXTRACTION W/PHACO Right 02/07/2020   Procedure: CATARACT EXTRACTION PHACO AND INTRAOCULAR LENS PLACEMENT (IOC) RIGHT DIABETIC VISION BLUE 4.01 00:27.4;  Surgeon: Ferol Rogue, MD;  Location: [facility];  Service: Ophthalmology;  Laterality: Right;  Diabetic - oral meds   COLONOSCOPY WITH PROPOFOL  N/A 03/04/2016   Procedure: COLONOSCOPY WITH PROPOFOL ;  Surgeon: Lamar ONEIDA Holmes, MD;  Location: [facility];  Service: Endoscopy;  Laterality: N/A;   great toe amputation     matrixectomy     TONSILLECTOMY      Past Medical History:  Diagnosis Date   Anemia    HX of   Arthritis    Bipolar 1 disorder (HCC)    Cancer (HCC)    skin cancer   Cataracts, bilateral    Diabetes mellitus without complication (HCC)    Dyspnea    on exertion (Pt states, out of shape)  Function kidney decreased    High cholesterol    Hypertension    Hypothyroidism    Psoriasis    Psoriatic arthritis (HCC)    Thyroid  disease     ONSET DATE: 2 years ago  REFERRING DIAG: Chronic right shoulder pain [M25.511, G89.29], Leg weakness, bilateral [R29.898]   THERAPY DIAG:   No diagnosis found.  Rationale for Evaluation and Treatment: Rehabilitation  SUBJECTIVE:                                                                                                                                                                                              SUBJECTIVE STATEMENT:    ***plantar fascitis ***    Pt reports that she was very sore after the last session and just felt worn out.  Pt notes she had to take extra strength tylenol .    PERTINENT HISTORY:  Pt presents to clinic today in transport chair. Pt has had consistent falls for the last 2 years, but she reports not having fallen in the last 6 months. Pt uses a 4WW at home c the brakes constantly on to avoid the rollator rolling outside her BOS. Pt stated she does not like 2WW as she fell over one when attempting to rush to the toilet one night. Pt states she has dizzy spells, particularly when coming from a sit to stand or with head turns during gait. Pt also stated her dizziness has tapered off recently as well. She reports all these started around when she was hospitalized for covid and PNA 2 years ago. Pt has a bed rail in her bed. Installed it after falling out of bed multiple times. Pt has very limited shoulder ROM. She reports her husband helps around a lot at home with ADL and IADL like washing her hair. However, she feels his help is prohibiting her from getting better and requested us  to explain to him that she needs to attempt more tasks on her own.  PAIN:  Are you having pain? No   PRECAUTIONS: Fall  WEIGHT BEARING RESTRICTIONS: No  FALLS: Has patient fallen in last 6 months? No  LIVING ENVIRONMENT: Lives with: lives with their spouse Lives in: House/apartment Stairs: No Has following equipment at home: Single point cane, Environmental consultant - 2 wheeled, and Environmental consultant - 4 wheeled  PLOF: Independent  PATIENT GOALS: Would like to walk without AD, Work up strength in L and R arm.   OBJECTIVE:  Note: Objective measures were completed at evaluation unless otherwise noted.  TREATMENT  DATE: 07/04/24  TherAct: To improve functional movements patterns for everyday tasks:  AMB overground 19ft 67m 46sec, 3lb AW with rollator, minGuardA to supervision  AMB overground 181ft 39m 32sec, 3lb AW with RW minGuardA to supervision AMB overground 141ft 27m 31sec, 3lb AW with RW minGuardA to supervision AMB overground 351ft in 86m 36 sec, then requested for seated rest break, and continued to ambulate 214' for 29m37s 3lb AW with RW minGuardA to supervision  Pt performed the tasks above in order to improve endurance levels and cardiovascular function necessary for community ambulation attempts outside of the clinic.   PATIENT EDUCATION:  Education details: POC  Person educated: Patient  Education method: Explanation  Education comprehension: verbalized understanding  HOME EXERCISE PROGRAM: Access Code: [email or web address] URL: [email or web address] Date: 05/17/2024 Prepared by: Peggye Linear  Exercises - Sit to Stand with Counter Support  - 1 x daily - 2 sets - 10 reps - Standing March with Counter Support  - 1 x daily - 3 sets - 12 reps - Seated Scapular Retraction  - 1 x daily - 3 sets - 12 reps - Heel Raises with Counter Support  - 1 x daily - 3 sets - 10 reps   GOALS: Goals reviewed with patient? Yes  SHORT TERM GOALS: Target date: 07/19/2024  Consistently perform HEP at least 4x/week to maintain progress made in PT to improve function and overall QoL. Baseline: Initiated on 04/30/2024 05/28/24: updated on 05/17/24 Goal status: IN PROGRESS   LONG TERM GOALS: Target date: 07/19/2024  Pt will decrease her STS time by 10 seconds to improve power and strength which will increase QoL.  Baseline: 33.69 sec BUE support 05/28/24: 29.18 sec w/ BUE support at armrests; 38.34 w/ BUEs at knees Goal status: IN PROGRESS  2.  Pt will decrease her TUG time by 10 seconds to reduce risk of falls and increase QoL.  Baseline: 42.64 sec with rollator 05/28/24: 29.37 sec with RW Goal  status: MET   3.  Pt will decrease time by 0.25 m/s in order to increase gait speed to reduce fall risk. Baseline: 04/30/2024: 0.35 m/s using her personal rollator with brakes locked and close SBA/CGA for safety 05/28/24: Average Normal speed: 0.45 m/s with RW, CGA  Goal status: IN PROGRESS  4.  Pt will increase distance walked during to aid with community ambulation and reduce fall risk. Baseline: 04/30/2024: 103 feet, had to stop at 5min40seconds due to fatigue, (31.39 meters, Avg speed 0.64m/s) using her personal rollator with brakes locked and CGA for safety 05/28/24: 206 ft. 62.78 meters, Avg speed 0.28m/s) using a RW with skilled CGA.  Goal status: IN PROGRESS  5.  Pt will increase ABC score to 80% to increase her confidence with daily activities and improve overall QoL. Baseline: 30%; 05/28/24: 54.375%  Goal status: MET & upgraded on 8/11 from 50% > 80%   ASSESSMENT:  CLINICAL IMPRESSION:    Pt performed well with the exercises and put forth great effort with the longer ambulation attempts.  Pt noted she was feeling fatigued with the longer walk around the gym/EVS/cafeteria and was unable to complete the entire loop before needing a seated rest break.  Pt will continue to improve overall tolerance to endurance strengthening as therapy progresses.   Pt will continue to benefit from skilled therapy to address remaining deficits in order to improve overall QoL and return to PLOF.     OBJECTIVE IMPAIRMENTS: Abnormal gait, decreased activity tolerance, decreased balance, decreased coordination,  decreased knowledge of use of DME, decreased mobility, difficulty walking, decreased ROM, decreased strength, dizziness, and improper body mechanics.   ACTIVITY LIMITATIONS: carrying, lifting, bending, standing, squatting, transfers, bed mobility, bathing, and locomotion level  PARTICIPATION LIMITATIONS: meal prep, cleaning, laundry, medication management, driving, shopping, community  activity, and yard work  PERSONAL FACTORS: Past/current experiences, Time since onset of injury/illness/exacerbation, and 1-2 comorbidities:   are also affecting patient's functional outcome.   REHAB POTENTIAL: Good  CLINICAL DECISION MAKING: Evolving/moderate complexity  EVALUATION COMPLEXITY: Moderate  PLAN:  PT FREQUENCY: 2x/week  PT DURATION: 12 weeks  PLANNED INTERVENTIONS: 97110-Therapeutic exercises, 97530- Therapeutic activity, 97112- Neuromuscular re-education, 97535- Self Care, [id number]- Manual therapy, [phone]- Gait training, Patient/Family education, Joint mobilization, and DME instructions  PLAN FOR NEXT SESSION:   Increase ambulation distance to improve endurance, decreased rest breaks Continue sit to stand for BLE strengthening; decreasing reliance on UEs   Fonda Simpers, PT, DPT Physical Therapist - [facility]  [facility]  07/04/24, 3:34 PM

## 2024-07-10 ENCOUNTER — Ambulatory Visit

## 2024-07-10 ENCOUNTER — Ambulatory Visit: Admitting: Physical Therapy

## 2024-07-12 ENCOUNTER — Ambulatory Visit: Admitting: Physical Therapy

## 2024-07-12 ENCOUNTER — Ambulatory Visit

## 2024-07-12 DIAGNOSIS — R278 Other lack of coordination: Secondary | ICD-10-CM

## 2024-07-12 DIAGNOSIS — R2681 Unsteadiness on feet: Secondary | ICD-10-CM

## 2024-07-12 DIAGNOSIS — M25511 Pain in right shoulder: Secondary | ICD-10-CM

## 2024-07-12 DIAGNOSIS — M6281 Muscle weakness (generalized): Secondary | ICD-10-CM

## 2024-07-12 DIAGNOSIS — R2689 Other abnormalities of gait and mobility: Secondary | ICD-10-CM

## 2024-07-12 DIAGNOSIS — R262 Difficulty in walking, not elsewhere classified: Secondary | ICD-10-CM

## 2024-07-12 DIAGNOSIS — M25611 Stiffness of right shoulder, not elsewhere classified: Secondary | ICD-10-CM

## 2024-07-12 NOTE — Therapy (Unsigned)
 OUTPATIENT OCCUPATIONAL THERAPY ORTHO TREATMENT NOTE  Patient Name: Stacey Ball MRN: [id number] DOB:[birth year], 63 y.o., female Today's Date: 05/07/2024  PCP: Dr. Alda Helling REFERRING PROVIDER: Dr. Alda Helling  END OF SESSION:   OT End of Session - 07/12/24 1407     Visit Number 14    Number of Visits 24    Date for Recertification  07/30/24          Past Medical History:  Diagnosis Date   Anemia    HX of   Arthritis    Bipolar 1 disorder (HCC)    Cancer (HCC)    skin cancer   Cataracts, bilateral    Diabetes mellitus without complication (HCC)    Dyspnea    on exertion (Pt states, out of shape)   Function kidney decreased    High cholesterol    Hypertension    Hypothyroidism    Psoriasis    Psoriatic arthritis (HCC)    Thyroid  disease    Past Surgical History:  Procedure Laterality Date   CATARACT EXTRACTION W/PHACO Left 01/10/2020   Procedure: CATARACT EXTRACTION PHACO AND INTRAOCULAR LENS PLACEMENT (IOC) LEFT DIABETIC;  Surgeon: Ferol Rogue, MD;  Location: [facility];  Service: Ophthalmology;  Laterality: Left;  5.32 0:41.5   CATARACT EXTRACTION W/PHACO Right 02/07/2020   Procedure: CATARACT EXTRACTION PHACO AND INTRAOCULAR LENS PLACEMENT (IOC) RIGHT DIABETIC VISION BLUE 4.01 00:27.4;  Surgeon: Ferol Rogue, MD;  Location: [facility];  Service: Ophthalmology;  Laterality: Right;  Diabetic - oral meds   COLONOSCOPY WITH PROPOFOL  N/A 03/04/2016   Procedure: COLONOSCOPY WITH PROPOFOL ;  Surgeon: Lamar ONEIDA Holmes, MD;  Location: [facility];  Service: Endoscopy;  Laterality: N/A;   great toe amputation     matrixectomy     TONSILLECTOMY     Patient Active Problem List   Diagnosis Date Noted   Hypothyroidism 03/26/2022   UTI (urinary tract infection) 03/26/2022   Pneumonia due to COVID-19 virus 10/22/2020   Bipolar disorder (HCC) 10/22/2020   Acute encephalopathy 10/22/2020   Benign hypertensive kidney disease with chronic  kidney disease 07/11/2019   Secondary hyperparathyroidism of renal origin (HCC) 07/11/2019   Class 1 obesity due to excess calories with serious comorbidity and body mass index (BMI) of 32.0 to 32.9 in adult 05/09/2019   Type 2 diabetes mellitus with stage 3 chronic kidney disease, without long-term current use of insulin  (HCC) 09/16/2017   Stage 3 chronic kidney disease (HCC) 10/04/2016   Essential hypertriglyceridemia 03/22/2016   Essential hypertension 03/17/2016   ONSET DATE: 03/26/22  REFERRING DIAG: M25.511,G89.29 (ICD-10-CM) - Chronic right shoulder pain   THERAPY DIAG:  Muscle weakness (generalized)  Other lack of coordination  Chronic right shoulder pain  Rationale for Evaluation and Treatment: Rehabilitation  SUBJECTIVE:  SUBJECTIVE STATEMENT: Pt reports she and her husband were running late today, but agreeable to shortened tx session. Pt accompanied by: self  PERTINENT HISTORY:  Pt returns today to restart therapy for her R shoulder. Pt was seen by this therapist in 2023 post R proximal humerus fx, non-surgical.  Pt attended 4 OT visits in 2023, but then self discharged, though she doesn't recall her reasoning for stopping. Pt did admit that she was stubborn, and should have kept going, as she remains quite limited with her shoulder ROM. Pt also currently being seen by outpatient PT  to address balance/mobility deficits d/t hx of falling.  Per chart on 03/26/22, 63 y/o old female with PMH significant for bipolar disorder, anemia,  cataracts, hyperlipidemia, hypertension, hypothyroidism, obesity, type 2 diabetes, CKD stage IIIb presented in the ED with c/o: generalized  weakness and recurrent falls. Patient has generalized weakness for some time which has gotten worse.  She had a fall yesterday and was evaluated outpatient and found to have right humeral fracture and was placed in a sling.  Patient had additional fall yesterday, denies any loss of consciousness or head trauma or  head injury. In the ED work-up revealed UA positive for UTI.  Lactic acid normal.  x-ray right shoulder shows humeral neck fracture. ED physician spoke with orthopedics to discuss need for urgent evaluation.  Patient was admitted for generalized weakness secondary to UTI,  started on IV antibiotics.  Orthopedics consulted,  recommended conservative management. Patient is not a candidate for surgical intervention.  Advised outpatient follow-up.  Patient completed antibiotics for 3 days. PT recommended  skilled nursing facility for rehab.  Patient feels better and want to be discharged.  Patient being discharged skilled nursing facility for rehab.   PRECAUTIONS: Fall  RED FLAGS: None   WEIGHT BEARING RESTRICTIONS: No  PAIN: 07/04/24: occasional R shoulder pain, 1-3/10 pain with activity 0/10 pain at rest or activity in R shoulder, but does report pain in bilat knees and low back  Are you having pain? Yes: NPRS scale: 6/10  Pain location: low back and bilat knees Pain description: achy Aggravating factors: prolonged standing/walking Relieving factors: rest, Tylenol   FALLS: Has patient fallen in last 6 months? Yes. Number of falls pt unsure, but reports 6 falls, but within more than 6 months   LIVING ENVIRONMENT: Lives with: lives with their spouse Lives in: first floor apt  Stairs: No Has following equipment at home: Counselling psychologist, Environmental consultant - 2 wheeled, Environmental consultant - 4 wheeled, Wheelchair (manual), Shower bench, bed side commode, and Grab bars, bed rail   PLOF:  Independent with basic ADLs, spouse and pt shared IADL responsibilities prior to R shoulder fx in 2023  PATIENT GOALS: I would like to get better movement in my shoulder.   NEXT MD VISIT: Pt unsure, Not for awhile.  OBJECTIVE:  Note: Objective measures were completed at Evaluation unless otherwise noted.  HAND DOMINANCE: Right  ADLs: Overall ADLs: spouse provides assist as needed.  Pt reports, He's doing a lot now.   Transfers/ambulation related to ADLs: supv-modified indep with AD Eating: indep  Grooming: uses L non-dominant hand to brush hair; able to use R dominant hand to brush teeth with electric toothbrush Upper body dressing: set up-modified indep with donning sports bra and shirt overhead  Lower body dressing: occasional help with clothing set up, extra time and effort to don socks, slip on shoes, indep with panties and pants  Toileting: 3in1 over toilet, uses L hand at baseline for pericare; has tried a toilet wand in the past, but minimally effective  Bathing: spouse helps to wash hair and back, spouse helps to wash buttocks Tub shower transfers: supv-min A for lifting legs over tub shower with pt using transfer tub bench and grab bar  Equipment: see above  UPPER EXTREMITY ROM:     Active ROM Left Eval WNL throughout Right eval Right 06/13/24  Shoulder flexion  30 (155) 40 (155)  Shoulder abduction  51 (153) 55 (155)  Shoulder adduction     Shoulder extension     Shoulder internal rotation  ~thumb to L4-5 (R side) ~thumb to L4-5 (middle)  Shoulder external rotation  5 (90) arm held in 90 abd  by OT 5 (90) arm held in 90 abd by OT  Elbow flexion     Elbow extension     Wrist flexion     Wrist extension     Wrist ulnar deviation     Wrist radial deviation     Wrist pronation     Wrist supination     (Blank rows = not tested)  UPPER EXTREMITY MMT:     MMT Left eval Right eval Right 06/13/24  Shoulder flexion 4+ 2 2  Shoulder abduction 4+ 2 2  Shoulder adduction     Shoulder extension     Shoulder internal rotation 4+ 4- 4  Shoulder external rotation 4- 2 2  Middle trapezius     Lower trapezius     Elbow flexion 4+ 4 4+  Elbow extension 4+ 5 5  Wrist flexion 4+ 5 5  Wrist extension 4+ 5 5  Wrist ulnar deviation     Wrist radial deviation     Wrist pronation     Wrist supination     (Blank rows = not tested)  HAND FUNCTION: Grip strength: Right: 37 lbs; Left: 37 lbs,  Lateral pinch: Right: 13 lbs, Left: 9 lbs, and 3 point pinch: Right: 9 lbs, Left: 6 lbs 06/13/24: Grip strength: Right: 35 lbs, Left: 35 lbs  COORDINATION: NT at eval d/t time constraints; will test in upcoming sessions if indicated  05/10/24: 9 hole peg test L 36, R 40  SENSATION:  WFL  EDEMA: No visible edema  COGNITION: Overall cognitive status: History of cognitive impairments - at baseline, decreased memory, hx of bipolar disorder   OBSERVATIONS:  Pt pleasant, cooperative, verbalizes eagerness to improve R shoulder mobility.  TREATMENT DATE: 07/04/24  Therapeutic Exercise: -UBE x7 min: pt alternated between forward and reverse rotations with mild-moderate resistance forward, mild resistance reverse rotation, constant monitoring for tension adjustments and min-mod vc for minimizing R shoulder hike. -Reps of R/L active IR behind back, working to increase reach for peri hygiene.  Therapeutic Activity: -Facilitated forward and lateral reaching patterns with active assist to move Saebo rings on/off levels 1-2 of Saebo tower.  OT provided proximal and distal stability and intermittent min A to depress R shoulder at all levels of reaching.                                                                                             PATIENT EDUCATION: Education details: Advised on Insurance account manager for increasing indep with peri hygiene Person educated: Patient Education method: Explanation, vc Education comprehension: verbalized understanding  HOME EXERCISE PROGRAM: Access Code: [email or web address] URL: [email or web address] Date: 05/28/2024 Prepared by: Inocente Blazing  Exercises - Seated Shoulder Row with Resistance Anchored at Feet  - 1 x daily - 3-5 x weekly - 2-3 sets - 10 reps - Seated Chest Press with Resistance Band  - 1 x daily - 3-5 x weekly - 2-3 sets - 10 reps - Seated Elbow Flexion with Self-Anchored Resistance  - 1 x daily - 3-5 x weekly - 2-3 sets - 10 reps -  Standing Shoulder Flexion to 90 Degrees  - 1  x daily - 3-5 x weekly - 2-3 sets - 10 reps - Shoulder Abduction - Thumbs Up  - 1 x daily - 3-5 x weekly - 2-3 sets - 10 reps - Shoulder External Rotation and Scapular Retraction with Resistance  - 1 x daily - 3-5 x weekly - 2-3 sets - 10 reps - Standing Shoulder Internal Rotation with Anchored Resistance  - 1 x daily - 7 x weekly - 3 sets - 10 reps   AAROM  for shoulder flexion, and abduction at the tabletop surface.  GOALS: Goals reviewed with patient? Yes  SHORT TERM GOALS: Target date: 06/18/24  Pt will be indep with HEP for increasing strength and ROM in the R shoulder. Baseline: Eval: To be initiated in upcoming sessions; 06/13/24: Min-mod vc to follow written handout for RUE strengthening Goal status: in progress   2.  Pt will identify and implement 2-3 activity modifications/AE in order to compensate for RUE weakness in order to maximize indep with BADLs. Baseline: Eval: Educ not yet initiated; 06/13/24: Hair care strategies reviewed last session, though pt reports no practice yet at home; initiated education on long handled AE to ease bathing and toileting ADLs this date  Goal status: in progress  LONG TERM GOALS: Target date: 07/30/24  1.  Pt will increase R active shoulder flexion and abd to 70 degrees or better to better engage the RUE into BADLs. Baseline: Eval: R shoulder flex 30, abd 51; 06/13/24: flex 40, abd 55 Goal status: in progress   2.  Pt will increase active R shoulder ER to 30 degrees or better to enable brushing/combing R side of head with R dominant hand and arm propped as needed. Baseline: Eval: Pt brushes hair with L non-dominant arm; active R shoulder ER 5* with arm propped into abd; 06/13/24: no change from eval Goal status: in progress   3.  Pt will increase active R shoulder IR for increased thoroughness with posterior LB bathing. Baseline: Eval: ~thumb to L4-5; pt reports spouse has to help with posterior in  shower; 06/13/24: spouse continues to assist; would benefit from long handled sponge; pt does demo improved reach to mid lower back, previously R side of lower back  Goal status: in progress  3. Pt will increase R shoulder strength by 1/2 muscle grade or more in order to better engage the RUE into BADLs.  Baseline: Eval: R shoulder grossly 2/5; 06/13/24: 1/2 grade improvement with IR and biceps only  Goal status: in progress  ASSESSMENT: CLINICAL IMPRESSION: Shortened tx session d/t pt arriving late.  Good tolerance to BUE UBE and active assist for forward and lateral reaching patterns on levels 1-2 on Saebo tower.  Pt receptive to bidet/hose attachments for sink/toilet to increase indep with peri hygiene d/t difficulty reaching posteriorly, even with the L unaffected side.  Pt continues to present with significant shoulder hiking when reaching in any direction with the RUE.  Pt. continues to benefit from skilled OT services to maximize safety and independence in necessary daily tasks.  Anticipate readiness to d/c OT within the next couple of weeks as pt is likely nearing max rehab potential.    PERFORMANCE DEFICITS: in functional skills including ADLs, IADLs, coordination, ROM, strength, pain, flexibility, Gross motor control, mobility, balance, body mechanics, decreased knowledge of precautions, decreased knowledge of use of DME, and UE functional use, cognitive skills including memory, and psychosocial skills including coping strategies, environmental adaptation, habits, and routines and behaviors.   IMPAIRMENTS: are limiting patient from  ADLs, IADLs, and leisure.   COMORBIDITIES: has co-morbidities such as bipolar disorder, CKD, HTN, DM2, arthritis that affects occupational performance. Patient will benefit from skilled OT to address above impairments and improve overall function.  MODIFICATION OR ASSISTANCE TO COMPLETE EVALUATION: Min-Moderate modification of tasks or assist with assess necessary  to complete an evaluation.  OT OCCUPATIONAL PROFILE AND HISTORY: Detailed assessment: Review of records and additional review of physical, cognitive, psychosocial history related to current functional performance.  CLINICAL DECISION MAKING: Moderate - several treatment options, min-mod task modification necessary  REHAB POTENTIAL: Fair based on limited participation last episode of OT in 2023  EVALUATION COMPLEXITY: Moderate  PLAN:  OT FREQUENCY: 2x/week  OT DURATION: 12 weeks  PLANNED INTERVENTIONS: 97168 OT Re-evaluation, 97535 self care/ADL training, 02889 therapeutic exercise, 97530 therapeutic activity, 97140 manual therapy, 97010 moist heat, 97010 cryotherapy, 97750 Physical Performance Testing, passive range of motion, psychosocial skills training, energy conservation, coping strategies training, patient/family education, and DME and/or AE instructions  RECOMMENDED OTHER SERVICES: None at this time (Pt currently seeing PT for mobility deficits)  CONSULTED AND AGREED WITH PLAN OF CARE: Patient  PLAN FOR NEXT SESSION: see above   Inocente Blazing, MS, OTR/L  07/12/2024, 2:38 PM

## 2024-07-12 NOTE — Therapy (Signed)
 OUTPATIENT PHYSICAL THERAPY TREATMENT  Patient Name: Stacey Ball MRN: [id number] DOB:May 30, 1961, 63 y.o., female Today's Date: 07/12/2024  PCP: Alla Amis MD REFERRING PROVIDER: Jonette, Minor  END OF SESSION:     PT End of Session - 07/12/24 1455     Visit Number 18    Number of Visits 24    Date for Recertification  07/19/24    Authorization Type HTA PPO    Progress Note Due on Visit 20    PT Start Time 1449    PT Stop Time 1530    PT Time Calculation (min) 41 min    Equipment Utilized During Treatment Gait belt    Activity Tolerance Patient tolerated treatment well;Patient limited by pain    Behavior During Therapy WFL for tasks assessed/performed           Past Medical History:  Diagnosis Date   Anemia    HX of   Arthritis    Bipolar 1 disorder (HCC)    Cancer (HCC)    skin cancer   Cataracts, bilateral    Diabetes mellitus without complication (HCC)    Dyspnea    on exertion (Pt states, out of shape)   Function kidney decreased    High cholesterol    Hypertension    Hypothyroidism    Psoriasis    Psoriatic arthritis (HCC)    Thyroid  disease    Past Surgical History:  Procedure Laterality Date   CATARACT EXTRACTION W/PHACO Left 01/10/2020   Procedure: CATARACT EXTRACTION PHACO AND INTRAOCULAR LENS PLACEMENT (IOC) LEFT DIABETIC;  Surgeon: Ferol Rogue, MD;  Location: [facility];  Service: Ophthalmology;  Laterality: Left;  5.32 0:41.5   CATARACT EXTRACTION W/PHACO Right 02/07/2020   Procedure: CATARACT EXTRACTION PHACO AND INTRAOCULAR LENS PLACEMENT (IOC) RIGHT DIABETIC VISION BLUE 4.01 00:27.4;  Surgeon: Ferol Rogue, MD;  Location: [facility];  Service: Ophthalmology;  Laterality: Right;  Diabetic - oral meds   COLONOSCOPY WITH PROPOFOL  N/A 03/04/2016   Procedure: COLONOSCOPY WITH PROPOFOL ;  Surgeon: Lamar ONEIDA Holmes, MD;  Location: [facility] END[facility];  Service: Endoscopy;  Laterality: N/A;   great toe amputation      matrixectomy     TONSILLECTOMY     Patient Active Problem List   Diagnosis Date Noted   Hypothyroidism 03/26/2022   UTI (urinary tract infection) 03/26/2022   Pneumonia due to COVID-19 virus 10/22/2020   Bipolar disorder (HCC) 10/22/2020   Acute encephalopathy 10/22/2020   Benign hypertensive kidney disease with chronic kidney disease 07/11/2019   Secondary hyperparathyroidism of renal origin 07/11/2019   Class 1 obesity due to excess calories with serious comorbidity and body mass index (BMI) of 32.0 to 32.9 in adult 05/09/2019   Type 2 diabetes mellitus with stage 3 chronic kidney disease, without long-term current use of insulin  (HCC) 09/16/2017   Stage 3 chronic kidney disease (HCC) 10/04/2016   Essential hypertriglyceridemia 03/22/2016   Essential hypertension 03/17/2016   Past Surgical History:  Procedure Laterality Date   CATARACT EXTRACTION W/PHACO Left 01/10/2020   Procedure: CATARACT EXTRACTION PHACO AND INTRAOCULAR LENS PLACEMENT (IOC) LEFT DIABETIC;  Surgeon: Ferol Rogue, MD;  Location: [facility];  Service: Ophthalmology;  Laterality: Left;  5.32 0:41.5   CATARACT EXTRACTION W/PHACO Right 02/07/2020   Procedure: CATARACT EXTRACTION PHACO AND INTRAOCULAR LENS PLACEMENT (IOC) RIGHT DIABETIC VISION BLUE 4.01 00:27.4;  Surgeon: Ferol Rogue, MD;  Location: [facility];  Service: Ophthalmology;  Laterality: Right;  Diabetic - oral meds   COLONOSCOPY WITH PROPOFOL   N/A 03/04/2016   Procedure: COLONOSCOPY WITH PROPOFOL ;  Surgeon: Lamar ONEIDA Holmes, MD;  Location: [facility] ENDOSCOPY;  Service: Endoscopy;  Laterality: N/A;   great toe amputation     matrixectomy     TONSILLECTOMY      Past Medical History:  Diagnosis Date   Anemia    HX of   Arthritis    Bipolar 1 disorder (HCC)    Cancer (HCC)    skin cancer   Cataracts, bilateral    Diabetes mellitus without complication (HCC)    Dyspnea    on exertion (Pt states, out of shape)   Function kidney  decreased    High cholesterol    Hypertension    Hypothyroidism    Psoriasis    Psoriatic arthritis (HCC)    Thyroid  disease     ONSET DATE: 2 years ago  REFERRING DIAG: Chronic right shoulder pain [M25.511, G89.29], Leg weakness, bilateral [R29.898]   THERAPY DIAG:   Muscle weakness (generalized)  Other abnormalities of gait and mobility  Other lack of coordination  Difficulty in walking, not elsewhere classified  Unsteadiness on feet  Rationale for Evaluation and Treatment: Rehabilitation  SUBJECTIVE:                                                                                                                                                                                             SUBJECTIVE STATEMENT:   Pt reports that she got an injection for her plantar fascitis, which helped for about a day. Pt reports she also has a bone spur. Pt also got in-soles & ordered some new shoes, but they haven't came in yet. Pt reports that she is limited to tylenol  d/t her kidneys. Pt reports heel pain at 9/10, describes as throbbing. Pt reports that she took tylenol  this morning, not due to take another dose yet.     PERTINENT HISTORY:  Pt presents to clinic today in transport chair. Pt has had consistent falls for the last 2 years, but she reports not having fallen in the last 6 months. Pt uses a 4WW at home c the brakes constantly on to avoid the rollator rolling outside her BOS. Pt stated she does not like 2WW as she fell over one when attempting to rush to the toilet one night. Pt states she has dizzy spells, particularly when coming from a sit to stand or with head turns during gait. Pt also stated her dizziness has tapered off recently as well. She reports all these started around when she was hospitalized for covid and PNA 2 years ago. Pt has a bed rail in her bed.  Installed it after falling out of bed multiple times. Pt has very limited shoulder ROM. She reports her husband helps  around a lot at home with ADL and IADL like washing her hair. However, she feels his help is prohibiting her from getting better and requested us  to explain to him that she needs to attempt more tasks on her own.  PAIN:  Are you having pain? No   PRECAUTIONS: Fall  WEIGHT BEARING RESTRICTIONS: No  FALLS: Has patient fallen in last 6 months? No  LIVING ENVIRONMENT: Lives with: lives with their spouse Lives in: House/apartment Stairs: No Has following equipment at home: Single point cane, Environmental consultant - 2 wheeled, and Environmental consultant - 4 wheeled  PLOF: Independent  PATIENT GOALS: Would like to walk without AD, Work up strength in L and R arm.   OBJECTIVE:  Note: Objective measures were completed at evaluation unless otherwise noted.                                                                                                                             TREATMENT DATE: 07/12/24 Pt arrives to session via handoff from OT in transport chair.   STM to L heel, midfoot for pain modulation pain reported at 8/10 following Gait 75' with RW pain still reported at 8/10 gait belt donned, CGA for safety Mild antalgic gait noted, decreased stance time on LLE Heel stretch in standing with BUE support at RW 2x30 sec LLE on slant board pt reported that she felt stretch in heel pain still reported at 8/10 Gait 75' with RW pain still at 8/10 gait belt donned, CGA for safety Mild antalgic gait noted, decreased stance time on LLE  Pt educated to continue icing heel for ~20 min bouts & complete heel stretching ~30 sec bouts throughout the day for management of plantar fascitis.   Pt exits session via transport chair.   PATIENT EDUCATION:  Education details: POC  Person educated: Patient  Education method: Explanation  Education comprehension: verbalized understanding  HOME EXERCISE PROGRAM: Access Code: [email or web address] URL: [email or web address] Date: 05/17/2024 Prepared by: Peggye Linear  Exercises - Sit to Stand with Counter Support  - 1 x daily - 2 sets - 10 reps - Standing March with Counter Support  - 1 x daily - 3 sets - 12 reps - Seated Scapular Retraction  - 1 x daily - 3 sets - 12 reps - Heel Raises with Counter Support  - 1 x daily - 3 sets - 10 reps   GOALS: Goals reviewed with patient? Yes  SHORT TERM GOALS: Target date: 07/19/2024  Consistently perform HEP at least 4x/week to maintain progress made in PT to improve function and overall QoL. Baseline: Initiated on 04/30/2024 05/28/24: updated on 05/17/24 Goal status: IN PROGRESS   LONG TERM GOALS: Target date: 07/19/2024  Pt will decrease her STS time by 10 seconds to improve power and strength which will increase QoL.  Baseline: 33.69 sec BUE  support 05/28/24: 29.18 sec w/ BUE support at armrests; 38.34 w/ BUEs at knees Goal status: IN PROGRESS  2.  Pt will decrease her TUG time by 10 seconds to reduce risk of falls and increase QoL.  Baseline: 42.64 sec with rollator 05/28/24: 29.37 sec with RW Goal status: MET   3.  Pt will decrease time by 0.25 m/s in order to increase gait speed to reduce fall risk. Baseline: 04/30/2024: 0.35 m/s using her personal rollator with brakes locked and close SBA/CGA for safety 05/28/24: Average Normal speed: 0.45 m/s with RW, CGA  Goal status: IN PROGRESS  4.  Pt will increase distance walked during to aid with community ambulation and reduce fall risk. Baseline: 04/30/2024: 103 feet, had to stop at 38min40seconds due to fatigue, (31.39 meters, Avg speed 0.86m/s) using her personal rollator with brakes locked and CGA for safety 05/28/24: 206 ft. 62.78 meters, Avg speed 0.66m/s) using a RW with skilled CGA.  Goal status: IN PROGRESS  5.  Pt will increase ABC score to 80% to increase her confidence with daily activities and improve overall QoL. Baseline: 30%; 05/28/24: 54.375%  Goal status: MET & upgraded on 8/11 from 50% > 80%   ASSESSMENT:  CLINICAL  IMPRESSION:    Session limited by plantar fascitis pain, presenting with antalgic gait & demonstrating hesitancy to complete gait this date. Pt benefited from STM to heel, decrease in pain to 8/10 for remainder of session. Pt able to complete 2 trials of gait this date ~75' each with RW & CGA for safety without increase in pain. Pt educated to continue with ice & calf stretching for management of plantar fascitis. Pt will continue to benefit from skilled therapy to address remaining deficits in order to improve overall QoL and return to PLOF.     OBJECTIVE IMPAIRMENTS: Abnormal gait, decreased activity tolerance, decreased balance, decreased coordination, decreased knowledge of use of DME, decreased mobility, difficulty walking, decreased ROM, decreased strength, dizziness, and improper body mechanics.   ACTIVITY LIMITATIONS: carrying, lifting, bending, standing, squatting, transfers, bed mobility, bathing, and locomotion level  PARTICIPATION LIMITATIONS: meal prep, cleaning, laundry, medication management, driving, shopping, community activity, and yard work  PERSONAL FACTORS: Past/current experiences, Time since onset of injury/illness/exacerbation, and 1-2 comorbidities:   are also affecting patient's functional outcome.   REHAB POTENTIAL: Good  CLINICAL DECISION MAKING: Evolving/moderate complexity  EVALUATION COMPLEXITY: Moderate  PLAN:  PT FREQUENCY: 2x/week  PT DURATION: 12 weeks  PLANNED INTERVENTIONS: 97110-Therapeutic exercises, 97530- Therapeutic activity, 97112- Neuromuscular re-education, 97535- Self Care, 02859- Manual therapy, [phone]- Gait training, Patient/Family education, Joint mobilization, and DME instructions  PLAN FOR NEXT SESSION:   Treatment for plantar fascitis as appropriate in order to increase gait distance, reduce pain Increase ambulation distance to improve endurance, decreased rest breaks Continue sit to stand for BLE strengthening; decreasing reliance on  [employer] Functional strengthening   Chiquita Silvan, SPT Physical Therapy Student - [facility]  [facility]  07/12/24, 5:25 PM

## 2024-07-16 ENCOUNTER — Ambulatory Visit: Admitting: Physical Therapy

## 2024-07-16 ENCOUNTER — Ambulatory Visit

## 2024-07-16 DIAGNOSIS — R262 Difficulty in walking, not elsewhere classified: Secondary | ICD-10-CM

## 2024-07-16 DIAGNOSIS — R2681 Unsteadiness on feet: Secondary | ICD-10-CM

## 2024-07-16 DIAGNOSIS — M6281 Muscle weakness (generalized): Secondary | ICD-10-CM

## 2024-07-16 DIAGNOSIS — G8929 Other chronic pain: Secondary | ICD-10-CM

## 2024-07-16 DIAGNOSIS — R278 Other lack of coordination: Secondary | ICD-10-CM

## 2024-07-16 DIAGNOSIS — R2689 Other abnormalities of gait and mobility: Secondary | ICD-10-CM

## 2024-07-16 NOTE — Therapy (Signed)
 OUTPATIENT PHYSICAL THERAPY TREATMENT  Patient Name: Stacey Ball MRN: [id number] DOB:[birth year], 63 y.o., female Today's Date: 07/16/2024  PCP: Alla Amis MD REFERRING PROVIDER: Jonette, Minor  END OF SESSION:     PT End of Session - 07/16/24 1146     Visit Number 19    Number of Visits 24    Date for Recertification  07/19/24    Authorization Type HTA PPO    Progress Note Due on Visit 20    PT Start Time 1145    PT Stop Time 1225    PT Time Calculation (min) 40 min    Equipment Utilized During Treatment Gait belt    Activity Tolerance Patient tolerated treatment well;Patient limited by pain    Behavior During Therapy WFL for tasks assessed/performed            Past Medical History:  Diagnosis Date   Anemia    HX of   Arthritis    Bipolar 1 disorder (HCC)    Cancer (HCC)    skin cancer   Cataracts, bilateral    Diabetes mellitus without complication (HCC)    Dyspnea    on exertion (Pt states, out of shape)   Function kidney decreased    High cholesterol    Hypertension    Hypothyroidism    Psoriasis    Psoriatic arthritis (HCC)    Thyroid  disease    Past Surgical History:  Procedure Laterality Date   CATARACT EXTRACTION W/PHACO Left 01/10/2020   Procedure: CATARACT EXTRACTION PHACO AND INTRAOCULAR LENS PLACEMENT (IOC) LEFT DIABETIC;  Surgeon: Ferol Rogue, MD;  Location: [facility];  Service: Ophthalmology;  Laterality: Left;  5.32 0:41.5   CATARACT EXTRACTION W/PHACO Right 02/07/2020   Procedure: CATARACT EXTRACTION PHACO AND INTRAOCULAR LENS PLACEMENT (IOC) RIGHT DIABETIC VISION BLUE 4.01 00:27.4;  Surgeon: Ferol Rogue, MD;  Location: [facility];  Service: Ophthalmology;  Laterality: Right;  Diabetic - oral meds   COLONOSCOPY WITH PROPOFOL  N/A 03/04/2016   Procedure: COLONOSCOPY WITH PROPOFOL ;  Surgeon: Lamar ONEIDA Holmes, MD;  Location: [facility] ENDOSCOPY;  Service: Endoscopy;  Laterality: N/A;   great toe amputation      matrixectomy     TONSILLECTOMY     Patient Active Problem List   Diagnosis Date Noted   Hypothyroidism 03/26/2022   UTI (urinary tract infection) 03/26/2022   Pneumonia due to COVID-19 virus 10/22/2020   Bipolar disorder (HCC) 10/22/2020   Acute encephalopathy 10/22/2020   Benign hypertensive kidney disease with chronic kidney disease 07/11/2019   Secondary hyperparathyroidism of renal origin 07/11/2019   Class 1 obesity due to excess calories with serious comorbidity and body mass index (BMI) of 32.0 to 32.9 in adult 05/09/2019   Type 2 diabetes mellitus with stage 3 chronic kidney disease, without long-term current use of insulin  (HCC) 09/16/2017   Stage 3 chronic kidney disease (HCC) 10/04/2016   Essential hypertriglyceridemia 03/22/2016   Essential hypertension 03/17/2016   Past Surgical History:  Procedure Laterality Date   CATARACT EXTRACTION W/PHACO Left 01/10/2020   Procedure: CATARACT EXTRACTION PHACO AND INTRAOCULAR LENS PLACEMENT (IOC) LEFT DIABETIC;  Surgeon: Ferol Rogue, MD;  Location: [facility];  Service: Ophthalmology;  Laterality: Left;  5.32 0:41.5   CATARACT EXTRACTION W/PHACO Right 02/07/2020   Procedure: CATARACT EXTRACTION PHACO AND INTRAOCULAR LENS PLACEMENT (IOC) RIGHT DIABETIC VISION BLUE 4.01 00:27.4;  Surgeon: Ferol Rogue, MD;  Location: [facility];  Service: Ophthalmology;  Laterality: Right;  Diabetic - oral meds   COLONOSCOPY WITH  PROPOFOL  N/A 03/04/2016   Procedure: COLONOSCOPY WITH PROPOFOL ;  Surgeon: Lamar ONEIDA Holmes, MD;  Location: [facility] ENDOSCOPY;  Service: Endoscopy;  Laterality: N/A;   great toe amputation     matrixectomy     TONSILLECTOMY      Past Medical History:  Diagnosis Date   Anemia    HX of   Arthritis    Bipolar 1 disorder (HCC)    Cancer (HCC)    skin cancer   Cataracts, bilateral    Diabetes mellitus without complication (HCC)    Dyspnea    on exertion (Pt states, out of shape)   Function kidney  decreased    High cholesterol    Hypertension    Hypothyroidism    Psoriasis    Psoriatic arthritis (HCC)    Thyroid  disease     ONSET DATE: 2 years ago  REFERRING DIAG: Chronic right shoulder pain [M25.511, G89.29], Leg weakness, bilateral [R29.898]   THERAPY DIAG:   Muscle weakness (generalized)  Other abnormalities of gait and mobility  Difficulty in walking, not elsewhere classified  Unsteadiness on feet  Rationale for Evaluation and Treatment: Rehabilitation  SUBJECTIVE:                                                                                                                                                                                             SUBJECTIVE STATEMENT:   Today: Pt reports her heel has still been hurting. Wearing insoles.  Last session: Pt reports that she got an injection for her plantar fascitis, which helped for about a day. Pt reports she also has a bone spur. Pt also got in-soles & ordered some new shoes, but they haven't came in yet. Pt reports that she is limited to tylenol  d/t her kidneys. Pt reports heel pain at 9/10, describes as throbbing. Pt reports that she took tylenol  this morning, not due to take another dose yet.     PERTINENT HISTORY:  Pt presents to clinic today in transport chair. Pt has had consistent falls for the last 2 years, but she reports not having fallen in the last 6 months. Pt uses a 4WW at home c the brakes constantly on to avoid the rollator rolling outside her BOS. Pt stated she does not like 2WW as she fell over one when attempting to rush to the toilet one night. Pt states she has dizzy spells, particularly when coming from a sit to stand or with head turns during gait. Pt also stated her dizziness has tapered off recently as well. She reports all these started around when she was hospitalized for covid and PNA 2  years ago. Pt has a bed rail in her bed. Installed it after falling out of bed multiple times. Pt has very  limited shoulder ROM. She reports her husband helps around a lot at home with ADL and IADL like washing her hair. However, she feels his help is prohibiting her from getting better and requested us  to explain to him that she needs to attempt more tasks on her own.  PAIN:  Are you having pain? No   PRECAUTIONS: Fall  WEIGHT BEARING RESTRICTIONS: No  FALLS: Has patient fallen in last 6 months? No  LIVING ENVIRONMENT: Lives with: lives with their spouse Lives in: House/apartment Stairs: No Has following equipment at home: Single point cane, Environmental consultant - 2 wheeled, and Environmental consultant - 4 wheeled  PLOF: Independent  PATIENT GOALS: Would like to walk without AD, Work up strength in L and R arm.   OBJECTIVE:  Note: Objective measures were completed at evaluation unless otherwise noted.                                                                                                                             TREATMENT DATE: 07/16/24 TA- To improve functional movements patterns for everyday tasks  /TE- To improve strength, endurance, mobility, and function of specific targeted muscle groups or improve joint range of motion or improve muscle flexibility  Nustep level 2-5 rolling hills setting for UE/LE reciprocal movement training and CV and LE endurance training x 8 min - cues to maintain > 30 SPM  Gait wth RW to mat table x 35 ft   STS from elevated mat table x 10 reps, no UE in standing- uses surface of plinth to push some   Gait with RW x 150 ft - cues for step length   STS from elevated mat table x 10 reps, no UE in standing- uses surface of plinth to push some   Gait with RW x 150 ft - cues for step length   Seated LAQ 2 x 10 with 3# AW   Standing march with 3# AW holdingn RW  Gait to transport chair x 15 ft and transfer to chair with RW  Pt required occasional rest breaks due fatigue, PT was attentive to when pt appeared to be tired or winded in order to prevent excessive  fatigue.  PATIENT EDUCATION:  Education details: POC  Person educated: Patient  Education method: Explanation  Education comprehension: verbalized understanding  HOME EXERCISE PROGRAM: Access Code: [email or web address] URL: [email or web address] Date: 05/17/2024 Prepared by: Peggye Linear  Exercises - Sit to Stand with Counter Support  - 1 x daily - 2 sets - 10 reps - Standing March with Counter Support  - 1 x daily - 3 sets - 12 reps - Seated Scapular Retraction  - 1 x daily - 3 sets - 12 reps - Heel Raises with Counter Support  - 1 x daily - 3 sets - 10 reps   GOALS: Goals reviewed with  patient? Yes  SHORT TERM GOALS: Target date: 07/19/2024  Consistently perform HEP at least 4x/week to maintain progress made in PT to improve function and overall QoL. Baseline: Initiated on 04/30/2024 05/28/24: updated on 05/17/24 Goal status: IN PROGRESS   LONG TERM GOALS: Target date: 07/19/2024  Pt will decrease her STS time by 10 seconds to improve power and strength which will increase QoL.  Baseline: 33.69 sec BUE support 05/28/24: 29.18 sec w/ BUE support at armrests; 38.34 w/ BUEs at knees Goal status: IN PROGRESS  2.  Pt will decrease her TUG time by 10 seconds to reduce risk of falls and increase QoL.  Baseline: 42.64 sec with rollator 05/28/24: 29.37 sec with RW Goal status: MET   3.  Pt will decrease time by 0.25 m/s in order to increase gait speed to reduce fall risk. Baseline: 04/30/2024: 0.35 m/s using her personal rollator with brakes locked and close SBA/CGA for safety 05/28/24: Average Normal speed: 0.45 m/s with RW, CGA  Goal status: IN PROGRESS  4.  Pt will increase distance walked during to aid with community ambulation and reduce fall risk. Baseline: 04/30/2024: 103 feet, had to stop at 65min40seconds due to fatigue, (31.39 meters, Avg speed 0.31m/s) using her personal rollator with brakes locked and CGA for safety 05/28/24: 206 ft. 62.78 meters, Avg speed  0.97m/s) using a RW with skilled CGA.  Goal status: IN PROGRESS  5.  Pt will increase ABC score to 80% to increase her confidence with daily activities and improve overall QoL. Baseline: 30%; 05/28/24: 54.375%  Goal status: MET & upgraded on 8/11 from 50% > 80%   ASSESSMENT:  CLINICAL IMPRESSION:    Patient arrived with good motivation for completion of pt activities.  Pt foot feeling good enough for more functional interventions. Pt challenged functional movements and endurance training on nustep to offload painful L foot. Pt will continue to benefit from skilled physical therapy intervention to address impairments, improve QOL, and attain therapy goals.     OBJECTIVE IMPAIRMENTS: Abnormal gait, decreased activity tolerance, decreased balance, decreased coordination, decreased knowledge of use of DME, decreased mobility, difficulty walking, decreased ROM, decreased strength, dizziness, and improper body mechanics.   ACTIVITY LIMITATIONS: carrying, lifting, bending, standing, squatting, transfers, bed mobility, bathing, and locomotion level  PARTICIPATION LIMITATIONS: meal prep, cleaning, laundry, medication management, driving, shopping, community activity, and yard work  PERSONAL FACTORS: Past/current experiences, Time since onset of injury/illness/exacerbation, and 1-2 comorbidities:   are also affecting patient's functional outcome.   REHAB POTENTIAL: Good  CLINICAL DECISION MAKING: Evolving/moderate complexity  EVALUATION COMPLEXITY: Moderate  PLAN:  PT FREQUENCY: 2x/week  PT DURATION: 12 weeks  PLANNED INTERVENTIONS: 97110-Therapeutic exercises, 97530- Therapeutic activity, 97112- Neuromuscular re-education, 97535- Self Care, 02859- Manual therapy, [phone]- Gait training, Patient/Family education, Joint mobilization, and DME instructions  PLAN FOR NEXT SESSION:   Treatment for plantar fascitis as appropriate in order to increase gait distance, reduce pain Increase ambulation  distance to improve endurance, decreased rest breaks Continue sit to stand for BLE strengthening; decreasing reliance on [employer] Functional strengthening   Note: Portions of this document were prepared using Dragon voice recognition software and although reviewed may contain unintentional dictation errors in syntax, grammar, or spelling.  Lonni KATHEE Gainer PT ,DPT Physical Therapist- [facility]    07/16/24, 11:46 AM

## 2024-07-17 NOTE — Therapy (Signed)
 OUTPATIENT OCCUPATIONAL THERAPY ORTHO TREATMENT NOTE  Patient Name: Stacey Ball MRN: [id number] DOB:Apr 06, 1961, 63 y.o., female Today's Date: 05/07/2024  PCP: Dr. Alda Helling REFERRING PROVIDER: Dr. Alda Helling  END OF SESSION:   OT End of Session - 07/17/24 0810     Visit Number 15    Number of Visits 24    Date for Recertification  07/30/24    Progress Note Due on Visit 20    OT Start Time 1110    OT Stop Time 1145    OT Time Calculation (min) 35 min    Equipment Utilized During Treatment transport chair    Activity Tolerance Patient tolerated treatment well    Behavior During Therapy WFL for tasks assessed/performed          Past Medical History:  Diagnosis Date   Anemia    HX of   Arthritis    Bipolar 1 disorder (HCC)    Cancer (HCC)    skin cancer   Cataracts, bilateral    Diabetes mellitus without complication (HCC)    Dyspnea    on exertion (Pt states, out of shape)   Function kidney decreased    High cholesterol    Hypertension    Hypothyroidism    Psoriasis    Psoriatic arthritis (HCC)    Thyroid  disease    Past Surgical History:  Procedure Laterality Date   CATARACT EXTRACTION W/PHACO Left 01/10/2020   Procedure: CATARACT EXTRACTION PHACO AND INTRAOCULAR LENS PLACEMENT (IOC) LEFT DIABETIC;  Surgeon: Ferol Rogue, MD;  Location: [facility];  Service: Ophthalmology;  Laterality: Left;  5.32 0:41.5   CATARACT EXTRACTION W/PHACO Right 02/07/2020   Procedure: CATARACT EXTRACTION PHACO AND INTRAOCULAR LENS PLACEMENT (IOC) RIGHT DIABETIC VISION BLUE 4.01 00:27.4;  Surgeon: Ferol Rogue, MD;  Location: [facility];  Service: Ophthalmology;  Laterality: Right;  Diabetic - oral meds   COLONOSCOPY WITH PROPOFOL  N/A 03/04/2016   Procedure: COLONOSCOPY WITH PROPOFOL ;  Surgeon: Lamar ONEIDA Holmes, MD;  Location: [facility];  Service: Endoscopy;  Laterality: N/A;   great toe amputation     matrixectomy     TONSILLECTOMY      Patient Active Problem List   Diagnosis Date Noted   Hypothyroidism 03/26/2022   UTI (urinary tract infection) 03/26/2022   Pneumonia due to COVID-19 virus 10/22/2020   Bipolar disorder (HCC) 10/22/2020   Acute encephalopathy 10/22/2020   Benign hypertensive kidney disease with chronic kidney disease 07/11/2019   Secondary hyperparathyroidism of renal origin (HCC) 07/11/2019   Class 1 obesity due to excess calories with serious comorbidity and body mass index (BMI) of 32.0 to 32.9 in adult 05/09/2019   Type 2 diabetes mellitus with stage 3 chronic kidney disease, without long-term current use of insulin  (HCC) 09/16/2017   Stage 3 chronic kidney disease (HCC) 10/04/2016   Essential hypertriglyceridemia 03/22/2016   Essential hypertension 03/17/2016   ONSET DATE: 03/26/22  REFERRING DIAG: M25.511,G89.29 (ICD-10-CM) - Chronic right shoulder pain   THERAPY DIAG:  Muscle weakness (generalized)  Other lack of coordination  Chronic right shoulder pain  Rationale for Evaluation and Treatment: Rehabilitation  SUBJECTIVE:  SUBJECTIVE STATEMENT: Pt reports that her L heel remains sore. Pt accompanied by: spouse, [place]  PERTINENT HISTORY:  Pt returns today to restart therapy for her R shoulder. Pt was seen by this therapist in 2023 post R proximal humerus fx, non-surgical.  Pt attended 4 OT visits in 2023, but then self discharged, though she doesn't recall her reasoning for stopping. Pt  did admit that she was stubborn, and should have kept going, as she remains quite limited with her shoulder ROM. Pt also currently being seen by outpatient PT  to address balance/mobility deficits d/t hx of falling.  Per chart on 03/26/22, 63 y/o old female with PMH significant for bipolar disorder, anemia, cataracts, hyperlipidemia, hypertension, hypothyroidism, obesity, type 2 diabetes, CKD stage IIIb presented in the ED with c/o: generalized  weakness and recurrent falls. Patient has generalized weakness  for some time which has gotten worse.  She had a fall yesterday and was evaluated outpatient and found to have right humeral fracture and was placed in a sling.  Patient had additional fall yesterday, denies any loss of consciousness or head trauma or head injury. In the ED work-up revealed UA positive for UTI.  Lactic acid normal.  x-ray right shoulder shows humeral neck fracture. ED physician spoke with orthopedics to discuss need for urgent evaluation.  Patient was admitted for generalized weakness secondary to UTI,  started on IV antibiotics.  Orthopedics consulted,  recommended conservative management. Patient is not a candidate for surgical intervention.  Advised outpatient follow-up.  Patient completed antibiotics for 3 days. PT recommended  skilled nursing facility for rehab.  Patient feels better and want to be discharged.  Patient being discharged skilled nursing facility for rehab.   PRECAUTIONS: Fall  RED FLAGS: None   WEIGHT BEARING RESTRICTIONS: No  PAIN: 07/16/24: occasional R shoulder pain, 7/10 pain L heel 0/10 pain at rest or activity in R shoulder, but does report pain in bilat knees and low back  Are you having pain? Yes: NPRS scale: 6/10  Pain location: low back and bilat knees Pain description: achy Aggravating factors: prolonged standing/walking Relieving factors: rest, Tylenol   FALLS: Has patient fallen in last 6 months? Yes. Number of falls pt unsure, but reports 6 falls, but within more than 6 months   LIVING ENVIRONMENT: Lives with: lives with their spouse Lives in: first floor apt  Stairs: No Has following equipment at home: Counselling psychologist, Environmental consultant - 2 wheeled, Environmental consultant - 4 wheeled, Wheelchair (manual), Shower bench, bed side commode, and Grab bars, bed rail   PLOF:  Independent with basic ADLs, spouse and pt shared IADL responsibilities prior to R shoulder fx in 2023  PATIENT GOALS: I would like to get better movement in my shoulder.   NEXT MD VISIT: Pt  unsure, Not for awhile.  OBJECTIVE:  Note: Objective measures were completed at Evaluation unless otherwise noted.  HAND DOMINANCE: Right  ADLs: Overall ADLs: spouse provides assist as needed.  Pt reports, He's doing a lot now.  Transfers/ambulation related to ADLs: supv-modified indep with AD Eating: indep  Grooming: uses L non-dominant hand to brush hair; able to use R dominant hand to brush teeth with electric toothbrush Upper body dressing: set up-modified indep with donning sports bra and shirt overhead  Lower body dressing: occasional help with clothing set up, extra time and effort to don socks, slip on shoes, indep with panties and pants  Toileting: 3in1 over toilet, uses L hand at baseline for pericare; has tried a toilet wand in the past, but minimally effective  Bathing: spouse helps to wash hair and back, spouse helps to wash buttocks Tub shower transfers: supv-min A for lifting legs over tub shower with pt using transfer tub bench and grab bar  Equipment: see above  UPPER EXTREMITY ROM:     Active ROM Left Eval WNL throughout Right eval Right 06/13/24  Shoulder flexion  30 (155) 40 (155)  Shoulder abduction  51 (153) 55 (155)  Shoulder adduction     Shoulder extension     Shoulder internal rotation  ~thumb to L4-5 (R side) ~thumb to L4-5 (middle)  Shoulder external rotation  5 (90) arm held in 90 abd by OT 5 (90) arm held in 90 abd by OT  Elbow flexion     Elbow extension     Wrist flexion     Wrist extension     Wrist ulnar deviation     Wrist radial deviation     Wrist pronation     Wrist supination     (Blank rows = not tested)  UPPER EXTREMITY MMT:     MMT Left eval Right eval Right 06/13/24  Shoulder flexion 4+ 2 2  Shoulder abduction 4+ 2 2  Shoulder adduction     Shoulder extension     Shoulder internal rotation 4+ 4- 4  Shoulder external rotation 4- 2 2  Middle trapezius     Lower trapezius     Elbow flexion 4+ 4 4+  Elbow extension 4+ 5  5  Wrist flexion 4+ 5 5  Wrist extension 4+ 5 5  Wrist ulnar deviation     Wrist radial deviation     Wrist pronation     Wrist supination     (Blank rows = not tested)  HAND FUNCTION: Grip strength: Right: 37 lbs; Left: 37 lbs, Lateral pinch: Right: 13 lbs, Left: 9 lbs, and 3 point pinch: Right: 9 lbs, Left: 6 lbs 06/13/24: Grip strength: Right: 35 lbs, Left: 35 lbs  COORDINATION: NT at eval d/t time constraints; will test in upcoming sessions if indicated  05/10/24: 9 hole peg test L 36, R 40  SENSATION:  WFL  EDEMA: No visible edema  COGNITION: Overall cognitive status: History of cognitive impairments - at baseline, decreased memory, hx of bipolar disorder   OBSERVATIONS:  Pt pleasant, cooperative, verbalizes eagerness to improve R shoulder mobility.  TREATMENT DATE: 07/16/24  Self Care: -Review of strategies to engage LUE into daily tasks, HEP review, poc review: upcoming d/c and d/c recommendations  Therapeutic Exercise:Continued focus on increasing indep with HEP for RUE strengthening: -UBE x8 min, alternating between forward and reverse rotations -RUE AAROM (no theraband) for shoulder flex, abd; min A from OT to depress R shoulder and maintain elbow extension during flex/abd x2 sets 10 reps  -R active ER with elbow at side; OT providing proximal support at elbow and shoulder to maintain positioning/reduce substitution patterns x2 sets 10 reps -Red theraband for R elbow flex, seated row, and chest press, IR; min vc and tactile cues to maintain form/technique; 2 sets 10 reps each                                                                                     PATIENT EDUCATION: Education details: ADL strategies Person educated: Patient, spouse Education method: Explanation, vc Education comprehension: verbalized understanding  HOME EXERCISE PROGRAM: Access Code: [email or web address] URL: [email or web address] Date: 05/28/2024 Prepared by: Inocente Blazing  Exercises - Seated Shoulder Row with Resistance Anchored at Feet  - 1  x daily - 3-5 x weekly - 2-3 sets - 10 reps - Seated Chest Press with Resistance Band  - 1 x daily - 3-5 x weekly - 2-3 sets - 10 reps - Seated Elbow Flexion with Self-Anchored Resistance  - 1 x daily - 3-5 x weekly - 2-3 sets - 10 reps - Standing Shoulder Flexion to 90 Degrees  - 1 x daily - 3-5 x weekly - 2-3 sets - 10 reps - Shoulder Abduction - Thumbs Up  - 1 x daily - 3-5 x weekly - 2-3 sets - 10 reps - Shoulder External Rotation and Scapular Retraction with Resistance  - 1 x daily - 3-5 x weekly - 2-3 sets - 10 reps - Standing Shoulder Internal Rotation with Anchored Resistance  - 1 x daily - 7 x weekly - 3 sets - 10 reps   AAROM  for shoulder flexion, and abduction at the tabletop surface.  GOALS: Goals reviewed with patient? Yes  SHORT TERM GOALS: Target date: 06/18/24  Pt will be indep with HEP for increasing strength and ROM in the R shoulder. Baseline: Eval: To be initiated in upcoming sessions; 06/13/24: Min-mod vc to follow written handout for RUE strengthening Goal status: in progress   2.  Pt will identify and implement 2-3 activity modifications/AE in order to compensate for RUE weakness in order to maximize indep with BADLs. Baseline: Eval: Educ not yet initiated; 06/13/24: Hair care strategies reviewed last session, though pt reports no practice yet at home; initiated education on long handled AE to ease bathing and toileting ADLs this date  Goal status: in progress  LONG TERM GOALS: Target date: 07/30/24  1.  Pt will increase R active shoulder flexion and abd to 70 degrees or better to better engage the RUE into BADLs. Baseline: Eval: R shoulder flex 30, abd 51; 06/13/24: flex 40, abd 55 Goal status: in progress   2.  Pt will increase active R shoulder ER to 30 degrees or better to enable brushing/combing R side of head with R dominant hand and arm propped as needed. Baseline: Eval: Pt  brushes hair with L non-dominant arm; active R shoulder ER 5* with arm propped into abd; 06/13/24: no change from eval Goal status: in progress   3.  Pt will increase active R shoulder IR for increased thoroughness with posterior LB bathing. Baseline: Eval: ~thumb to L4-5; pt reports spouse has to help with posterior in shower; 06/13/24: spouse continues to assist; would benefit from long handled sponge; pt does demo improved reach to mid lower back, previously R side of lower back  Goal status: in progress  3. Pt will increase R shoulder strength by 1/2 muscle grade or more in order to better engage the RUE into BADLs.  Baseline: Eval: R shoulder grossly 2/5; 06/13/24: 1/2 grade improvement with IR and biceps only  Goal status: in progress  ASSESSMENT: CLINICAL IMPRESSION: Pt with good tolerance to RUE therapeutic exercises this date.  Pt continues to require manual assist from OT for proximal stability at the shoulder to reduce substitution patterns during above noted exercises.  Pt/spouse verbalized understanding of education noted above.  Remaining visits will continue to focus on strengthening accessory musculature to compensate for pec/deltoid/rotator cuff insufficiency observed by movement quality and severely limited active ranges with R shoulder flexion and abd, as well as primary substitution patterns observed from use of the biceps and upper traps when raising the arm.  Anticipate readiness to d/c at/by end of cert as  pt is likely nearing max rehab potential.   r PERFORMANCE DEFICITS: in functional skills including ADLs, IADLs, coordination, ROM, strength, pain, flexibility, Gross motor control, mobility, balance, body mechanics, decreased knowledge of precautions, decreased knowledge of use of DME, and UE functional use, cognitive skills including memory, and psychosocial skills including coping strategies, environmental adaptation, habits, and routines and behaviors.   IMPAIRMENTS: are  limiting patient from ADLs, IADLs, and leisure.   COMORBIDITIES: has co-morbidities such as bipolar disorder, CKD, HTN, DM2, arthritis that affects occupational performance. Patient will benefit from skilled OT to address above impairments and improve overall function.  MODIFICATION OR ASSISTANCE TO COMPLETE EVALUATION: Min-Moderate modification of tasks or assist with assess necessary to complete an evaluation.  OT OCCUPATIONAL PROFILE AND HISTORY: Detailed assessment: Review of records and additional review of physical, cognitive, psychosocial history related to current functional performance.  CLINICAL DECISION MAKING: Moderate - several treatment options, min-mod task modification necessary  REHAB POTENTIAL: Fair based on limited participation last episode of OT in 2023  EVALUATION COMPLEXITY: Moderate  PLAN:  OT FREQUENCY: 2x/week  OT DURATION: 12 weeks  PLANNED INTERVENTIONS: 97168 OT Re-evaluation, 97535 self care/ADL training, 02889 therapeutic exercise, 97530 therapeutic activity, 97140 manual therapy, 97010 moist heat, 97010 cryotherapy, 97750 Physical Performance Testing, passive range of motion, psychosocial skills training, energy conservation, coping strategies training, patient/family education, and DME and/or AE instructions  RECOMMENDED OTHER SERVICES: None at this time (Pt currently seeing PT for mobility deficits)  CONSULTED AND AGREED WITH PLAN OF CARE: Patient  PLAN FOR NEXT SESSION: see above   Inocente Blazing, MS, OTR/L  07/17/2024, 8:11 AM

## 2024-07-19 ENCOUNTER — Ambulatory Visit

## 2024-07-19 DIAGNOSIS — M6281 Muscle weakness (generalized): Secondary | ICD-10-CM

## 2024-07-19 DIAGNOSIS — R2689 Other abnormalities of gait and mobility: Secondary | ICD-10-CM | POA: Insufficient documentation

## 2024-07-19 DIAGNOSIS — M79671 Pain in right foot: Secondary | ICD-10-CM | POA: Diagnosis not present

## 2024-07-19 DIAGNOSIS — M79672 Pain in left foot: Secondary | ICD-10-CM | POA: Insufficient documentation

## 2024-07-19 DIAGNOSIS — R278 Other lack of coordination: Secondary | ICD-10-CM

## 2024-07-19 DIAGNOSIS — G8929 Other chronic pain: Secondary | ICD-10-CM | POA: Diagnosis not present

## 2024-07-19 DIAGNOSIS — R262 Difficulty in walking, not elsewhere classified: Secondary | ICD-10-CM | POA: Insufficient documentation

## 2024-07-19 DIAGNOSIS — M25611 Stiffness of right shoulder, not elsewhere classified: Secondary | ICD-10-CM | POA: Diagnosis not present

## 2024-07-19 DIAGNOSIS — M25511 Pain in right shoulder: Secondary | ICD-10-CM | POA: Diagnosis not present

## 2024-07-19 DIAGNOSIS — M722 Plantar fascial fibromatosis: Secondary | ICD-10-CM | POA: Insufficient documentation

## 2024-07-19 DIAGNOSIS — R2681 Unsteadiness on feet: Secondary | ICD-10-CM | POA: Insufficient documentation

## 2024-07-19 NOTE — Therapy (Signed)
 OUTPATIENT PHYSICAL THERAPY TREATMENT/PHYSICAL THERAPY PROGRESS NOTE/RE-CERT   Dates of reporting period  05/28/24   to   07/19/24   Patient Name: Stacey Ball MRN: [id number] DOB:08-Apr-1961, 63 y.o., female Today's Date: 07/19/2024  PCP: Alla Amis MD REFERRING PROVIDER: Jonette, Minor  END OF SESSION:     PT End of Session - 07/19/24 1452     Visit Number 20    Number of Visits 24    Date for Recertification  08/16/24    Authorization Type H[employer] PPO    Progress Note Due on Visit 20    PT Start Time 1452    PT Stop Time 1530    PT Time Calculation (min) 38 min    Equipment Utilized During Treatment Gait belt    Activity Tolerance Patient tolerated treatment well;Patient limited by pain    Behavior During Therapy WFL for tasks assessed/performed          Past Medical History:  Diagnosis Date   Anemia    HX of   Arthritis    Bipolar 1 disorder (HCC)    Cancer (HCC)    skin cancer   Cataracts, bilateral    Diabetes mellitus without complication (HCC)    Dyspnea    on exertion (Pt states, out of shape)   Function kidney decreased    High cholesterol    Hypertension    Hypothyroidism    Psoriasis    Psoriatic arthritis (HCC)    Thyroid  disease    Past Surgical History:  Procedure Laterality Date   CATARACT EXTRACTION W/PHACO Left 01/10/2020   Procedure: CATARACT EXTRACTION PHACO AND INTRAOCULAR LENS PLACEMENT (IOC) LEFT DIABETIC;  Surgeon: Ferol Rogue, MD;  Location: [facility];  Service: Ophthalmology;  Laterality: Left;  5.32 0:41.5   CATARACT EXTRACTION W/PHACO Right 02/07/2020   Procedure: CATARACT EXTRACTION PHACO AND INTRAOCULAR LENS PLACEMENT (IOC) RIGHT DIABETIC VISION BLUE 4.01 00:27.4;  Surgeon: Ferol Rogue, MD;  Location: [facility];  Service: Ophthalmology;  Laterality: Right;  Diabetic - oral meds   COLONOSCOPY WITH PROPOFOL  N/A 03/04/2016   Procedure: COLONOSCOPY WITH PROPOFOL ;  Surgeon: Lamar ONEIDA Holmes, MD;  Location:  [facility] END[facility];  Service: Endoscopy;  Laterality: N/A;   great toe amputation     matrixectomy     TONSILLECTOMY     Patient Active Problem List   Diagnosis Date Noted   Hypothyroidism 03/26/2022   UTI (urinary tract infection) 03/26/2022   Pneumonia due to COVID-19 virus 10/22/2020   Bipolar disorder (HCC) 10/22/2020   Acute encephalopathy 10/22/2020   Benign hypertensive kidney disease with chronic kidney disease 07/11/2019   Secondary hyperparathyroidism of renal origin 07/11/2019   Class 1 obesity due to excess calories with serious comorbidity and body mass index (BMI) of 32.0 to 32.9 in adult 05/09/2019   Type 2 diabetes mellitus with stage 3 chronic kidney disease, without long-term current use of insulin  (HCC) 09/16/2017   Stage 3 chronic kidney disease (HCC) 10/04/2016   Essential hypertriglyceridemia 03/22/2016   Essential hypertension 03/17/2016   Past Surgical History:  Procedure Laterality Date   CATARACT EXTRACTION W/PHACO Left 01/10/2020   Procedure: CATARACT EXTRACTION PHACO AND INTRAOCULAR LENS PLACEMENT (IOC) LEFT DIABETIC;  Surgeon: Ferol Rogue, MD;  Location: [facility];  Service: Ophthalmology;  Laterality: Left;  5.32 0:41.5   CATARACT EXTRACTION W/PHACO Right 02/07/2020   Procedure: CATARACT EXTRACTION PHACO AND INTRAOCULAR LENS PLACEMENT (IOC) RIGHT DIABETIC VISION BLUE 4.01 00:27.4;  Surgeon: Ferol Rogue, MD;  Location: [facility];  Service: Ophthalmology;  Laterality: Right;  Diabetic - oral meds   COLONOSCOPY WITH PROPOFOL  N/A 03/04/2016   Procedure: COLONOSCOPY WITH PROPOFOL ;  Surgeon: Lamar ONEIDA Holmes, MD;  Location: [facility] END[facility];  Service: Endoscopy;  Laterality: N/A;   great toe amputation     matrixectomy     TONSILLECTOMY      Past Medical History:  Diagnosis Date   Anemia    HX of   Arthritis    Bipolar 1 disorder (HCC)    Cancer (HCC)    skin cancer   Cataracts, bilateral    Diabetes mellitus without complication (HCC)     Dyspnea    on exertion (Pt states, out of shape)   Function kidney decreased    High cholesterol    Hypertension    Hypothyroidism    Psoriasis    Psoriatic arthritis (HCC)    Thyroid  disease     ONSET DATE: 2 years ago  REFERRING DIAG: Chronic right shoulder pain [M25.511, G89.29], Leg weakness, bilateral [R29.898]   THERAPY DIAG:   Muscle weakness (generalized)  Other lack of coordination  Chronic right shoulder pain  Other abnormalities of gait and mobility  Difficulty in walking, not elsewhere classified  Unsteadiness on feet  Stiffness of right shoulder, not elsewhere classified  Right shoulder pain, unspecified chronicity  Plantar fasciitis  Pain in both feet  Rationale for Evaluation and Treatment: Rehabilitation  SUBJECTIVE:                                                                                                                                                                                             SUBJECTIVE STATEMENT:    Today: Pt reports her plantar fasciitis is still flared up and is making her not be able to walk well.  Pt notes 7/10 in the heel/foot.    Last session: Pt reports that she got an injection for her plantar fascitis, which helped for about a day. Pt reports she also has a bone spur. Pt also got in-soles & ordered some new shoes, but they haven't came in yet. Pt reports that she is limited to tylenol  d/t her kidneys. Pt reports heel pain at 9/10, describes as throbbing. Pt reports that she took tylenol  this morning, not due to take another dose yet.     PERTINENT HISTORY:  Pt presents to clinic today in transport chair. Pt has had consistent falls for the last 2 years, but she reports not having fallen in the last 6 months. Pt uses a 4WW at home c the brakes constantly on to avoid the rollator rolling outside her BOS.  Pt stated she does not like 2WW as she fell over one when attempting to rush to the toilet one night. Pt  states she has dizzy spells, particularly when coming from a sit to stand or with head turns during gait. Pt also stated her dizziness has tapered off recently as well. She reports all these started around when she was hospitalized for covid and PNA 2 years ago. Pt has a bed rail in her bed. Installed it after falling out of bed multiple times. Pt has very limited shoulder ROM. She reports her husband helps around a lot at home with ADL and IADL like washing her hair. However, she feels his help is prohibiting her from getting better and requested us  to explain to him that she needs to attempt more tasks on her own.  PAIN:  Are you having pain? No   PRECAUTIONS: Fall  WEIGHT BEARING RESTRICTIONS: No  FALLS: Has patient fallen in last 6 months? No  LIVING ENVIRONMENT: Lives with: lives with their spouse Lives in: House/apartment Stairs: No Has following equipment at home: Single point cane, Environmental consultant - 2 wheeled, and Environmental consultant - 4 wheeled  PLOF: Independent  PATIENT GOALS: Would like to walk without AD, Work up strength in L and R arm.   OBJECTIVE:  Note: Objective measures were completed at evaluation unless otherwise noted.                                                                                                                             TREATMENT DATE: 07/19/24   Physical Performance Testing:  Activities-specific Balance Confidence Scale:  Score: 71.25% Increased risk of falls in community-dwelling, older adults <80% (79.89%)  0% = no confidence - 100% = complete confidence (ANPTA Core Set of Outcome Measures for Adults with Neurologic Conditions, 2018)  10 Meter Walk Test: Patient instructed to walk 10 meters (32.8 ft) as quickly and as safely as possible at their normal speed Results: 0.41 m/s (24.08 seconds)  Cut off scores:   Household Ambulator  < 0.4 m/s  Limited Community Ambulator  0.4 - 0.8 m/s  [employer]  > 0.8 m/s  Increased fall risk  < 1.68m/s   Crossing a Street  >1.9m/s  MCID 0.05 m/s (small), 0.13 m/s (moderate), 0.06 m/s (significant)  (ANPTA Core Set of Outcome Measures for Adults with Neurologic Conditions, 2018)    2 Min Walk Test:  PT instructed patient to ambulate as quickly and as safely as possible for 2 minutes using LRAD. Patient was allowed to take standing rest breaks or slow pace without stopping the test, but if the patient required a sitting rest break the clock would be stopped and the test would be over.  Results: 47.85 m using a FWW and requiring CGA for safety. Results indicate that the patient has reduced endurance with ambulation compared to age matched norms.  Normative data for retirement dwelling older adults: 150.4 meters MDC for older adults:  12.2 meters    Self-Care/Home Management:  Pt and therapist discussed current POC and discharged options.  Pt also encouraged to reach out to PCP in regards to plantar fasciitis and educated on the need for them to sign off on therapy of the feet.  Pt agreeable and voiced understanding.  Pt educated on results of the physical performance testing and the lack of progress made toward goals at this time.      Pt required occasional rest breaks due fatigue, PT was attentive to when pt appeared to be tired or winded in order to prevent excessive fatigue.  PATIENT EDUCATION:  Education details: POC  Person educated: Patient  Education method: Explanation  Education comprehension: verbalized understanding  HOME EXERCISE PROGRAM: Access Code: [email or web address] URL: [email or web address] Date: 05/17/2024 Prepared by: Peggye Linear  Exercises - Sit to Stand with Counter Support  - 1 x daily - 2 sets - 10 reps - Standing March with Counter Support  - 1 x daily - 3 sets - 12 reps - Seated Scapular Retraction  - 1 x daily - 3 sets - 12 reps - Heel Raises with Counter Support  - 1 x daily - 3 sets - 10 reps   GOALS: Goals reviewed with patient? Yes  SHORT  TERM GOALS: Target date: 07/19/2024  Consistently perform HEP at least 4x/week to maintain progress made in PT to improve function and overall QoL. Baseline: Initiated on 04/30/2024 05/28/24: updated on 05/17/24 Goal status: IN PROGRESS   LONG TERM GOALS: Target date: 07/19/2024  Pt will decrease her STS time by 10 seconds to improve power and strength which will increase QoL.  Baseline: 33.69 sec BUE support 05/28/24: 29.18 sec w/ BUE support at armrests; 38.34 w/ BUEs at knees 07/19/24: 36.95 sec with BUE support Goal status: IN PROGRESS  2.  Pt will decrease her TUG time by 10 seconds to reduce risk of falls and increase QoL.  Baseline: 42.64 sec with rollator 05/28/24: 29.37 sec with RW Goal status: MET   3.  Pt will decrease time by 0.25 m/s in order to increase gait speed to reduce fall risk. Baseline: 04/30/2024: 0.35 m/s using her personal rollator with brakes locked and close SBA/CGA for safety 05/28/24: Average Normal speed: 0.45 m/s with RW, CGA  07/19/24: 24.08 sec; 0.41 m/s with RW, CGA Goal status: IN PROGRESS  4.  Pt will increase distance walked during to aid with community ambulation and reduce fall risk. Baseline: 04/30/2024: 103 feet, had to stop at 51min40seconds due to fatigue, (31.39 meters, Avg speed 0.61m/s) using her personal rollator with brakes locked and CGA for safety 05/28/24: 206 ft. 62.78 meters, Avg speed 0.72m/s) using a RW with skilled CGA.  07/19/24: 157'; 47.85 m; Avg speed: 0.40 m/s using RW and CGA Goal status: IN PROGRESS  5.  Pt will increase ABC score to 80% to increase her confidence with daily activities and improve overall QoL. Baseline: 30%;  05/28/24: 54.375% 07/19/24:  71.25% Goal status: MET & upgraded on 8/11 from 50% > 80%   ASSESSMENT:  CLINICAL IMPRESSION:    Pt has not been able to achieve her goals at this time and has actually regressed in a majority of them at this point in time.  Pt notes that it is due to the plantar  fasciitis inhibiting her ability to walk.  Pt would benefit from additionally therapy to address this and the current POC to be changed to include the plantar fasciitis as part of the  diagnosis and rationale for treatment due to it affecting her gait at this time.  Pt and therapist agreed to extend the POC for another 4 weeks to see if improvement could be made in that timeframe.  Patient's condition has the potential to improve in response to therapy. Maximum improvement is yet to be obtained. The anticipated improvement is attainable and reasonable in a generally predictable time.   Pt will continue to benefit from skilled therapy to address remaining deficits in order to improve overall QoL and return to PLOF.        OBJECTIVE IMPAIRMENTS: Abnormal gait, decreased activity tolerance, decreased balance, decreased coordination, decreased knowledge of use of DME, decreased mobility, difficulty walking, decreased ROM, decreased strength, dizziness, and improper body mechanics.   ACTIVITY LIMITATIONS: carrying, lifting, bending, standing, squatting, transfers, bed mobility, bathing, and locomotion level  PARTICIPATION LIMITATIONS: meal prep, cleaning, laundry, medication management, driving, shopping, community activity, and yard work  PERSONAL FACTORS: Past/current experiences, Time since onset of injury/illness/exacerbation, and 1-2 comorbidities:   are also affecting patient's functional outcome.   REHAB POTENTIAL: Good  CLINICAL DECISION MAKING: Evolving/moderate complexity  EVALUATION COMPLEXITY: Moderate  PLAN:  PT FREQUENCY: 2x/week  PT DURATION: 12 weeks  PLANNED INTERVENTIONS: 97110-Therapeutic exercises, 97530- Therapeutic activity, 97112- Neuromuscular re-education, 97535- Self Care, [id number]- Manual therapy, [phone]- Gait training, Patient/Family education, Joint mobilization, and DME instructions  PLAN FOR NEXT SESSION:    Treatment for plantar fascitis as appropriate in order to  increase gait distance, reduce pain Increase ambulation distance to improve endurance, decreased rest breaks Continue sit to stand for BLE strengthening; decreasing reliance on [employer] Functional strengthening    Fonda Simpers, PT, DPT Physical Therapist - [facility]  07/19/24, 6:12 PM

## 2024-07-20 NOTE — Therapy (Signed)
 OUTPATIENT OCCUPATIONAL THERAPY ORTHO TREATMENT NOTE  Patient Name: Stacey Ball MRN: [id number] DOB:02-23-1961, 63 y.o., female Today's Date: 05/07/2024  PCP: Dr. Alda Helling REFERRING PROVIDER: Dr. Alda Helling  END OF SESSION:   OT End of Session - 07/20/24 1208     Visit Number 16    Number of Visits 24    Date for Recertification  07/30/24    Progress Note Due on Visit 20    OT Start Time 1530    OT Stop Time 1615    OT Time Calculation (min) 45 min    Equipment Utilized During Treatment transport chair    Activity Tolerance Patient tolerated treatment well    Behavior During Therapy WFL for tasks assessed/performed         Past Medical History:  Diagnosis Date   Anemia    HX of   Arthritis    Bipolar 1 disorder (HCC)    Cancer (HCC)    skin cancer   Cataracts, bilateral    Diabetes mellitus without complication (HCC)    Dyspnea    on exertion (Pt states, out of shape)   Function kidney decreased    High cholesterol    Hypertension    Hypothyroidism    Psoriasis    Psoriatic arthritis (HCC)    Thyroid  disease    Past Surgical History:  Procedure Laterality Date   CATARACT EXTRACTION W/PHACO Left 01/10/2020   Procedure: CATARACT EXTRACTION PHACO AND INTRAOCULAR LENS PLACEMENT (IOC) LEFT DIABETIC;  Surgeon: Ferol Rogue, MD;  Location: [facility];  Service: Ophthalmology;  Laterality: Left;  5.32 0:41.5   CATARACT EXTRACTION W/PHACO Right 02/07/2020   Procedure: CATARACT EXTRACTION PHACO AND INTRAOCULAR LENS PLACEMENT (IOC) RIGHT DIABETIC VISION BLUE 4.01 00:27.4;  Surgeon: Ferol Rogue, MD;  Location: [facility];  Service: Ophthalmology;  Laterality: Right;  Diabetic - oral meds   COLONOSCOPY WITH PROPOFOL  N/A 03/04/2016   Procedure: COLONOSCOPY WITH PROPOFOL ;  Surgeon: Lamar ONEIDA Holmes, MD;  Location: [facility];  Service: Endoscopy;  Laterality: N/A;   great toe amputation     matrixectomy     TONSILLECTOMY      Patient Active Problem List   Diagnosis Date Noted   Hypothyroidism 03/26/2022   UTI (urinary tract infection) 03/26/2022   Pneumonia due to COVID-19 virus 10/22/2020   Bipolar disorder (HCC) 10/22/2020   Acute encephalopathy 10/22/2020   Benign hypertensive kidney disease with chronic kidney disease 07/11/2019   Secondary hyperparathyroidism of renal origin (HCC) 07/11/2019   Class 1 obesity due to excess calories with serious comorbidity and body mass index (BMI) of 32.0 to 32.9 in adult 05/09/2019   Type 2 diabetes mellitus with stage 3 chronic kidney disease, without long-term current use of insulin  (HCC) 09/16/2017   Stage 3 chronic kidney disease (HCC) 10/04/2016   Essential hypertriglyceridemia 03/22/2016   Essential hypertension 03/17/2016   ONSET DATE: 03/26/22  REFERRING DIAG: M25.511,G89.29 (ICD-10-CM) - Chronic right shoulder pain   THERAPY DIAG:  Muscle weakness (generalized)  Other lack of coordination  Chronic right shoulder pain  Rationale for Evaluation and Treatment: Rehabilitation  SUBJECTIVE:  SUBJECTIVE STATEMENT: Pt reports she's been icing her L heel a lot, which helps it to feel a bit better, though it is still very painful. Pt accompanied by: spouse, [place]  PERTINENT HISTORY:  Pt returns today to restart therapy for her R shoulder. Pt was seen by this therapist in 2023 post R proximal humerus fx, non-surgical.  Pt attended 4 OT visits  in 2023, but then self discharged, though she doesn't recall her reasoning for stopping. Pt did admit that she was stubborn, and should have kept going, as she remains quite limited with her shoulder ROM. Pt also currently being seen by outpatient PT  to address balance/mobility deficits d/t hx of falling.  Per chart on 03/26/22, 63 y/o old female with PMH significant for bipolar disorder, anemia, cataracts, hyperlipidemia, hypertension, hypothyroidism, obesity, type 2 diabetes, CKD stage IIIb presented in the ED with c/o:  generalized  weakness and recurrent falls. Patient has generalized weakness for some time which has gotten worse.  She had a fall yesterday and was evaluated outpatient and found to have right humeral fracture and was placed in a sling.  Patient had additional fall yesterday, denies any loss of consciousness or head trauma or head injury. In the ED work-up revealed UA positive for UTI.  Lactic acid normal.  x-ray right shoulder shows humeral neck fracture. ED physician spoke with orthopedics to discuss need for urgent evaluation.  Patient was admitted for generalized weakness secondary to UTI,  started on IV antibiotics.  Orthopedics consulted,  recommended conservative management. Patient is not a candidate for surgical intervention.  Advised outpatient follow-up.  Patient completed antibiotics for 3 days. PT recommended  skilled nursing facility for rehab.  Patient feels better and want to be discharged.  Patient being discharged skilled nursing facility for rehab.   PRECAUTIONS: Fall  RED FLAGS: None   WEIGHT BEARING RESTRICTIONS: No  PAIN: 07/19/24: occasional R shoulder pain (1-3/10), 7/10 pain L heel 0/10 pain at rest or activity in R shoulder, but does report pain in bilat knees and low back  Are you having pain? Yes: NPRS scale: 6/10  Pain location: low back and bilat knees Pain description: achy Aggravating factors: prolonged standing/walking Relieving factors: rest, Tylenol   FALLS: Has patient fallen in last 6 months? Yes. Number of falls pt unsure, but reports 6 falls, but within more than 6 months   LIVING ENVIRONMENT: Lives with: lives with their spouse Lives in: first floor apt  Stairs: No Has following equipment at home: Counselling psychologist, Environmental consultant - 2 wheeled, Environmental consultant - 4 wheeled, Wheelchair (manual), Shower bench, bed side commode, and Grab bars, bed rail   PLOF:  Independent with basic ADLs, spouse and pt shared IADL responsibilities prior to R shoulder fx in 2023  PATIENT  GOALS: I would like to get better movement in my shoulder.   NEXT MD VISIT: Pt unsure, Not for awhile.  OBJECTIVE:  Note: Objective measures were completed at Evaluation unless otherwise noted.  HAND DOMINANCE: Right  ADLs: Overall ADLs: spouse provides assist as needed.  Pt reports, He's doing a lot now.  Transfers/ambulation related to ADLs: supv-modified indep with AD Eating: indep  Grooming: uses L non-dominant hand to brush hair; able to use R dominant hand to brush teeth with electric toothbrush Upper body dressing: set up-modified indep with donning sports bra and shirt overhead  Lower body dressing: occasional help with clothing set up, extra time and effort to don socks, slip on shoes, indep with panties and pants  Toileting: 3in1 over toilet, uses L hand at baseline for pericare; has tried a toilet wand in the past, but minimally effective  Bathing: spouse helps to wash hair and back, spouse helps to wash buttocks Tub shower transfers: supv-min A for lifting legs over tub shower with pt using transfer tub bench and grab bar  Equipment: see above  UPPER EXTREMITY  ROM:     Active ROM Left Eval WNL throughout Right eval Right 06/13/24  Shoulder flexion  30 (155) 40 (155)  Shoulder abduction  51 (153) 55 (155)  Shoulder adduction     Shoulder extension     Shoulder internal rotation  ~thumb to L4-5 (R side) ~thumb to L4-5 (middle)  Shoulder external rotation  5 (90) arm held in 90 abd by OT 5 (90) arm held in 90 abd by OT  Elbow flexion     Elbow extension     Wrist flexion     Wrist extension     Wrist ulnar deviation     Wrist radial deviation     Wrist pronation     Wrist supination     (Blank rows = not tested)  UPPER EXTREMITY MMT:     MMT Left eval Right eval Right 06/13/24  Shoulder flexion 4+ 2 2  Shoulder abduction 4+ 2 2  Shoulder adduction     Shoulder extension     Shoulder internal rotation 4+ 4- 4  Shoulder external rotation 4- 2 2   Middle trapezius     Lower trapezius     Elbow flexion 4+ 4 4+  Elbow extension 4+ 5 5  Wrist flexion 4+ 5 5  Wrist extension 4+ 5 5  Wrist ulnar deviation     Wrist radial deviation     Wrist pronation     Wrist supination     (Blank rows = not tested)  HAND FUNCTION: Grip strength: Right: 37 lbs; Left: 37 lbs, Lateral pinch: Right: 13 lbs, Left: 9 lbs, and 3 point pinch: Right: 9 lbs, Left: 6 lbs 06/13/24: Grip strength: Right: 35 lbs, Left: 35 lbs  COORDINATION: NT at eval d/t time constraints; will test in upcoming sessions if indicated  05/10/24: 9 hole peg test L 36, R 40  SENSATION:  WFL  EDEMA: No visible edema  COGNITION: Overall cognitive status: History of cognitive impairments - at baseline, decreased memory, hx of bipolar disorder   OBSERVATIONS:  Pt pleasant, cooperative, verbalizes eagerness to improve R shoulder mobility.  TREATMENT DATE: 07/20/24  Therapeutic Activity: -Facilitated active assisted R shoulder forward flexion, abd, and ER, working to place [employer] between levels 1-2 of [employer].  Alternated positioning of rings and tower to facilitate above noted reaching patterns.  OT provided manual assist to minimize R shoulder hiking while reaching into above noted planes.   Self Care: -Continued review of strategies to engage RUE into daily tasks, HEP review, poc review: upcoming d/c and d/c recommendations  Therapeutic Exercise: -UBE x6 min, alternating between forward and reverse rotations at moderate level resistance -RUE AAROM (no theraband) for shoulder flex, abd, min A from OT to depress R shoulder and maintain elbow extension during flex/abd x3 sets 10 reps  -R active ER with elbow at side; OT providing proximal support at elbow and shoulder to maintain positioning/reduce substitution patterns x3 sets 10 reps.  Pt tolerated light resistance from OT.  PATIENT  EDUCATION: Education details: Poc Person educated: Patient Education method: Explanation, vc Education comprehension: verbalized understanding  HOME EXERCISE PROGRAM: Access Code: [email or web address] URL: [email or web address] Date: 05/28/2024 Prepared by: Inocente Blazing  Exercises - Seated Shoulder Row with Resistance Anchored at Feet  - 1 x daily - 3-5 x weekly - 2-3 sets - 10 reps - Seated Chest Press with Resistance Band  - 1 x daily - 3-5 x weekly - 2-3 sets - 10 reps - Seated Elbow Flexion with Self-Anchored Resistance  - 1 x daily - 3-5 x weekly - 2-3 sets - 10 reps - Standing Shoulder Flexion to 90 Degrees  - 1 x daily - 3-5 x weekly - 2-3 sets - 10 reps - Shoulder Abduction - Thumbs Up  - 1 x daily - 3-5 x weekly - 2-3 sets - 10 reps - Shoulder External Rotation and Scapular Retraction with Resistance  - 1 x daily - 3-5 x weekly - 2-3 sets - 10 reps - Standing Shoulder Internal Rotation with Anchored Resistance  - 1 x daily - 7 x weekly - 3 sets - 10 reps   AAROM  for shoulder flexion, and abduction at the tabletop surface.  GOALS: Goals reviewed with patient? Yes  SHORT TERM GOALS: Target date: 06/18/24  Pt will be indep with HEP for increasing strength and ROM in the R shoulder. Baseline: Eval: To be initiated in upcoming sessions; 06/13/24: Min-mod vc to follow written handout for RUE strengthening Goal status: in progress   2.  Pt will identify and implement 2-3 activity modifications/AE in order to compensate for RUE weakness in order to maximize indep with BADLs. Baseline: Eval: Educ not yet initiated; 06/13/24: Hair care strategies reviewed last session, though pt reports no practice yet at home; initiated education on long handled AE to ease bathing and toileting ADLs this date  Goal status: in progress  LONG TERM GOALS: Target date: 07/30/24  1.  Pt will increase R active shoulder flexion and abd to 70 degrees or better to better engage the RUE into  BADLs. Baseline: Eval: R shoulder flex 30, abd 51; 06/13/24: flex 40, abd 55 Goal status: in progress   2.  Pt will increase active R shoulder ER to 30 degrees or better to enable brushing/combing R side of head with R dominant hand and arm propped as needed. Baseline: Eval: Pt brushes hair with L non-dominant arm; active R shoulder ER 5* with arm propped into abd; 06/13/24: no change from eval Goal status: in progress   3.  Pt will increase active R shoulder IR for increased thoroughness with posterior LB bathing. Baseline: Eval: ~thumb to L4-5; pt reports spouse has to help with posterior in shower; 06/13/24: spouse continues to assist; would benefit from long handled sponge; pt does demo improved reach to mid lower back, previously R side of lower back  Goal status: in progress  3. Pt will increase R shoulder strength by 1/2 muscle grade or more in order to better engage the RUE into BADLs.  Baseline: Eval: R shoulder grossly 2/5; 06/13/24: 1/2 grade improvement with IR and biceps only  Goal status: in progress  ASSESSMENT: CLINICAL IMPRESSION: Pt with good tolerance to RUE therapeutic exercises this date.  Pt continues to require manual assist from OT for proximal stability at the shoulder to reduce substitution patterns during above noted exercises.  All exercises kept within tolerable end ranges to prevent pain d/t R shoulder instability and crepitus in the [facility] joint.  Remaining  visits will continue to focus on strengthening accessory musculature to compensate for pec/deltoid/rotator cuff insufficiency observed by movement quality and severely limited active ranges with R shoulder flexion and abd, as well as primary substitution patterns observed from use of the biceps and upper traps when raising the arm.  Continue to anticipate readiness to d/c at/by end of cert as pt is likely nearing max rehab potential.    PERFORMANCE DEFICITS: in functional skills including ADLs, IADLs, coordination, ROM,  strength, pain, flexibility, Gross motor control, mobility, balance, body mechanics, decreased knowledge of precautions, decreased knowledge of use of DME, and UE functional use, cognitive skills including memory, and psychosocial skills including coping strategies, environmental adaptation, habits, and routines and behaviors.   IMPAIRMENTS: are limiting patient from ADLs, IADLs, and leisure.   COMORBIDITIES: has co-morbidities such as bipolar disorder, CKD, HTN, DM2, arthritis that affects occupational performance. Patient will benefit from skilled OT to address above impairments and improve overall function.  MODIFICATION OR ASSISTANCE TO COMPLETE EVALUATION: Min-Moderate modification of tasks or assist with assess necessary to complete an evaluation.  OT OCCUPATIONAL PROFILE AND HISTORY: Detailed assessment: Review of records and additional review of physical, cognitive, psychosocial history related to current functional performance.  CLINICAL DECISION MAKING: Moderate - several treatment options, min-mod task modification necessary  REHAB POTENTIAL: Fair based on limited participation last episode of OT in 2023  EVALUATION COMPLEXITY: Moderate  PLAN:  OT FREQUENCY: 2x/week  OT DURATION: 12 weeks  PLANNED INTERVENTIONS: 97168 OT Re-evaluation, 97535 self care/ADL training, 02889 therapeutic exercise, 97530 therapeutic activity, 97140 manual therapy, 97010 moist heat, 97010 cryotherapy, 97750 Physical Performance Testing, passive range of motion, psychosocial skills training, energy conservation, coping strategies training, patient/family education, and DME and/or AE instructions  RECOMMENDED OTHER SERVICES: None at this time (Pt currently seeing PT for mobility deficits)  CONSULTED AND AGREED WITH PLAN OF CARE: Patient  PLAN FOR NEXT SESSION: see above   Inocente Blazing, MS, OTR/L  07/20/2024, 12:10 PM

## 2024-07-24 ENCOUNTER — Ambulatory Visit

## 2024-07-24 DIAGNOSIS — M6281 Muscle weakness (generalized): Secondary | ICD-10-CM | POA: Diagnosis not present

## 2024-07-24 DIAGNOSIS — G8929 Other chronic pain: Secondary | ICD-10-CM

## 2024-07-24 DIAGNOSIS — M722 Plantar fascial fibromatosis: Secondary | ICD-10-CM | POA: Diagnosis not present

## 2024-07-24 DIAGNOSIS — R2681 Unsteadiness on feet: Secondary | ICD-10-CM

## 2024-07-24 DIAGNOSIS — M25611 Stiffness of right shoulder, not elsewhere classified: Secondary | ICD-10-CM

## 2024-07-24 DIAGNOSIS — M79671 Pain in right foot: Secondary | ICD-10-CM

## 2024-07-24 DIAGNOSIS — R278 Other lack of coordination: Secondary | ICD-10-CM

## 2024-07-24 DIAGNOSIS — M7732 Calcaneal spur, left foot: Secondary | ICD-10-CM | POA: Diagnosis not present

## 2024-07-24 DIAGNOSIS — M25511 Pain in right shoulder: Secondary | ICD-10-CM

## 2024-07-24 DIAGNOSIS — R2689 Other abnormalities of gait and mobility: Secondary | ICD-10-CM

## 2024-07-24 DIAGNOSIS — R262 Difficulty in walking, not elsewhere classified: Secondary | ICD-10-CM

## 2024-07-24 DIAGNOSIS — E1142 Type 2 diabetes mellitus with diabetic polyneuropathy: Secondary | ICD-10-CM | POA: Diagnosis not present

## 2024-07-24 NOTE — Therapy (Signed)
 OUTPATIENT PHYSICAL THERAPY TREATMENT   Patient Name: Stacey Ball MRN: [id number] DOB:[birth year], 63 y.o., female Today's Date: 07/25/2024  PCP: Alla Amis MD REFERRING PROVIDER: Jonette, Minor  END OF SESSION:     PT End of Session - 07/24/24 1505     Visit Number 21    Number of Visits 24    Date for Recertification  08/16/24    Authorization Type H[employer] PPO    Progress Note Due on Visit 20    PT Start Time 1500    PT Stop Time 1530    PT Time Calculation (min) 30 min    Equipment Utilized During Treatment Gait belt    Activity Tolerance Patient tolerated treatment well;Patient limited by pain    Behavior During Therapy WFL for tasks assessed/performed           Past Medical History:  Diagnosis Date   Anemia    HX of   Arthritis    Bipolar 1 disorder (HCC)    Cancer (HCC)    skin cancer   Cataracts, bilateral    Diabetes mellitus without complication (HCC)    Dyspnea    on exertion (Pt states, out of shape)   Function kidney decreased    High cholesterol    Hypertension    Hypothyroidism    Psoriasis    Psoriatic arthritis (HCC)    Thyroid  disease    Past Surgical History:  Procedure Laterality Date   CATARACT EXTRACTION W/PHACO Left 01/10/2020   Procedure: CATARACT EXTRACTION PHACO AND INTRAOCULAR LENS PLACEMENT (IOC) LEFT DIABETIC;  Surgeon: Ferol Rogue, MD;  Location: [facility];  Service: Ophthalmology;  Laterality: Left;  5.32 0:41.5   CATARACT EXTRACTION W/PHACO Right 02/07/2020   Procedure: CATARACT EXTRACTION PHACO AND INTRAOCULAR LENS PLACEMENT (IOC) RIGHT DIABETIC VISION BLUE 4.01 00:27.4;  Surgeon: Ferol Rogue, MD;  Location: [facility];  Service: Ophthalmology;  Laterality: Right;  Diabetic - oral meds   COLONOSCOPY WITH PROPOFOL  N/A 03/04/2016   Procedure: COLONOSCOPY WITH PROPOFOL ;  Surgeon: Lamar ONEIDA Holmes, MD;  Location: [facility];  Service: Endoscopy;  Laterality: N/A;   great toe amputation      matrixectomy     TONSILLECTOMY     Patient Active Problem List   Diagnosis Date Noted   Hypothyroidism 03/26/2022   UTI (urinary tract infection) 03/26/2022   Pneumonia due to COVID-19 virus 10/22/2020   Bipolar disorder (HCC) 10/22/2020   Acute encephalopathy 10/22/2020   Benign hypertensive kidney disease with chronic kidney disease 07/11/2019   Secondary hyperparathyroidism of renal origin 07/11/2019   Class 1 obesity due to excess calories with serious comorbidity and body mass index (BMI) of 32.0 to 32.9 in adult 05/09/2019   Type 2 diabetes mellitus with stage 3 chronic kidney disease, without long-term current use of insulin  (HCC) 09/16/2017   Stage 3 chronic kidney disease (HCC) 10/04/2016   Essential hypertriglyceridemia 03/22/2016   Essential hypertension 03/17/2016   Past Surgical History:  Procedure Laterality Date   CATARACT EXTRACTION W/PHACO Left 01/10/2020   Procedure: CATARACT EXTRACTION PHACO AND INTRAOCULAR LENS PLACEMENT (IOC) LEFT DIABETIC;  Surgeon: Ferol Rogue, MD;  Location: [facility];  Service: Ophthalmology;  Laterality: Left;  5.32 0:41.5   CATARACT EXTRACTION W/PHACO Right 02/07/2020   Procedure: CATARACT EXTRACTION PHACO AND INTRAOCULAR LENS PLACEMENT (IOC) RIGHT DIABETIC VISION BLUE 4.01 00:27.4;  Surgeon: Ferol Rogue, MD;  Location: [facility];  Service: Ophthalmology;  Laterality: Right;  Diabetic - oral meds   COLONOSCOPY WITH  PROPOFOL  N/A 03/04/2016   Procedure: COLONOSCOPY WITH PROPOFOL ;  Surgeon: Lamar ONEIDA Holmes, MD;  Location: [facility];  Service: Endoscopy;  Laterality: N/A;   great toe amputation     matrixectomy     TONSILLECTOMY      Past Medical History:  Diagnosis Date   Anemia    HX of   Arthritis    Bipolar 1 disorder (HCC)    Cancer (HCC)    skin cancer   Cataracts, bilateral    Diabetes mellitus without complication (HCC)    Dyspnea    on exertion (Pt states, out of shape)   Function kidney  decreased    High cholesterol    Hypertension    Hypothyroidism    Psoriasis    Psoriatic arthritis (HCC)    Thyroid  disease     ONSET DATE: 2 years ago  REFERRING DIAG: Chronic right shoulder pain [M25.511, G89.29], Leg weakness, bilateral [R29.898]   THERAPY DIAG:   Muscle weakness (generalized)  Other lack of coordination  Chronic right shoulder pain  Other abnormalities of gait and mobility  Difficulty in walking, not elsewhere classified  Unsteadiness on feet  Stiffness of right shoulder, not elsewhere classified  Right shoulder pain, unspecified chronicity  Plantar fasciitis  Pain in both feet  Rationale for Evaluation and Treatment: Rehabilitation  SUBJECTIVE:                                                                                                                                                                                             SUBJECTIVE STATEMENT:    Today: Pt reports she had a good visit with the MD this morning.  Pt notes 3/10 pain level at this time.  Pt was given the option of having a shot of cortisone, but ultimately declined due to the last time having it and she did not really notice any difference.    Last Session: Pt reports her plantar fasciitis is still flared up and is making her not be able to walk well.  Pt notes 7/10 in the heel/foot.     PERTINENT HISTORY:  Pt presents to clinic today in transport chair. Pt has had consistent falls for the last 2 years, but she reports not having fallen in the last 6 months. Pt uses a 4WW at home c the brakes constantly on to avoid the rollator rolling outside her BOS. Pt stated she does not like 2WW as she fell over one when attempting to rush to the toilet one night. Pt states she has dizzy spells, particularly when coming from a sit to stand or with head turns during  gait. Pt also stated her dizziness has tapered off recently as well. She reports all these started around when she was  hospitalized for covid and PNA 2 years ago. Pt has a bed rail in her bed. Installed it after falling out of bed multiple times. Pt has very limited shoulder ROM. She reports her husband helps around a lot at home with ADL and IADL like washing her hair. However, she feels his help is prohibiting her from getting better and requested us  to explain to him that she needs to attempt more tasks on her own.  PAIN:  Are you having pain? No   PRECAUTIONS: Fall  WEIGHT BEARING RESTRICTIONS: No  FALLS: Has patient fallen in last 6 months? No  LIVING ENVIRONMENT: Lives with: lives with their spouse Lives in: House/apartment Stairs: No Has following equipment at home: Single point cane, Environmental consultant - 2 wheeled, and Environmental consultant - 4 wheeled  PLOF: Independent  PATIENT GOALS: Would like to walk without AD, Work up strength in L and R arm.   OBJECTIVE:  Note: Objective measures were completed at evaluation unless otherwise noted.                                                                                                                             TREATMENT DATE: 07/25/24  Manual:  All manual therapy applied to the L ankle/foot region:  IASTM to the plantar fascia area for pain modulation and improved tissue extensibility of the foot; cream applied and utilized for increased lubricity Supine distal tibiofibular AP mobilization for improved mobility of the tibiofibular joint Supine metatarsal fan mobilization with residual tightness and the pain in the 2nd and 3rd digits, with reduction in overall pain following the treatment approach Supine talocrural AP mobilization, Grades II-III, for pain modulation of the ankle joint and to reduce joint stiffness in the ankle     PATIENT EDUCATION:  Education details: POC  Person educated: Patient  Education method: Explanation  Education comprehension: verbalized understanding  HOME EXERCISE PROGRAM: Access Code: [email or web address] URL:  [email or web address] Date: 05/17/2024 Prepared by: Peggye Linear  Exercises - Sit to Stand with Counter Support  - 1 x daily - 2 sets - 10 reps - Standing March with Counter Support  - 1 x daily - 3 sets - 12 reps - Seated Scapular Retraction  - 1 x daily - 3 sets - 12 reps - Heel Raises with Counter Support  - 1 x daily - 3 sets - 10 reps   GOALS: Goals reviewed with patient? Yes  SHORT TERM GOALS: Target date: 07/19/2024  Consistently perform HEP at least 4x/week to maintain progress made in PT to improve function and overall QoL. Baseline: Initiated on 04/30/2024 05/28/24: updated on 05/17/24 Goal status: IN PROGRESS   LONG TERM GOALS: Target date: 07/19/2024  Pt will decrease her STS time by 10 seconds to improve power and strength which will increase QoL.  Baseline: 33.69 sec BUE support 05/28/24:  29.18 sec w/ BUE support at armrests; 38.34 w/ BUEs at knees 07/19/24: 36.95 sec with BUE support Goal status: IN PROGRESS  2.  Pt will decrease her TUG time by 10 seconds to reduce risk of falls and increase QoL.  Baseline: 42.64 sec with rollator 05/28/24: 29.37 sec with RW Goal status: MET   3.  Pt will decrease time by 0.25 m/s in order to increase gait speed to reduce fall risk. Baseline: 04/30/2024: 0.35 m/s using her personal rollator with brakes locked and close SBA/CGA for safety 05/28/24: Average Normal speed: 0.45 m/s with RW, CGA  07/19/24: 24.08 sec; 0.41 m/s with RW, CGA Goal status: IN PROGRESS  4.  Pt will increase distance walked during to aid with community ambulation and reduce fall risk. Baseline: 04/30/2024: 103 feet, had to stop at 40min40seconds due to fatigue, (31.39 meters, Avg speed 0.17m/s) using her personal rollator with brakes locked and CGA for safety 05/28/24: 206 ft. 62.78 meters, Avg speed 0.71m/s) using a RW with skilled CGA.  07/19/24: 157'; 47.85 m; Avg speed: 0.40 m/s using RW and CGA Goal status: IN PROGRESS  5.  Pt will  increase ABC score to 80% to increase her confidence with daily activities and improve overall QoL. Baseline: 30%;  05/28/24: 54.375% 07/19/24:  71.25% Goal status: MET & upgraded on 8/11 from 50% > 80%   ASSESSMENT:  CLINICAL IMPRESSION:    Pt reported a reduction in overall tightness and pain upon leaving the clinic.  Pt responded well to the manual therapy approach, however treatment session was limited due to time constraints from pt's late arrival.  Pt noted to have increased pain with fan mobilization of the 2nd and 3rd digits of the L foot and would benefit from continued mobilization to improve overall pain tolerance and mobility of the foot.   Pt will continue to benefit from skilled therapy to address remaining deficits in order to improve overall QoL and return to PLOF.         OBJECTIVE IMPAIRMENTS: Abnormal gait, decreased activity tolerance, decreased balance, decreased coordination, decreased knowledge of use of DME, decreased mobility, difficulty walking, decreased ROM, decreased strength, dizziness, and improper body mechanics.   ACTIVITY LIMITATIONS: carrying, lifting, bending, standing, squatting, transfers, bed mobility, bathing, and locomotion level  PARTICIPATION LIMITATIONS: meal prep, cleaning, laundry, medication management, driving, shopping, community activity, and yard work  PERSONAL FACTORS: Past/current experiences, Time since onset of injury/illness/exacerbation, and 1-2 comorbidities:   are also affecting patient's functional outcome.   REHAB POTENTIAL: Good  CLINICAL DECISION MAKING: Evolving/moderate complexity  EVALUATION COMPLEXITY: Moderate  PLAN:  PT FREQUENCY: 2x/week  PT DURATION: 12 weeks  PLANNED INTERVENTIONS: 97110-Therapeutic exercises, 97530- Therapeutic activity, 97112- Neuromuscular re-education, 97535- Self Care, [id number]- Manual therapy, [id number]- Gait training, Patient/Family education, Joint mobilization, and DME instructions  PLAN  FOR NEXT SESSION:    Treatment for plantar fascitis as appropriate in order to increase gait distance, reduce pain Increase ambulation distance to improve endurance, decreased rest breaks Continue sit to stand for BLE strengthening; decreasing reliance on [employer] Functional strengthening    Fonda Simpers, PT, DPT Physical Therapist - [facility]  [facility]  07/25/24, 8:37 AM

## 2024-07-25 NOTE — Therapy (Signed)
 OUTPATIENT OCCUPATIONAL THERAPY ORTHO TREATMENT NOTE  Patient Name: Stacey Ball MRN: [id number] DOB:1961-03-17, 63 y.o., female Today's Date: 05/07/2024  PCP: Dr. Alda Helling REFERRING PROVIDER: Dr. Alda Helling  END OF SESSION:   OT End of Session - 07/25/24 1625     Visit Number 17    Number of Visits 24    Date for Recertification  07/30/24    Progress Note Due on Visit 20    OT Start Time 1530    OT Stop Time 1615    OT Time Calculation (min) 45 min    Equipment Utilized During Treatment transport chair    Activity Tolerance Patient tolerated treatment well    Behavior During Therapy WFL for tasks assessed/performed         Past Medical History:  Diagnosis Date   Anemia    HX of   Arthritis    Bipolar 1 disorder (HCC)    Cancer (HCC)    skin cancer   Cataracts, bilateral    Diabetes mellitus without complication (HCC)    Dyspnea    on exertion (Pt states, out of shape)   Function kidney decreased    High cholesterol    Hypertension    Hypothyroidism    Psoriasis    Psoriatic arthritis (HCC)    Thyroid  disease    Past Surgical History:  Procedure Laterality Date   CATARACT EXTRACTION W/PHACO Left 01/10/2020   Procedure: CATARACT EXTRACTION PHACO AND INTRAOCULAR LENS PLACEMENT (IOC) LEFT DIABETIC;  Surgeon: Ferol Rogue, MD;  Location: [facility];  Service: Ophthalmology;  Laterality: Left;  5.32 0:41.5   CATARACT EXTRACTION W/PHACO Right 02/07/2020   Procedure: CATARACT EXTRACTION PHACO AND INTRAOCULAR LENS PLACEMENT (IOC) RIGHT DIABETIC VISION BLUE 4.01 00:27.4;  Surgeon: Ferol Rogue, MD;  Location: [facility];  Service: Ophthalmology;  Laterality: Right;  Diabetic - oral meds   COLONOSCOPY WITH PROPOFOL  N/A 03/04/2016   Procedure: COLONOSCOPY WITH PROPOFOL ;  Surgeon: Lamar ONEIDA Holmes, MD;  Location: [facility];  Service: Endoscopy;  Laterality: N/A;   great toe amputation     matrixectomy     TONSILLECTOMY      Patient Active Problem List   Diagnosis Date Noted   Hypothyroidism 03/26/2022   UTI (urinary tract infection) 03/26/2022   Pneumonia due to COVID-19 virus 10/22/2020   Bipolar disorder (HCC) 10/22/2020   Acute encephalopathy 10/22/2020   Benign hypertensive kidney disease with chronic kidney disease 07/11/2019   Secondary hyperparathyroidism of renal origin (HCC) 07/11/2019   Class 1 obesity due to excess calories with serious comorbidity and body mass index (BMI) of 32.0 to 32.9 in adult 05/09/2019   Type 2 diabetes mellitus with stage 3 chronic kidney disease, without long-term current use of insulin  (HCC) 09/16/2017   Stage 3 chronic kidney disease (HCC) 10/04/2016   Essential hypertriglyceridemia 03/22/2016   Essential hypertension 03/17/2016   ONSET DATE: 03/26/22  REFERRING DIAG: M25.511,G89.29 (ICD-10-CM) - Chronic right shoulder pain   THERAPY DIAG:  Muscle weakness (generalized)  Other lack of coordination  Chronic right shoulder pain  Rationale for Evaluation and Treatment: Rehabilitation  SUBJECTIVE:  SUBJECTIVE STATEMENT: Pt brought in her HEP from home for review.   Pt accompanied by: spouse, [place]  PERTINENT HISTORY:  Pt returns today to restart therapy for her R shoulder. Pt was seen by this therapist in 2023 post R proximal humerus fx, non-surgical.  Pt attended 4 OT visits in 2023, but then self discharged, though she doesn't recall her reasoning for  stopping. Pt did admit that she was stubborn, and should have kept going, as she remains quite limited with her shoulder ROM. Pt also currently being seen by outpatient PT  to address balance/mobility deficits d/t hx of falling.  Per chart on 03/26/22, 63 y/o old female with PMH significant for bipolar disorder, anemia, cataracts, hyperlipidemia, hypertension, hypothyroidism, obesity, type 2 diabetes, CKD stage IIIb presented in the ED with c/o: generalized  weakness and recurrent falls. Patient has generalized  weakness for some time which has gotten worse.  She had a fall yesterday and was evaluated outpatient and found to have right humeral fracture and was placed in a sling.  Patient had additional fall yesterday, denies any loss of consciousness or head trauma or head injury. In the ED work-up revealed UA positive for UTI.  Lactic acid normal.  x-ray right shoulder shows humeral neck fracture. ED physician spoke with orthopedics to discuss need for urgent evaluation.  Patient was admitted for generalized weakness secondary to UTI,  started on IV antibiotics.  Orthopedics consulted,  recommended conservative management. Patient is not a candidate for surgical intervention.  Advised outpatient follow-up.  Patient completed antibiotics for 3 days. PT recommended  skilled nursing facility for rehab.  Patient feels better and want to be discharged.  Patient being discharged skilled nursing facility for rehab.   PRECAUTIONS: Fall  RED FLAGS: None   WEIGHT BEARING RESTRICTIONS: No  PAIN: 07/24/24: occasional R shoulder pain (1-3/10), 7/10 pain L heel 0/10 pain at rest or activity in R shoulder, but does report pain in bilat knees and low back  Are you having pain? Yes: NPRS scale: 6/10  Pain location: low back and bilat knees Pain description: achy Aggravating factors: prolonged standing/walking Relieving factors: rest, Tylenol   FALLS: Has patient fallen in last 6 months? Yes. Number of falls pt unsure, but reports 6 falls, but within more than 6 months   LIVING ENVIRONMENT: Lives with: lives with their spouse Lives in: first floor apt  Stairs: No Has following equipment at home: Counselling psychologist, Environmental consultant - 2 wheeled, Environmental consultant - 4 wheeled, Wheelchair (manual), Shower bench, bed side commode, and Grab bars, bed rail   PLOF:  Independent with basic ADLs, spouse and pt shared IADL responsibilities prior to R shoulder fx in 2023  PATIENT GOALS: I would like to get better movement in my shoulder.    NEXT MD VISIT: Pt unsure, Not for awhile.  OBJECTIVE:  Note: Objective measures were completed at Evaluation unless otherwise noted.  HAND DOMINANCE: Right  ADLs: Overall ADLs: spouse provides assist as needed.  Pt reports, He's doing a lot now.  Transfers/ambulation related to ADLs: supv-modified indep with AD Eating: indep  Grooming: uses L non-dominant hand to brush hair; able to use R dominant hand to brush teeth with electric toothbrush Upper body dressing: set up-modified indep with donning sports bra and shirt overhead  Lower body dressing: occasional help with clothing set up, extra time and effort to don socks, slip on shoes, indep with panties and pants  Toileting: 3in1 over toilet, uses L hand at baseline for pericare; has tried a toilet wand in the past, but minimally effective  Bathing: spouse helps to wash hair and back, spouse helps to wash buttocks Tub shower transfers: supv-min A for lifting legs over tub shower with pt using transfer tub bench and grab bar  Equipment: see above  UPPER EXTREMITY ROM:     Active ROM Left Eval WNL throughout Right eval  Right 06/13/24  Shoulder flexion  30 (155) 40 (155)  Shoulder abduction  51 (153) 55 (155)  Shoulder adduction     Shoulder extension     Shoulder internal rotation  ~thumb to L4-5 (R side) ~thumb to L4-5 (middle)  Shoulder external rotation  5 (90) arm held in 90 abd by OT 5 (90) arm held in 90 abd by OT  Elbow flexion     Elbow extension     Wrist flexion     Wrist extension     Wrist ulnar deviation     Wrist radial deviation     Wrist pronation     Wrist supination     (Blank rows = not tested)  UPPER EXTREMITY MMT:     MMT Left eval Right eval Right 06/13/24  Shoulder flexion 4+ 2 2  Shoulder abduction 4+ 2 2  Shoulder adduction     Shoulder extension     Shoulder internal rotation 4+ 4- 4  Shoulder external rotation 4- 2 2  Middle trapezius     Lower trapezius     Elbow flexion 4+ 4 4+   Elbow extension 4+ 5 5  Wrist flexion 4+ 5 5  Wrist extension 4+ 5 5  Wrist ulnar deviation     Wrist radial deviation     Wrist pronation     Wrist supination     (Blank rows = not tested)  HAND FUNCTION: Grip strength: Right: 37 lbs; Left: 37 lbs, Lateral pinch: Right: 13 lbs, Left: 9 lbs, and 3 point pinch: Right: 9 lbs, Left: 6 lbs 06/13/24: Grip strength: Right: 35 lbs, Left: 35 lbs  COORDINATION: NT at eval d/t time constraints; will test in upcoming sessions if indicated  05/10/24: 9 hole peg test L 36, R 40  SENSATION:  WFL  EDEMA: No visible edema  COGNITION: Overall cognitive status: History of cognitive impairments - at baseline, decreased memory, hx of bipolar disorder   OBSERVATIONS:  Pt pleasant, cooperative, verbalizes eagerness to improve R shoulder mobility.  TREATMENT DATE: 07/24/24  Self Care: -HEP review; written cues added/highlighted for greater understanding of visual handout; reinforced keeping all exercises to pain free ranges -Reviewed plan for upcoming d/c next visit  Therapeutic Exercise: -UBE x6 min, alternating between forward and reverse rotations at moderate level resistance -RUE AAROM (no theraband) for shoulder flex, abd, min A from OT to depress R shoulder and maintain elbow extension during flex/abd x2 sets 10 reps; pt practiced stabilizing her R shoulder using L hand with min vc/tactile cues  -R active ER with elbow at side; OT providing proximal support at elbow and shoulder to maintain positioning/reduce substitution patterns x2 sets 10 reps.  Pt tolerated light resistance from OT.  -RUE: red theraband for bicep curl, seated row, IR with elbow at side x2 sets 10 reps each -RUE: yellow theraband for seated row (forward press is below shoulder level) x2 sets 10 reps each                                                                                     PATIENT EDUCATION: Education details: HEP Person educated: Patient Education method:  Explanation, vc, tactile cues, demo Education comprehension: verbalized understanding, returned demonstration  HOME EXERCISE PROGRAM: Access Code: [email or web address] URL: [email or web address] Date: 05/28/2024 Prepared by: Inocente Blazing  Exercises - Seated Shoulder Row with Resistance Anchored at Feet  - 1 x daily - 3-5 x weekly - 2-3 sets - 10 reps - Seated Chest Press with Resistance Band  - 1 x daily - 3-5 x weekly - 2-3 sets - 10 reps - Seated Elbow Flexion with Self-Anchored Resistance  - 1 x daily - 3-5 x weekly - 2-3 sets - 10 reps - Standing Shoulder Flexion to 90 Degrees  - 1 x daily - 3-5 x weekly - 2-3 sets - 10 reps - Shoulder Abduction - Thumbs Up  - 1 x daily - 3-5 x weekly - 2-3 sets - 10 reps - Shoulder External Rotation and Scapular Retraction with Resistance  - 1 x daily - 3-5 x weekly - 2-3 sets - 10 reps - Standing Shoulder Internal Rotation with Anchored Resistance  - 1 x daily - 7 x weekly - 3 sets - 10 reps   AAROM  for shoulder flexion, and abduction at the tabletop surface.  GOALS: Goals reviewed with patient? Yes  SHORT TERM GOALS: Target date: 06/18/24  Pt will be indep with HEP for increasing strength and ROM in the R shoulder. Baseline: Eval: To be initiated in upcoming sessions; 06/13/24: Min-mod vc to follow written handout for RUE strengthening Goal status: in progress   2.  Pt will identify and implement 2-3 activity modifications/AE in order to compensate for RUE weakness in order to maximize indep with BADLs. Baseline: Eval: Educ not yet initiated; 06/13/24: Hair care strategies reviewed last session, though pt reports no practice yet at home; initiated education on long handled AE to ease bathing and toileting ADLs this date  Goal status: in progress  LONG TERM GOALS: Target date: 07/30/24  1.  Pt will increase R active shoulder flexion and abd to 70 degrees or better to better engage the RUE into BADLs. Baseline: Eval: R shoulder flex 30, abd  51; 06/13/24: flex 40, abd 55 Goal status: in progress   2.  Pt will increase active R shoulder ER to 30 degrees or better to enable brushing/combing R side of head with R dominant hand and arm propped as needed. Baseline: Eval: Pt brushes hair with L non-dominant arm; active R shoulder ER 5* with arm propped into abd; 06/13/24: no change from eval Goal status: in progress   3.  Pt will increase active R shoulder IR for increased thoroughness with posterior LB bathing. Baseline: Eval: ~thumb to L4-5; pt reports spouse has to help with posterior in shower; 06/13/24: spouse continues to assist; would benefit from long handled sponge; pt does demo improved reach to mid lower back, previously R side of lower back  Goal status: in progress  3. Pt will increase R shoulder strength by 1/2 muscle grade or more in order to better engage the RUE into BADLs.  Baseline: Eval: R shoulder grossly 2/5; 06/13/24: 1/2 grade improvement with IR and biceps only  Goal status: in progress  ASSESSMENT: CLINICAL IMPRESSION: Pt with good tolerance to RUE therapeutic exercises this date.  Reinforced performing all exercises within pain free ranges.  Written cues added to pt's visual handout from home.  Pt required min vc to follow written handout for set 1, less cueing for set 2.  Planning d/c assessment next visit with max rehab potential met for OT.   PERFORMANCE  DEFICITS: in functional skills including ADLs, IADLs, coordination, ROM, strength, pain, flexibility, Gross motor control, mobility, balance, body mechanics, decreased knowledge of precautions, decreased knowledge of use of DME, and UE functional use, cognitive skills including memory, and psychosocial skills including coping strategies, environmental adaptation, habits, and routines and behaviors.   IMPAIRMENTS: are limiting patient from ADLs, IADLs, and leisure.   COMORBIDITIES: has co-morbidities such as bipolar disorder, CKD, HTN, DM2, arthritis that  affects occupational performance. Patient will benefit from skilled OT to address above impairments and improve overall function.  MODIFICATION OR ASSISTANCE TO COMPLETE EVALUATION: Min-Moderate modification of tasks or assist with assess necessary to complete an evaluation.  OT OCCUPATIONAL PROFILE AND HISTORY: Detailed assessment: Review of records and additional review of physical, cognitive, psychosocial history related to current functional performance.  CLINICAL DECISION MAKING: Moderate - several treatment options, min-mod task modification necessary  REHAB POTENTIAL: Fair based on limited participation last episode of OT in 2023  EVALUATION COMPLEXITY: Moderate  PLAN:  OT FREQUENCY: 2x/week  OT DURATION: 12 weeks  PLANNED INTERVENTIONS: 97168 OT Re-evaluation, 97535 self care/ADL training, 02889 therapeutic exercise, 97530 therapeutic activity, 97140 manual therapy, 97010 moist heat, 97010 cryotherapy, 97750 Physical Performance Testing, passive range of motion, psychosocial skills training, energy conservation, coping strategies training, patient/family education, and DME and/or AE instructions  RECOMMENDED OTHER SERVICES: None at this time (Pt currently seeing PT for mobility deficits)  CONSULTED AND AGREED WITH PLAN OF CARE: Patient  PLAN FOR NEXT SESSION: see above   Inocente Blazing, MS, OTR/L  07/25/2024, 4:26 PM

## 2024-07-26 ENCOUNTER — Ambulatory Visit

## 2024-07-26 ENCOUNTER — Ambulatory Visit: Admitting: Physical Therapy

## 2024-07-30 ENCOUNTER — Ambulatory Visit: Admitting: Physical Therapy

## 2024-07-30 DIAGNOSIS — M722 Plantar fascial fibromatosis: Secondary | ICD-10-CM

## 2024-07-30 DIAGNOSIS — M79671 Pain in right foot: Secondary | ICD-10-CM

## 2024-07-30 DIAGNOSIS — R278 Other lack of coordination: Secondary | ICD-10-CM

## 2024-07-30 DIAGNOSIS — R2689 Other abnormalities of gait and mobility: Secondary | ICD-10-CM

## 2024-07-30 DIAGNOSIS — R2681 Unsteadiness on feet: Secondary | ICD-10-CM

## 2024-07-30 DIAGNOSIS — R262 Difficulty in walking, not elsewhere classified: Secondary | ICD-10-CM

## 2024-07-30 DIAGNOSIS — M6281 Muscle weakness (generalized): Secondary | ICD-10-CM

## 2024-07-30 NOTE — Therapy (Signed)
 OUTPATIENT PHYSICAL THERAPY TREATMENT   Patient Name: Stacey Ball MRN: [id number] DOB:[birth year], 63 y.o., female Today's Date: 07/30/2024  PCP: Alla Amis MD REFERRING PROVIDER: Jonette, Minor  END OF SESSION:     PT End of Session - 07/30/24 1445     Visit Number 22    Number of Visits 24    Date for Recertification  08/16/24    Authorization Type H[employer] PPO    Progress Note Due on Visit 20    PT Start Time 1446    PT Stop Time 1530    PT Time Calculation (min) 44 min    Equipment Utilized During Treatment Gait belt    Activity Tolerance Patient tolerated treatment well;Patient limited by pain    Behavior During Therapy WFL for tasks assessed/performed            Past Medical History:  Diagnosis Date   Anemia    HX of   Arthritis    Bipolar 1 disorder (HCC)    Cancer (HCC)    skin cancer   Cataracts, bilateral    Diabetes mellitus without complication (HCC)    Dyspnea    on exertion (Pt states, out of shape)   Function kidney decreased    High cholesterol    Hypertension    Hypothyroidism    Psoriasis    Psoriatic arthritis (HCC)    Thyroid  disease    Past Surgical History:  Procedure Laterality Date   CATARACT EXTRACTION W/PHACO Left 01/10/2020   Procedure: CATARACT EXTRACTION PHACO AND INTRAOCULAR LENS PLACEMENT (IOC) LEFT DIABETIC;  Surgeon: Ferol Rogue, MD;  Location: [facility];  Service: Ophthalmology;  Laterality: Left;  5.32 0:41.5   CATARACT EXTRACTION W/PHACO Right 02/07/2020   Procedure: CATARACT EXTRACTION PHACO AND INTRAOCULAR LENS PLACEMENT (IOC) RIGHT DIABETIC VISION BLUE 4.01 00:27.4;  Surgeon: Ferol Rogue, MD;  Location: [facility];  Service: Ophthalmology;  Laterality: Right;  Diabetic - oral meds   COLONOSCOPY WITH PROPOFOL  N/A 03/04/2016   Procedure: COLONOSCOPY WITH PROPOFOL ;  Surgeon: Lamar ONEIDA Holmes, MD;  Location: [facility];  Service: Endoscopy;  Laterality: N/A;   great toe amputation      matrixectomy     TONSILLECTOMY     Patient Active Problem List   Diagnosis Date Noted   Hypothyroidism 03/26/2022   UTI (urinary tract infection) 03/26/2022   Pneumonia due to COVID-19 virus 10/22/2020   Bipolar disorder (HCC) 10/22/2020   Acute encephalopathy 10/22/2020   Benign hypertensive kidney disease with chronic kidney disease 07/11/2019   Secondary hyperparathyroidism of renal origin 07/11/2019   Class 1 obesity due to excess calories with serious comorbidity and body mass index (BMI) of 32.0 to 32.9 in adult 05/09/2019   Type 2 diabetes mellitus with stage 3 chronic kidney disease, without long-term current use of insulin  (HCC) 09/16/2017   Stage 3 chronic kidney disease (HCC) 10/04/2016   Essential hypertriglyceridemia 03/22/2016   Essential hypertension 03/17/2016   Past Surgical History:  Procedure Laterality Date   CATARACT EXTRACTION W/PHACO Left 01/10/2020   Procedure: CATARACT EXTRACTION PHACO AND INTRAOCULAR LENS PLACEMENT (IOC) LEFT DIABETIC;  Surgeon: Ferol Rogue, MD;  Location: [facility];  Service: Ophthalmology;  Laterality: Left;  5.32 0:41.5   CATARACT EXTRACTION W/PHACO Right 02/07/2020   Procedure: CATARACT EXTRACTION PHACO AND INTRAOCULAR LENS PLACEMENT (IOC) RIGHT DIABETIC VISION BLUE 4.01 00:27.4;  Surgeon: Ferol Rogue, MD;  Location: [facility];  Service: Ophthalmology;  Laterality: Right;  Diabetic - oral meds   COLONOSCOPY  WITH PROPOFOL  N/A 03/04/2016   Procedure: COLONOSCOPY WITH PROPOFOL ;  Surgeon: Lamar ONEIDA Holmes, MD;  Location: [facility] END[facility]COPY;  Service: Endoscopy;  Laterality: N/A;   great toe amputation     matrixectomy     TONSILLECTOMY      Past Medical History:  Diagnosis Date   Anemia    HX of   Arthritis    Bipolar 1 disorder (HCC)    Cancer (HCC)    skin cancer   Cataracts, bilateral    Diabetes mellitus without complication (HCC)    Dyspnea    on exertion (Pt states, out of shape)   Function kidney  decreased    High cholesterol    Hypertension    Hypothyroidism    Psoriasis    Psoriatic arthritis (HCC)    Thyroid  disease     ONSET DATE: 2 years ago  REFERRING DIAG: Chronic right shoulder pain [M25.511, G89.29], Leg weakness, bilateral [R29.898]   THERAPY DIAG:  Muscle weakness (generalized)  Other abnormalities of gait and mobility  Pain in both feet  Other lack of coordination  Difficulty in walking, not elsewhere classified  Unsteadiness on feet  Plantar fasciitis  Rationale for Evaluation and Treatment: Rehabilitation  SUBJECTIVE:                                                                                                                                                                                             SUBJECTIVE STATEMENT:   Pt reporting that her heel is still bothering her, where some days are better than others. Pt reports pain at 8/10 pain. Pt reports that she been using ice, which she feels like helps.   Pt to see endocrinologist on Thursday for DM management.       Previous Session: Pt reports her plantar fasciitis is still flared up and is making her not be able to walk well.  Pt notes 7/10 in the heel/foot.     PERTINENT HISTORY:  Pt presents to clinic today in transport chair. Pt has had consistent falls for the last 2 years, but she reports not having fallen in the last 6 months. Pt uses a 4WW at home c the brakes constantly on to avoid the rollator rolling outside her BOS. Pt stated she does not like 2WW as she fell over one when attempting to rush to the toilet one night. Pt states she has dizzy spells, particularly when coming from a sit to stand or with head turns during gait. Pt also stated her dizziness has tapered off recently as well. She reports all these started around when she was hospitalized for covid  and PNA 2 years ago. Pt has a bed rail in her bed. Installed it after falling out of bed multiple times. Pt has very limited  shoulder ROM. She reports her husband helps around a lot at home with ADL and IADL like washing her hair. However, she feels his help is prohibiting her from getting better and requested us  to explain to him that she needs to attempt more tasks on her own.  PAIN:  Are you having pain? No   PRECAUTIONS: Fall  WEIGHT BEARING RESTRICTIONS: No  FALLS: Has patient fallen in last 6 months? No  LIVING ENVIRONMENT: Lives with: lives with their spouse Lives in: House/apartment Stairs: No Has following equipment at home: Single point cane, Environmental consultant - 2 wheeled, and Environmental consultant - 4 wheeled  PLOF: Independent  PATIENT GOALS: Would like to walk without AD, Work up strength in L and R arm.   OBJECTIVE:  Note: Objective measures were completed at evaluation unless otherwise noted.                                                                                                                             TREATMENT DATE: 07/30/24 Unless otherwise noted, at least CGA & gait belt donned for pt safety.   Gait 150' with RW, CGA; pt reporting continued 8/10 pain. Pt presenting with antalgic gait, decreased step length.  Pt requiring verbal cueing for increased step length, maintaining proximity to RW.  Due to pt reports of continued pain & report that manual therapy was beneficial in previous sessions, completed STM & supine metatarsal fan mobilization grades II-III for pain modulation to L foot.  STM to plantar surface of foot, focusing on heel & midfoot; no areas of tenderness noted.  Pt reporting decreased pain with fan mobilization compared to previous session Pt reporting no change in pain following manual therapy  Completed additional 300' of gait with RW, CGA.  Continued verbal cueing for increased step length, heel strike, & maintaining proximity to RW.  Pt reporting that pain is same as when she entered PT appointment  STS x10 with BUE support at arm rests from green chair Verbal cueing for  decreasing amount of UE support, increase anterior weight shift     PATIENT EDUCATION:  Education details: POC  Person educated: Patient  Education method: Explanation  Education comprehension: verbalized understanding  HOME EXERCISE PROGRAM: Access Code: [email or web address] URL: [email or web address] Date: 05/17/2024 Prepared by: Peggye Linear  Exercises - Sit to Stand with Counter Support  - 1 x daily - 2 sets - 10 reps - Standing March with Counter Support  - 1 x daily - 3 sets - 12 reps - Seated Scapular Retraction  - 1 x daily - 3 sets - 12 reps - Heel Raises with Counter Support  - 1 x daily - 3 sets - 10 reps   GOALS: Goals reviewed with patient? Yes  SHORT TERM GOALS: Target date: 07/19/2024  Consistently perform HEP at  least 4x/week to maintain progress made in PT to improve function and overall QoL. Baseline: Initiated on 04/30/2024 05/28/24: updated on 05/17/24 Goal status: IN PROGRESS   LONG TERM GOALS: Target date: 07/19/2024  Pt will decrease her STS time by 10 seconds to improve power and strength which will increase QoL.  Baseline: 33.69 sec BUE support 05/28/24: 29.18 sec w/ BUE support at armrests; 38.34 w/ BUEs at knees 07/19/24: 36.95 sec with BUE support Goal status: IN PROGRESS  2.  Pt will decrease her TUG time by 10 seconds to reduce risk of falls and increase QoL.  Baseline: 42.64 sec with rollator 05/28/24: 29.37 sec with RW Goal status: MET   3.  Pt will decrease time by 0.25 m/s in order to increase gait speed to reduce fall risk. Baseline: 04/30/2024: 0.35 m/s using her personal rollator with brakes locked and close SBA/CGA for safety 05/28/24: Average Normal speed: 0.45 m/s with RW, CGA  07/19/24: 24.08 sec; 0.41 m/s with RW, CGA Goal status: IN PROGRESS  4.  Pt will increase distance walked during to aid with community ambulation and reduce fall risk. Baseline: 04/30/2024: 103 feet, had to stop at 68min40seconds due to fatigue,  (31.39 meters, Avg speed 0.48m/s) using her personal rollator with brakes locked and CGA for safety 05/28/24: 206 ft. 62.78 meters, Avg speed 0.81m/s) using a RW with skilled CGA.  07/19/24: 157'; 47.85 m; Avg speed: 0.40 m/s using RW and CGA Goal status: IN PROGRESS  5.  Pt will increase ABC score to 80% to increase her confidence with daily activities and improve overall QoL. Baseline: 30%;  05/28/24: 54.375% 07/19/24:  71.25% Goal status: MET & upgraded on 8/11 from 50% > 80%   ASSESSMENT:  CLINICAL IMPRESSION:   Pt continuing to be limited by heel pain with gait, therefore implemented manual therapy, as pt has previously responded well to intervention. Pt reporting decreased pain with fan mobilization of 2nd & 3rd digits of L foot compared to previous session. Pt reporting no change in pain during gait following STM & mobilization. Remainder of session focused on gait training for improved step length & maintaining proximity of RW. Pt also completed STSs with focus on decreasing UE support, promoting eccentric control. Pt will continue to benefit from skilled therapy to address remaining deficits in order to improve overall QoL and return to PLOF.         OBJECTIVE IMPAIRMENTS: Abnormal gait, decreased activity tolerance, decreased balance, decreased coordination, decreased knowledge of use of DME, decreased mobility, difficulty walking, decreased ROM, decreased strength, dizziness, and improper body mechanics.   ACTIVITY LIMITATIONS: carrying, lifting, bending, standing, squatting, transfers, bed mobility, bathing, and locomotion level  PARTICIPATION LIMITATIONS: meal prep, cleaning, laundry, medication management, driving, shopping, community activity, and yard work  PERSONAL FACTORS: Past/current experiences, Time since onset of injury/illness/exacerbation, and 1-2 comorbidities:   are also affecting patient's functional outcome.   REHAB POTENTIAL: Good  CLINICAL DECISION MAKING:  Evolving/moderate complexity  EVALUATION COMPLEXITY: Moderate  PLAN:  PT FREQUENCY: 2x/week  PT DURATION: 12 weeks  PLANNED INTERVENTIONS: 97110-Therapeutic exercises, 97530- Therapeutic activity, 97112- Neuromuscular re-education, 97535- Self Care, [id number]- Manual therapy, [phone]- Gait training, Patient/Family education, Joint mobilization, and DME instructions  PLAN FOR NEXT SESSION:   Treatment for plantar fascitis as appropriate in order to increase gait distance, reduce pain Increase ambulation distance to improve endurance, decreased rest breaks Continue sit to stand for BLE strengthening; decreasing reliance on [employer] Functional strengthening    Chiquita Silvan,  SPT Physical Therapy Student - [facility]  07/30/24, 3:45 PM

## 2024-08-02 ENCOUNTER — Ambulatory Visit

## 2024-08-06 ENCOUNTER — Telehealth: Payer: Self-pay

## 2024-08-06 ENCOUNTER — Ambulatory Visit

## 2024-08-06 NOTE — Telephone Encounter (Signed)
 Patient Name: Stacey Ball MRN: [id number] DOB:01/12/1961, 63 y.o., female Today's Date: 08/06/2024  Pt contacted via telephone and dino spoke with spouse. Informed him of her missed visit at 11am today and reminder for OT/PT visit on Wed. He stated he talked to her last night and she was unclear if she was going to return to PT. Instructed him to have patient call clinic to speak to front office or Primary PT to discuss her situation/status. He verbalized understanding.       Reyes LOISE London, PT 08/06/2024, 11:29 AM

## 2024-08-06 NOTE — Therapy (Incomplete)
 OUTPATIENT PHYSICAL THERAPY TREATMENT   Patient Name: Stacey Ball MRN: [id number] DOB:[birth year], 63 y.o., female Today's Date: 08/06/2024  PCP: Alla Amis MD REFERRING PROVIDER: Jonette, Minor  END OF SESSION:         Past Medical History:  Diagnosis Date   Anemia    HX of   Arthritis    Bipolar 1 disorder (HCC)    Cancer (HCC)    skin cancer   Cataracts, bilateral    Diabetes mellitus without complication (HCC)    Dyspnea    on exertion (Pt states, out of shape)   Function kidney decreased    High cholesterol    Hypertension    Hypothyroidism    Psoriasis    Psoriatic arthritis (HCC)    Thyroid  disease    Past Surgical History:  Procedure Laterality Date   CATARACT EXTRACTION W/PHACO Left 01/10/2020   Procedure: CATARACT EXTRACTION PHACO AND INTRAOCULAR LENS PLACEMENT (IOC) LEFT DIABETIC;  Surgeon: Ferol Rogue, MD;  Location: [facility];  Service: Ophthalmology;  Laterality: Left;  5.32 0:41.5   CATARACT EXTRACTION W/PHACO Right 02/07/2020   Procedure: CATARACT EXTRACTION PHACO AND INTRAOCULAR LENS PLACEMENT (IOC) RIGHT DIABETIC VISION BLUE 4.01 00:27.4;  Surgeon: Ferol Rogue, MD;  Location: [facility];  Service: Ophthalmology;  Laterality: Right;  Diabetic - oral meds   COLONOSCOPY WITH PROPOFOL  N/A 03/04/2016   Procedure: COLONOSCOPY WITH PROPOFOL ;  Surgeon: Lamar ONEIDA Holmes, MD;  Location: [facility];  Service: Endoscopy;  Laterality: N/A;   great toe amputation     matrixectomy     TONSILLECTOMY     Patient Active Problem List   Diagnosis Date Noted   Hypothyroidism 03/26/2022   UTI (urinary tract infection) 03/26/2022   Pneumonia due to COVID-19 virus 10/22/2020   Bipolar disorder (HCC) 10/22/2020   Acute encephalopathy 10/22/2020   Benign hypertensive kidney disease with chronic kidney disease 07/11/2019   Secondary hyperparathyroidism of renal origin 07/11/2019   Class 1 obesity due to excess calories with serious  comorbidity and body mass index (BMI) of 32.0 to 32.9 in adult 05/09/2019   Type 2 diabetes mellitus with stage 3 chronic kidney disease, without long-term current use of insulin  (HCC) 09/16/2017   Stage 3 chronic kidney disease (HCC) 10/04/2016   Essential hypertriglyceridemia 03/22/2016   Essential hypertension 03/17/2016   Past Surgical History:  Procedure Laterality Date   CATARACT EXTRACTION W/PHACO Left 01/10/2020   Procedure: CATARACT EXTRACTION PHACO AND INTRAOCULAR LENS PLACEMENT (IOC) LEFT DIABETIC;  Surgeon: Ferol Rogue, MD;  Location: [facility];  Service: Ophthalmology;  Laterality: Left;  5.32 0:41.5   CATARACT EXTRACTION W/PHACO Right 02/07/2020   Procedure: CATARACT EXTRACTION PHACO AND INTRAOCULAR LENS PLACEMENT (IOC) RIGHT DIABETIC VISION BLUE 4.01 00:27.4;  Surgeon: Ferol Rogue, MD;  Location: [facility];  Service: Ophthalmology;  Laterality: Right;  Diabetic - oral meds   COLONOSCOPY WITH PROPOFOL  N/A 03/04/2016   Procedure: COLONOSCOPY WITH PROPOFOL ;  Surgeon: Lamar ONEIDA Holmes, MD;  Location: [facility] END[facility];  Service: Endoscopy;  Laterality: N/A;   great toe amputation     matrixectomy     TONSILLECTOMY      Past Medical History:  Diagnosis Date   Anemia    HX of   Arthritis    Bipolar 1 disorder (HCC)    Cancer (HCC)    skin cancer   Cataracts, bilateral    Diabetes mellitus without complication (HCC)    Dyspnea    on exertion (Pt states, out of shape)  Function kidney decreased    High cholesterol    Hypertension    Hypothyroidism    Psoriasis    Psoriatic arthritis (HCC)    Thyroid  disease     ONSET DATE: 2 years ago  REFERRING DIAG: Chronic right shoulder pain [M25.511, G89.29], Leg weakness, bilateral [R29.898]   THERAPY DIAG:  No diagnosis found.  Rationale for Evaluation and Treatment: Rehabilitation  SUBJECTIVE:                                                                                                                                                                                              SUBJECTIVE STATEMENT:   Pt reporting that her heel is still bothering her, where some days are better than others. Pt reports pain at 8/10 pain. Pt reports that she been using ice, which she feels like helps.   Pt to see endocrinologist on Thursday for DM management.       Previous Session: Pt reports her plantar fasciitis is still flared up and is making her not be able to walk well.  Pt notes 7/10 in the heel/foot.     PERTINENT HISTORY:  Pt presents to clinic today in transport chair. Pt has had consistent falls for the last 2 years, but she reports not having fallen in the last 6 months. Pt uses a 4WW at home c the brakes constantly on to avoid the rollator rolling outside her BOS. Pt stated she does not like 2WW as she fell over one when attempting to rush to the toilet one night. Pt states she has dizzy spells, particularly when coming from a sit to stand or with head turns during gait. Pt also stated her dizziness has tapered off recently as well. She reports all these started around when she was hospitalized for covid and PNA 2 years ago. Pt has a bed rail in her bed. Installed it after falling out of bed multiple times. Pt has very limited shoulder ROM. She reports her husband helps around a lot at home with ADL and IADL like washing her hair. However, she feels his help is prohibiting her from getting better and requested us  to explain to him that she needs to attempt more tasks on her own.  PAIN:  Are you having pain? No   PRECAUTIONS: Fall  WEIGHT BEARING RESTRICTIONS: No  FALLS: Has patient fallen in last 6 months? No  LIVING ENVIRONMENT: Lives with: lives with their spouse Lives in: House/apartment Stairs: No Has following equipment at home: Single point cane, Environmental consultant - 2 wheeled, and [employer] - 4 wheeled  PLOF: Independent  PATIENT GOALS: Would  like to walk without AD, Work up strength in L and  R arm.   OBJECTIVE:  Note: Objective measures were completed at evaluation unless otherwise noted.                                                                                                                             TREATMENT DATE: 08/06/24 Unless otherwise noted, at least CGA & gait belt donned for pt safety.   Gait 150' with RW, CGA; pt reporting continued 8/10 pain. Pt presenting with antalgic gait, decreased step length.  Pt requiring verbal cueing for increased step length, maintaining proximity to RW.  Due to pt reports of continued pain & report that manual therapy was beneficial in previous sessions, completed STM & supine metatarsal fan mobilization grades II-III for pain modulation to L foot.  STM to plantar surface of foot, focusing on heel & midfoot; no areas of tenderness noted.  Pt reporting decreased pain with fan mobilization compared to previous session Pt reporting no change in pain following manual therapy  Completed additional 300' of gait with RW, CGA.  Continued verbal cueing for increased step length, heel strike, & maintaining proximity to RW.  Pt reporting that pain is same as when she entered PT appointment  STS x10 with BUE support at arm rests from green chair Verbal cueing for decreasing amount of UE support, increase anterior weight shift     PATIENT EDUCATION:  Education details: POC  Person educated: Patient  Education method: Explanation  Education comprehension: verbalized understanding  HOME EXERCISE PROGRAM: Access Code: [email or web address] URL: [email or web address] Date: 05/17/2024 Prepared by: Peggye Linear  Exercises - Sit to Stand with Counter Support  - 1 x daily - 2 sets - 10 reps - Standing March with Counter Support  - 1 x daily - 3 sets - 12 reps - Seated Scapular Retraction  - 1 x daily - 3 sets - 12 reps - Heel Raises with Counter Support  - 1 x daily - 3 sets - 10 reps   GOALS: Goals reviewed with patient?  Yes  SHORT TERM GOALS: Target date: 07/19/2024  Consistently perform HEP at least 4x/week to maintain progress made in PT to improve function and overall QoL. Baseline: Initiated on 04/30/2024 05/28/24: updated on 05/17/24 Goal status: IN PROGRESS   LONG TERM GOALS: Target date: 07/19/2024  Pt will decrease her STS time by 10 seconds to improve power and strength which will increase QoL.  Baseline: 33.69 sec BUE support 05/28/24: 29.18 sec w/ BUE support at armrests; 38.34 w/ BUEs at knees 07/19/24: 36.95 sec with BUE support Goal status: IN PROGRESS  2.  Pt will decrease her TUG time by 10 seconds to reduce risk of falls and increase QoL.  Baseline: 42.64 sec with rollator 05/28/24: 29.37 sec with RW Goal status: MET   3.  Pt will decrease time by 0.25 m/s in order to increase gait speed to reduce fall risk. Baseline:  04/30/2024: 0.35 m/s using her personal rollator with brakes locked and close SBA/CGA for safety 05/28/24: Average Normal speed: 0.45 m/s with RW, CGA  07/19/24: 24.08 sec; 0.41 m/s with RW, CGA Goal status: IN PROGRESS  4.  Pt will increase distance walked during to aid with community ambulation and reduce fall risk. Baseline: 04/30/2024: 103 feet, had to stop at 7min40seconds due to fatigue, (31.39 meters, Avg speed 0.17m/s) using her personal rollator with brakes locked and CGA for safety 05/28/24: 206 ft. 62.78 meters, Avg speed 0.61m/s) using a RW with skilled CGA.  07/19/24: 157'; 47.85 m; Avg speed: 0.40 m/s using RW and CGA Goal status: IN PROGRESS  5.  Pt will increase ABC score to 80% to increase her confidence with daily activities and improve overall QoL. Baseline: 30%;  05/28/24: 54.375% 07/19/24:  71.25% Goal status: MET & upgraded on 8/11 from 50% > 80%   ASSESSMENT:  CLINICAL IMPRESSION:   Pt continuing to be limited by heel pain with gait, therefore implemented manual therapy, as pt has previously responded well to intervention. Pt reporting  decreased pain with fan mobilization of 2nd & 3rd digits of L foot compared to previous session. Pt reporting no change in pain during gait following STM & mobilization. Remainder of session focused on gait training for improved step length & maintaining proximity of RW. Pt also completed STSs with focus on decreasing UE support, promoting eccentric control. Pt will continue to benefit from skilled therapy to address remaining deficits in order to improve overall QoL and return to PLOF.         OBJECTIVE IMPAIRMENTS: Abnormal gait, decreased activity tolerance, decreased balance, decreased coordination, decreased knowledge of use of DME, decreased mobility, difficulty walking, decreased ROM, decreased strength, dizziness, and improper body mechanics.   ACTIVITY LIMITATIONS: carrying, lifting, bending, standing, squatting, transfers, bed mobility, bathing, and locomotion level  PARTICIPATION LIMITATIONS: meal prep, cleaning, laundry, medication management, driving, shopping, community activity, and yard work  PERSONAL FACTORS: Past/current experiences, Time since onset of injury/illness/exacerbation, and 1-2 comorbidities:   are also affecting patient's functional outcome.   REHAB POTENTIAL: Good  CLINICAL DECISION MAKING: Evolving/moderate complexity  EVALUATION COMPLEXITY: Moderate  PLAN:  PT FREQUENCY: 2x/week  PT DURATION: 12 weeks  PLANNED INTERVENTIONS: 97110-Therapeutic exercises, 97530- Therapeutic activity, 97112- Neuromuscular re-education, 97535- Self Care, [id number]- Manual therapy, [phone]- Gait training, Patient/Family education, Joint mobilization, and DME instructions  PLAN FOR NEXT SESSION:   Treatment for plantar fascitis as appropriate in order to increase gait distance, reduce pain Increase ambulation distance to improve endurance, decreased rest breaks Continue sit to stand for BLE strengthening; decreasing reliance on [employer] Functional strengthening   Chyrl London,  PT Physical Therapist [place] at [facility]  08/06/24, 8:32 AM

## 2024-08-08 ENCOUNTER — Ambulatory Visit

## 2024-08-13 ENCOUNTER — Ambulatory Visit

## 2024-08-15 ENCOUNTER — Ambulatory Visit

## 2024-08-15 ENCOUNTER — Ambulatory Visit: Admitting: Occupational Therapy

## 2024-08-20 ENCOUNTER — Ambulatory Visit: Admitting: Physical Therapy

## 2024-08-20 ENCOUNTER — Ambulatory Visit: Admitting: Occupational Therapy

## 2024-08-22 ENCOUNTER — Ambulatory Visit

## 2024-08-22 ENCOUNTER — Ambulatory Visit: Admitting: Occupational Therapy

## 2024-08-27 ENCOUNTER — Ambulatory Visit

## 2024-08-27 ENCOUNTER — Encounter

## 2024-08-29 ENCOUNTER — Ambulatory Visit

## 2024-08-29 ENCOUNTER — Encounter: Admitting: Occupational Therapy

## 2024-09-03 ENCOUNTER — Ambulatory Visit: Admitting: Physical Therapy

## 2024-09-03 ENCOUNTER — Encounter

## 2024-09-05 ENCOUNTER — Encounter

## 2024-09-05 ENCOUNTER — Ambulatory Visit: Admitting: Physical Therapy

## 2024-09-06 DIAGNOSIS — E039 Hypothyroidism, unspecified: Secondary | ICD-10-CM | POA: Diagnosis not present

## 2024-09-06 DIAGNOSIS — N183 Chronic kidney disease, stage 3 unspecified: Secondary | ICD-10-CM | POA: Diagnosis not present

## 2024-09-06 DIAGNOSIS — E1122 Type 2 diabetes mellitus with diabetic chronic kidney disease: Secondary | ICD-10-CM | POA: Diagnosis not present

## 2024-09-10 ENCOUNTER — Ambulatory Visit

## 2024-09-10 ENCOUNTER — Encounter

## 2024-09-11 DIAGNOSIS — Z0000 Encounter for general adult medical examination without abnormal findings: Secondary | ICD-10-CM | POA: Diagnosis not present

## 2024-09-11 DIAGNOSIS — Z6838 Body mass index (BMI) 38.0-38.9, adult: Secondary | ICD-10-CM | POA: Diagnosis not present

## 2024-09-11 DIAGNOSIS — E66812 Obesity, class 2: Secondary | ICD-10-CM | POA: Diagnosis not present

## 2024-09-11 DIAGNOSIS — E7800 Pure hypercholesterolemia, unspecified: Secondary | ICD-10-CM | POA: Diagnosis not present

## 2024-09-12 ENCOUNTER — Encounter: Admitting: Occupational Therapy

## 2024-09-12 ENCOUNTER — Ambulatory Visit: Admitting: Physical Therapy

## 2024-09-17 ENCOUNTER — Ambulatory Visit: Admitting: Physical Therapy

## 2024-09-17 ENCOUNTER — Other Ambulatory Visit: Payer: Self-pay

## 2024-09-17 ENCOUNTER — Encounter

## 2024-09-17 DIAGNOSIS — E66812 Obesity, class 2: Secondary | ICD-10-CM | POA: Diagnosis not present

## 2024-09-17 DIAGNOSIS — Z6837 Body mass index (BMI) 37.0-37.9, adult: Secondary | ICD-10-CM | POA: Diagnosis not present

## 2024-09-17 DIAGNOSIS — Z1331 Encounter for screening for depression: Secondary | ICD-10-CM | POA: Diagnosis not present

## 2024-09-17 DIAGNOSIS — Z0000 Encounter for general adult medical examination without abnormal findings: Secondary | ICD-10-CM | POA: Diagnosis not present

## 2024-09-17 DIAGNOSIS — E871 Hypo-osmolality and hyponatremia: Secondary | ICD-10-CM | POA: Diagnosis not present

## 2024-09-17 MED ORDER — BENZTROPINE MESYLATE 1 MG PO TABS
1.0000 mg | ORAL_TABLET | Freq: Every day | ORAL | 1 refills | Status: AC
  Filled 2024-09-17: qty 60, 60d supply, fill #0

## 2024-09-17 MED ORDER — LEVOTHYROXINE SODIUM 100 MCG PO TABS
100.0000 ug | ORAL_TABLET | Freq: Every day | ORAL | 3 refills | Status: AC
  Filled 2024-09-17: qty 90, 90d supply, fill #0

## 2024-09-17 MED ORDER — QUETIAPINE FUMARATE 200 MG PO TABS
200.0000 mg | ORAL_TABLET | Freq: Every day | ORAL | 0 refills | Status: DC
  Filled 2024-09-17 – 2024-09-23 (×2): qty 30, 30d supply, fill #0

## 2024-09-17 MED ORDER — PIOGLITAZONE HCL 15 MG PO TABS
15.0000 mg | ORAL_TABLET | Freq: Every day | ORAL | 3 refills | Status: AC
  Filled 2024-09-17: qty 90, 90d supply, fill #0

## 2024-09-17 MED ORDER — DIVALPROEX SODIUM ER 250 MG PO TB24
750.0000 mg | ORAL_TABLET | Freq: Every day | ORAL | 0 refills | Status: DC
  Filled 2024-09-23: qty 90, 30d supply, fill #0

## 2024-09-17 MED ORDER — FLUOCINONIDE 0.05 % EX GEL: Freq: Two times a day (BID) | CUTANEOUS | 5 refills | Status: AC

## 2024-09-19 ENCOUNTER — Encounter

## 2024-09-19 ENCOUNTER — Ambulatory Visit: Admitting: Physical Therapy

## 2024-09-23 ENCOUNTER — Other Ambulatory Visit: Payer: Self-pay

## 2024-09-23 ENCOUNTER — Other Ambulatory Visit (HOSPITAL_COMMUNITY): Payer: Self-pay

## 2024-09-24 ENCOUNTER — Encounter

## 2024-09-24 ENCOUNTER — Ambulatory Visit: Admitting: Physical Therapy

## 2024-09-26 ENCOUNTER — Ambulatory Visit

## 2024-09-26 ENCOUNTER — Encounter

## 2024-09-26 DIAGNOSIS — F250 Schizoaffective disorder, bipolar type: Secondary | ICD-10-CM | POA: Diagnosis not present

## 2024-10-01 ENCOUNTER — Encounter

## 2024-10-01 ENCOUNTER — Ambulatory Visit: Admitting: Physical Therapy

## 2024-10-01 ENCOUNTER — Other Ambulatory Visit: Payer: Self-pay

## 2024-10-01 MED ORDER — FLUOCINONIDE 0.05 % EX GEL
1.0000 | Freq: Two times a day (BID) | CUTANEOUS | 5 refills | Status: AC
  Filled 2024-10-01: qty 30, 30d supply, fill #0
  Filled 2024-10-01 – 2024-10-19 (×2): qty 60, 30d supply, fill #0

## 2024-10-01 MED ORDER — QUETIAPINE FUMARATE 200 MG PO TABS
200.0000 mg | ORAL_TABLET | Freq: Every day | ORAL | 5 refills | Status: AC
  Filled 2024-10-01 – 2024-11-19 (×4): qty 30, 30d supply, fill #0

## 2024-10-01 MED ORDER — DIVALPROEX SODIUM ER 250 MG PO TB24
750.0000 mg | ORAL_TABLET | Freq: Every day | ORAL | 5 refills | Status: AC
  Filled 2024-10-01 – 2024-11-19 (×4): qty 90, 30d supply, fill #0

## 2024-10-01 MED ORDER — BENZTROPINE MESYLATE 1 MG PO TABS
1.0000 mg | ORAL_TABLET | Freq: Two times a day (BID) | ORAL | 5 refills | Status: AC
  Filled 2024-10-01: qty 60, 30d supply, fill #0

## 2024-10-02 ENCOUNTER — Other Ambulatory Visit: Payer: Self-pay

## 2024-10-02 MED ORDER — CLONAZEPAM 1 MG PO TABS
1.0000 mg | ORAL_TABLET | Freq: Every day | ORAL | 5 refills | Status: AC
  Filled 2024-10-02 – 2024-10-30 (×2): qty 30, 30d supply, fill #0

## 2024-10-03 ENCOUNTER — Ambulatory Visit: Admitting: Physical Therapy

## 2024-10-03 ENCOUNTER — Encounter

## 2024-10-08 ENCOUNTER — Encounter

## 2024-10-08 ENCOUNTER — Ambulatory Visit: Admitting: Physical Therapy

## 2024-10-10 ENCOUNTER — Ambulatory Visit: Admitting: Physical Therapy

## 2024-10-10 ENCOUNTER — Encounter

## 2024-10-15 ENCOUNTER — Encounter

## 2024-10-15 ENCOUNTER — Ambulatory Visit: Admitting: Physical Therapy

## 2024-10-17 ENCOUNTER — Encounter

## 2024-10-17 ENCOUNTER — Ambulatory Visit

## 2024-10-19 ENCOUNTER — Other Ambulatory Visit: Payer: Self-pay

## 2024-10-22 ENCOUNTER — Other Ambulatory Visit: Payer: Self-pay

## 2024-10-22 ENCOUNTER — Ambulatory Visit: Admitting: Physical Therapy

## 2024-10-22 ENCOUNTER — Encounter

## 2024-10-24 ENCOUNTER — Ambulatory Visit

## 2024-10-24 ENCOUNTER — Encounter

## 2024-10-29 ENCOUNTER — Ambulatory Visit

## 2024-10-29 ENCOUNTER — Encounter

## 2024-10-30 ENCOUNTER — Other Ambulatory Visit: Payer: Self-pay

## 2024-10-31 ENCOUNTER — Ambulatory Visit

## 2024-10-31 ENCOUNTER — Encounter

## 2024-11-05 ENCOUNTER — Ambulatory Visit

## 2024-11-05 ENCOUNTER — Encounter

## 2024-11-07 ENCOUNTER — Ambulatory Visit

## 2024-11-07 ENCOUNTER — Encounter

## 2024-11-12 ENCOUNTER — Encounter

## 2024-11-12 ENCOUNTER — Ambulatory Visit

## 2024-11-14 ENCOUNTER — Ambulatory Visit

## 2024-11-14 ENCOUNTER — Encounter

## 2024-11-19 ENCOUNTER — Ambulatory Visit

## 2024-11-19 ENCOUNTER — Other Ambulatory Visit: Payer: Self-pay

## 2024-11-19 ENCOUNTER — Encounter

## 2024-11-21 ENCOUNTER — Encounter

## 2024-11-21 ENCOUNTER — Ambulatory Visit

## 2024-11-22 ENCOUNTER — Other Ambulatory Visit: Payer: Self-pay

## 2024-11-26 ENCOUNTER — Encounter

## 2024-11-26 ENCOUNTER — Ambulatory Visit

## 2024-11-28 ENCOUNTER — Encounter

## 2024-11-28 ENCOUNTER — Ambulatory Visit

## 2024-12-03 ENCOUNTER — Encounter

## 2024-12-03 ENCOUNTER — Ambulatory Visit

## 2024-12-05 ENCOUNTER — Encounter

## 2024-12-05 ENCOUNTER — Ambulatory Visit

## 2024-12-10 ENCOUNTER — Encounter

## 2024-12-10 ENCOUNTER — Ambulatory Visit

## 2024-12-12 ENCOUNTER — Encounter

## 2024-12-12 ENCOUNTER — Ambulatory Visit

## 2024-12-17 ENCOUNTER — Ambulatory Visit

## 2024-12-17 ENCOUNTER — Encounter

## 2024-12-19 ENCOUNTER — Ambulatory Visit

## 2024-12-19 ENCOUNTER — Encounter

## 2024-12-24 ENCOUNTER — Encounter

## 2024-12-24 ENCOUNTER — Ambulatory Visit

## 2024-12-26 ENCOUNTER — Ambulatory Visit

## 2024-12-26 ENCOUNTER — Encounter
